# Patient Record
Sex: Female | Born: 1965
Health system: Southern US, Community
[De-identification: ages and names within clinical notes are randomized; demographics above are authoritative.]

## PROBLEM LIST (undated history)

## (undated) DIAGNOSIS — Z8489 Family history of other specified conditions: Secondary | ICD-10-CM

## (undated) DIAGNOSIS — Z9889 Other specified postprocedural states: Secondary | ICD-10-CM

## (undated) DIAGNOSIS — E1165 Type 2 diabetes mellitus with hyperglycemia: Secondary | ICD-10-CM

## (undated) DIAGNOSIS — J302 Other seasonal allergic rhinitis: Secondary | ICD-10-CM

## (undated) DIAGNOSIS — E118 Type 2 diabetes mellitus with unspecified complications: Principal | ICD-10-CM

## (undated) DIAGNOSIS — R112 Nausea with vomiting, unspecified: Secondary | ICD-10-CM

## (undated) DIAGNOSIS — E669 Obesity, unspecified: Secondary | ICD-10-CM

## (undated) DIAGNOSIS — L309 Dermatitis, unspecified: Secondary | ICD-10-CM

## (undated) DIAGNOSIS — Z86718 Personal history of other venous thrombosis and embolism: Secondary | ICD-10-CM

## (undated) DIAGNOSIS — M2540 Effusion, unspecified joint: Secondary | ICD-10-CM

## (undated) DIAGNOSIS — I10 Essential (primary) hypertension: Secondary | ICD-10-CM

## (undated) DIAGNOSIS — R05 Cough: Secondary | ICD-10-CM

## (undated) DIAGNOSIS — M2550 Pain in unspecified joint: Secondary | ICD-10-CM

## (undated) DIAGNOSIS — R059 Cough, unspecified: Secondary | ICD-10-CM

## (undated) DIAGNOSIS — K219 Gastro-esophageal reflux disease without esophagitis: Secondary | ICD-10-CM

## (undated) HISTORY — DX: Type 2 diabetes mellitus with unspecified complications: E11.8

## (undated) HISTORY — PX: CHOLECYSTECTOMY: SHX55

## (undated) HISTORY — DX: Gastro-esophageal reflux disease without esophagitis: K21.9

## (undated) HISTORY — DX: Obesity, unspecified: E66.9

## (undated) HISTORY — DX: Type 2 diabetes mellitus with hyperglycemia: E11.65

## (undated) HISTORY — DX: Essential (primary) hypertension: I10

## (undated) HISTORY — PX: ABLATION: SHX5711

---

## 1999-09-08 ENCOUNTER — Ambulatory Visit (HOSPITAL_COMMUNITY): Admission: RE | Admit: 1999-09-08 | Discharge: 1999-09-08 | Payer: Self-pay | Admitting: *Deleted

## 2000-04-30 ENCOUNTER — Other Ambulatory Visit: Admission: RE | Admit: 2000-04-30 | Discharge: 2000-04-30 | Payer: Self-pay | Admitting: Obstetrics and Gynecology

## 2001-05-20 ENCOUNTER — Ambulatory Visit (HOSPITAL_COMMUNITY): Admission: RE | Admit: 2001-05-20 | Discharge: 2001-05-20 | Payer: Self-pay | Admitting: Internal Medicine

## 2001-07-08 ENCOUNTER — Other Ambulatory Visit: Admission: RE | Admit: 2001-07-08 | Discharge: 2001-07-08 | Payer: Self-pay | Admitting: *Deleted

## 2001-10-26 ENCOUNTER — Encounter (INDEPENDENT_AMBULATORY_CARE_PROVIDER_SITE_OTHER): Payer: Self-pay

## 2001-10-26 ENCOUNTER — Ambulatory Visit (HOSPITAL_COMMUNITY): Admission: RE | Admit: 2001-10-26 | Discharge: 2001-10-26 | Payer: Self-pay | Admitting: *Deleted

## 2002-01-19 HISTORY — PX: TUBAL LIGATION: SHX77

## 2003-10-05 ENCOUNTER — Ambulatory Visit: Payer: Self-pay | Admitting: Family Medicine

## 2003-10-16 ENCOUNTER — Ambulatory Visit: Payer: Self-pay | Admitting: Sports Medicine

## 2003-12-24 ENCOUNTER — Encounter (INDEPENDENT_AMBULATORY_CARE_PROVIDER_SITE_OTHER): Payer: Self-pay | Admitting: *Deleted

## 2003-12-24 LAB — CONVERTED CEMR LAB

## 2004-01-02 ENCOUNTER — Ambulatory Visit: Payer: Self-pay | Admitting: Family Medicine

## 2004-01-10 ENCOUNTER — Ambulatory Visit: Payer: Self-pay | Admitting: Gastroenterology

## 2004-01-11 ENCOUNTER — Encounter: Admission: RE | Admit: 2004-01-11 | Discharge: 2004-01-11 | Payer: Self-pay | Admitting: Gastroenterology

## 2004-01-24 ENCOUNTER — Encounter (INDEPENDENT_AMBULATORY_CARE_PROVIDER_SITE_OTHER): Payer: Self-pay | Admitting: *Deleted

## 2004-01-24 ENCOUNTER — Ambulatory Visit: Payer: Self-pay | Admitting: Gastroenterology

## 2004-01-24 ENCOUNTER — Ambulatory Visit (HOSPITAL_COMMUNITY): Admission: RE | Admit: 2004-01-24 | Discharge: 2004-01-24 | Payer: Self-pay | Admitting: Gastroenterology

## 2004-01-25 ENCOUNTER — Ambulatory Visit: Payer: Self-pay | Admitting: Family Medicine

## 2004-02-06 ENCOUNTER — Encounter (INDEPENDENT_AMBULATORY_CARE_PROVIDER_SITE_OTHER): Payer: Self-pay | Admitting: *Deleted

## 2004-02-06 ENCOUNTER — Inpatient Hospital Stay (HOSPITAL_COMMUNITY): Admission: RE | Admit: 2004-02-06 | Discharge: 2004-02-08 | Payer: Self-pay | Admitting: General Surgery

## 2004-03-19 ENCOUNTER — Ambulatory Visit: Payer: Self-pay | Admitting: Family Medicine

## 2004-04-06 ENCOUNTER — Observation Stay (HOSPITAL_COMMUNITY): Admission: EM | Admit: 2004-04-06 | Discharge: 2004-04-07 | Payer: Self-pay | Admitting: Emergency Medicine

## 2004-04-06 ENCOUNTER — Ambulatory Visit: Payer: Self-pay | Admitting: Gastroenterology

## 2004-04-16 ENCOUNTER — Ambulatory Visit: Payer: Self-pay | Admitting: Family Medicine

## 2004-04-30 ENCOUNTER — Ambulatory Visit: Payer: Self-pay | Admitting: Gastroenterology

## 2004-06-04 ENCOUNTER — Ambulatory Visit: Payer: Self-pay | Admitting: Family Medicine

## 2004-07-25 ENCOUNTER — Ambulatory Visit: Payer: Self-pay | Admitting: Family Medicine

## 2004-07-29 ENCOUNTER — Ambulatory Visit (HOSPITAL_COMMUNITY): Admission: RE | Admit: 2004-07-29 | Discharge: 2004-07-29 | Payer: Self-pay | Admitting: Sports Medicine

## 2004-08-20 ENCOUNTER — Ambulatory Visit: Payer: Self-pay | Admitting: Sports Medicine

## 2004-11-21 ENCOUNTER — Ambulatory Visit: Payer: Self-pay | Admitting: Family Medicine

## 2005-02-02 ENCOUNTER — Ambulatory Visit: Payer: Self-pay | Admitting: Family Medicine

## 2005-02-06 ENCOUNTER — Ambulatory Visit: Payer: Self-pay | Admitting: Family Medicine

## 2005-03-11 ENCOUNTER — Emergency Department (HOSPITAL_COMMUNITY): Admission: EM | Admit: 2005-03-11 | Discharge: 2005-03-11 | Payer: Self-pay | Admitting: Family Medicine

## 2005-04-24 ENCOUNTER — Ambulatory Visit: Payer: Self-pay | Admitting: Family Medicine

## 2005-07-10 ENCOUNTER — Ambulatory Visit: Payer: Self-pay | Admitting: Family Medicine

## 2005-09-18 ENCOUNTER — Ambulatory Visit: Payer: Self-pay | Admitting: Family Medicine

## 2005-09-28 ENCOUNTER — Ambulatory Visit: Payer: Self-pay | Admitting: Family Medicine

## 2005-12-15 ENCOUNTER — Ambulatory Visit: Payer: Self-pay | Admitting: Family Medicine

## 2006-01-20 ENCOUNTER — Emergency Department (HOSPITAL_COMMUNITY): Admission: EM | Admit: 2006-01-20 | Discharge: 2006-01-20 | Payer: Self-pay | Admitting: Emergency Medicine

## 2006-03-08 ENCOUNTER — Ambulatory Visit: Payer: Self-pay | Admitting: Family Medicine

## 2006-03-18 DIAGNOSIS — K219 Gastro-esophageal reflux disease without esophagitis: Secondary | ICD-10-CM

## 2006-03-18 DIAGNOSIS — Z6841 Body Mass Index (BMI) 40.0 and over, adult: Secondary | ICD-10-CM

## 2006-03-18 DIAGNOSIS — I10 Essential (primary) hypertension: Secondary | ICD-10-CM

## 2006-03-18 DIAGNOSIS — E669 Obesity, unspecified: Secondary | ICD-10-CM

## 2006-03-18 HISTORY — DX: Gastro-esophageal reflux disease without esophagitis: K21.9

## 2006-03-18 HISTORY — DX: Obesity, unspecified: E66.9

## 2006-03-18 HISTORY — DX: Essential (primary) hypertension: I10

## 2006-03-19 ENCOUNTER — Encounter (INDEPENDENT_AMBULATORY_CARE_PROVIDER_SITE_OTHER): Payer: Self-pay | Admitting: *Deleted

## 2006-05-28 ENCOUNTER — Ambulatory Visit: Payer: Self-pay | Admitting: Family Medicine

## 2006-08-13 ENCOUNTER — Ambulatory Visit: Payer: Self-pay | Admitting: Family Medicine

## 2006-11-08 ENCOUNTER — Telehealth: Payer: Self-pay | Admitting: *Deleted

## 2006-11-10 ENCOUNTER — Ambulatory Visit: Payer: Self-pay | Admitting: Family Medicine

## 2007-02-11 ENCOUNTER — Ambulatory Visit: Payer: Self-pay | Admitting: Family Medicine

## 2007-05-13 ENCOUNTER — Ambulatory Visit: Payer: Self-pay | Admitting: Family Medicine

## 2007-08-12 ENCOUNTER — Ambulatory Visit: Payer: Self-pay | Admitting: Family Medicine

## 2007-11-04 ENCOUNTER — Ambulatory Visit: Payer: Self-pay | Admitting: Family Medicine

## 2008-01-26 ENCOUNTER — Ambulatory Visit: Payer: Self-pay | Admitting: Family Medicine

## 2008-02-06 ENCOUNTER — Encounter: Payer: Self-pay | Admitting: Family Medicine

## 2008-02-06 ENCOUNTER — Ambulatory Visit: Payer: Self-pay | Admitting: Family Medicine

## 2008-02-06 DIAGNOSIS — R7309 Other abnormal glucose: Secondary | ICD-10-CM | POA: Insufficient documentation

## 2008-02-06 LAB — CONVERTED CEMR LAB: Hgb A1c MFr Bld: 10.6 %

## 2008-02-13 ENCOUNTER — Telehealth: Payer: Self-pay | Admitting: *Deleted

## 2008-02-15 LAB — CONVERTED CEMR LAB
ALT: 27 units/L (ref 0–35)
AST: 20 units/L (ref 0–37)
BUN: 8 mg/dL (ref 6–23)
Calcium: 9 mg/dL (ref 8.4–10.5)
Chloride: 104 meq/L (ref 96–112)
Creatinine, Ser: 0.77 mg/dL (ref 0.40–1.20)
HDL: 42 mg/dL (ref >39)
Total Bilirubin: 0.4 mg/dL (ref 0.3–1.2)
Total CHOL/HDL Ratio: 3.5
VLDL: 23 mg/dL (ref 0–40)

## 2008-03-22 ENCOUNTER — Ambulatory Visit: Payer: Self-pay | Admitting: Family Medicine

## 2008-03-22 ENCOUNTER — Encounter: Payer: Self-pay | Admitting: Family Medicine

## 2008-04-20 ENCOUNTER — Ambulatory Visit: Payer: Self-pay | Admitting: Family Medicine

## 2008-07-13 ENCOUNTER — Ambulatory Visit: Payer: Self-pay | Admitting: Family Medicine

## 2008-07-13 DIAGNOSIS — R300 Dysuria: Secondary | ICD-10-CM | POA: Insufficient documentation

## 2008-07-13 LAB — CONVERTED CEMR LAB
Bilirubin Urine: NEGATIVE
Glucose, Urine, Semiquant: NEGATIVE
Urobilinogen, UA: 0.2
WBC Urine, dipstick: NEGATIVE
pH: 6

## 2008-08-15 ENCOUNTER — Ambulatory Visit: Payer: Self-pay | Admitting: Family Medicine

## 2008-08-15 ENCOUNTER — Encounter: Payer: Self-pay | Admitting: Family Medicine

## 2008-08-20 ENCOUNTER — Telehealth: Payer: Self-pay | Admitting: *Deleted

## 2008-08-20 LAB — CONVERTED CEMR LAB
CO2: 20 meq/L (ref 19–32)
Calcium: 9.2 mg/dL (ref 8.4–10.5)
Creatinine, Ser: 0.92 mg/dL (ref 0.40–1.20)
Glucose, Bld: 366 mg/dL — ABNORMAL HIGH (ref 70–99)

## 2008-08-21 ENCOUNTER — Encounter: Payer: Self-pay | Admitting: Family Medicine

## 2008-08-24 ENCOUNTER — Ambulatory Visit: Payer: Self-pay | Admitting: Family Medicine

## 2008-08-24 DIAGNOSIS — E1165 Type 2 diabetes mellitus with hyperglycemia: Secondary | ICD-10-CM | POA: Insufficient documentation

## 2008-08-24 DIAGNOSIS — IMO0002 Reserved for concepts with insufficient information to code with codable children: Secondary | ICD-10-CM

## 2008-08-24 HISTORY — DX: Type 2 diabetes mellitus with hyperglycemia: E11.65

## 2008-08-24 HISTORY — DX: Reserved for concepts with insufficient information to code with codable children: IMO0002

## 2008-08-24 LAB — CONVERTED CEMR LAB

## 2008-08-29 ENCOUNTER — Telehealth: Payer: Self-pay | Admitting: *Deleted

## 2008-10-03 ENCOUNTER — Ambulatory Visit: Payer: Self-pay | Admitting: Family Medicine

## 2009-01-03 ENCOUNTER — Telehealth: Payer: Self-pay | Admitting: *Deleted

## 2009-01-25 ENCOUNTER — Ambulatory Visit: Payer: Self-pay | Admitting: Family Medicine

## 2009-01-25 ENCOUNTER — Encounter: Payer: Self-pay | Admitting: Family Medicine

## 2009-01-25 DIAGNOSIS — R079 Chest pain, unspecified: Secondary | ICD-10-CM | POA: Insufficient documentation

## 2009-01-25 LAB — CONVERTED CEMR LAB
Albumin/Creatinine Ratio, Urine, POC: <30
CO2: 24 meq/L (ref 19–32)
Chloride: 99 meq/L (ref 96–112)
Glucose, Bld: 88 mg/dL
Potassium: 4 meq/L (ref 3.5–5.3)
Sodium: 136 meq/L (ref 135–145)

## 2009-02-26 ENCOUNTER — Telehealth: Payer: Self-pay | Admitting: *Deleted

## 2009-02-27 ENCOUNTER — Encounter: Payer: Self-pay | Admitting: Family Medicine

## 2009-04-15 ENCOUNTER — Ambulatory Visit: Payer: Self-pay | Admitting: Family Medicine

## 2009-06-20 ENCOUNTER — Encounter: Payer: Self-pay | Admitting: *Deleted

## 2009-06-20 ENCOUNTER — Telehealth: Payer: Self-pay | Admitting: *Deleted

## 2009-06-27 ENCOUNTER — Ambulatory Visit: Payer: Self-pay | Admitting: Family Medicine

## 2009-06-27 LAB — CONVERTED CEMR LAB: Hgb A1c MFr Bld: 6.7 %

## 2009-07-12 ENCOUNTER — Ambulatory Visit: Payer: Self-pay | Admitting: Family Medicine

## 2009-10-07 ENCOUNTER — Encounter: Payer: Self-pay | Admitting: Family Medicine

## 2009-10-07 ENCOUNTER — Ambulatory Visit: Payer: Self-pay | Admitting: Family Medicine

## 2009-10-07 DIAGNOSIS — R5383 Other fatigue: Secondary | ICD-10-CM

## 2009-10-07 DIAGNOSIS — R5381 Other malaise: Secondary | ICD-10-CM | POA: Insufficient documentation

## 2009-10-07 LAB — CONVERTED CEMR LAB
Albumin/Creatinine Ratio, Urine, POC: <30
Creatinine,U: 100 mg/dL
Hgb A1c MFr Bld: 6.8 %
Microalbumin U total vol: 10 mg/L

## 2009-10-09 LAB — CONVERTED CEMR LAB: TSH: 1.858 microintl units/mL (ref 0.350–4.500)

## 2009-12-31 ENCOUNTER — Ambulatory Visit: Payer: Self-pay | Admitting: Family Medicine

## 2009-12-31 ENCOUNTER — Encounter: Payer: Self-pay | Admitting: Family Medicine

## 2009-12-31 LAB — CONVERTED CEMR LAB
BUN: 10 mg/dL (ref 6–23)
CO2: 24 meq/L (ref 19–32)
Creatinine, Ser: 0.9 mg/dL (ref 0.40–1.20)
Glucose, Bld: 107 mg/dL — ABNORMAL HIGH (ref 70–99)
Sodium: 137 meq/L (ref 135–145)
Total Bilirubin: 0.3 mg/dL (ref 0.3–1.2)
Total Protein: 6.8 g/dL (ref 6.0–8.3)

## 2010-02-04 ENCOUNTER — Encounter: Payer: Self-pay | Admitting: *Deleted

## 2010-02-04 ENCOUNTER — Encounter: Payer: Self-pay | Admitting: Sports Medicine

## 2010-02-04 ENCOUNTER — Ambulatory Visit
Admission: RE | Admit: 2010-02-04 | Discharge: 2010-02-04 | Payer: Self-pay | Source: Home / Self Care | Attending: Family Medicine | Admitting: Family Medicine

## 2010-02-04 DIAGNOSIS — J029 Acute pharyngitis, unspecified: Secondary | ICD-10-CM | POA: Insufficient documentation

## 2010-02-04 LAB — CONVERTED CEMR LAB: Rapid Strep: NEGATIVE

## 2010-02-12 ENCOUNTER — Ambulatory Visit: Admission: RE | Admit: 2010-02-12 | Discharge: 2010-02-12 | Payer: Self-pay | Source: Home / Self Care

## 2010-02-20 NOTE — Assessment & Plan Note (Signed)
Summary: sorethroat/feel like its closing/smith/bmc   Vital Signs:  Patient profile:   45 year old female Height:      60 inches Weight:      253.7 pounds BMI:     49.73 Temp:     98.8 degrees F oral Pulse rate:   100 / minute BP sitting:   127 / 85  (left arm) Cuff size:   large  Vitals Entered By: Jimmy Footman, CMA (February 04, 2010 1:39 PM) CC: cold sxs x1week, nausea & ST x2 days Is Patient Diabetic? Yes Did you bring your meter with you today? No   Primary Care Provider:  Antoine Primas DO  CC:  cold sxs x1week and nausea & ST x2 days.  History of Present Illness: URI Symptoms Onset: 2d Description: ST, nausea Symptoms better with phenergan.  NO rashes.  Symptoms Nasal discharge: minimal Fever: No, subjective chills Sore throat: YES, severe Cough: minimal Wheezing: NO Ear pain: NO GI symptoms: Vomiting x3, NB/NB, last ate today. One episode diarrhea.  No abd pain. Sick contacts: son with URI  Red Flags  Stiff neck: NO Dyspnea: NO Rash: NO Swallowing difficulty: NO  Sinusitis Risk Factors Headache/face pain: NO Double sickening: NO tooth pain: NO  Allergy Risk Factors Sneezing: NO Itchy scratchy throat: NO Seasonal symptoms: NO  Flu Risk Factors Headache: NO muscle aches: NO severe fatigue: NO    Habits & Providers  Alcohol-Tobacco-Diet     Tobacco Status: never  Current Medications (verified): 1)  Calcium 600+d 600-400 Mg-Unit Tabs (Calcium Carbonate-Vitamin D) .Marland Kitchen.. 1 Tab By Mouth Two Times A Day 2)  Lisinopril-Hydrochlorothiazide 20-25 Mg Tabs (Lisinopril-Hydrochlorothiazide) .... Take 1 Pill By Mouth Daily 3)  Lancets  Misc (Lancets) .... Check Blood Sugar Two Times A Day 4)  Onetouch Basic System W/device Kit (Blood Glucose Monitoring Suppl) .... Check Blood Sugar Two Times A Day 5)  Omeprazole 20 Mg Cpdr (Omeprazole) .... Take 1 Tab By Mouth Once Daily 6)  Fexofenadine Hcl 60 Mg Tabs (Fexofenadine Hcl) .... Take 1 Tab By Mouth Daily 7)   Metformin Hcl 1000 Mg Tabs (Metformin Hcl) .... Take 1 Tab By Mouth Two Times A Day 8)  Glipizide 5 Mg Xr24h-Tab (Glipizide) .... Take 1 Tab Daily 9)  Promethazine Hcl 25 Mg Tabs (Promethazine Hcl) .... Take 1 Tab By Mouth Q 8 Hrs As Needed For Nausea  Allergies (verified): No Known Drug Allergies  Review of Systems       See HPI  Physical Exam  General:  Well-developed,well-nourished,in no acute distress; alert,appropriate and cooperative throughout examination Head:  Normocephalic and atraumatic without obvious abnormalities. No apparent alopecia or balding. Eyes:  No corneal or conjunctival inflammation noted. EOMI. Perrl Ears:  External ear exam shows no significant lesions or deformities.  Otoscopic examination reveals clear canals, tympanic membranes are intact bilaterally without bulging, retraction, inflammation or discharge. Hearing is grossly normal bilaterally. Nose:  External nasal examination shows no deformity or inflammation. Nasal mucosa are pink and moist without lesions or exudates. Mouth:  Oropharynx markedly erythematous with swollen tonsils without exudate. Neck:  No deformities, masses, or tenderness noted. Lungs:  Normal respiratory effort, chest expands symmetrically. Lungs are clear to auscultation, no crackles or wheezes. Heart:  Normal rate and regular rhythm. S1 and S2 normal without gallop, murmur, click, rub or other extra sounds. Abdomen:  Bowel sounds positive,abdomen soft and non-tender without masses, organomegaly or hernias noted.   Impression & Recommendations:  Problem # 1:  SORE THROAT (ICD-462) Assessment  New Viral syndrome with neg strep. Toradol 30 in office. Pulse initially 120 but down to 100. Home care handout given. Phenergan/Zofran for nausea. Push by mouth fluids. Mobic for pain.  Orders: Rapid Strep-[facility] ([address]) [facility]- Est  Level 4 ([address])  Her updated medication list for this problem includes:    Mobic 15 Mg Tabs (Meloxicam)  ..... One tab by mouth daily for throat pain.  Complete Medication List: 1)  Calcium 600+d 600-400 Mg-unit Tabs (Calcium carbonate-vitamin d) .Marland Kitchen.. 1 tab by mouth two times a day 2)  Lisinopril-hydrochlorothiazide 20-25 Mg Tabs (Lisinopril-hydrochlorothiazide) .... Take 1 pill by mouth daily 3)  Lancets Misc (Lancets) .... Check blood sugar two times a day 4)  Onetouch Basic System W/device Kit (Blood glucose monitoring suppl) .... Check blood sugar two times a day 5)  Omeprazole 20 Mg Cpdr (Omeprazole) .... Take 1 tab by mouth once daily 6)  Fexofenadine Hcl 60 Mg Tabs (Fexofenadine hcl) .... Take 1 tab by mouth daily 7)  Metformin Hcl 1000 Mg Tabs (Metformin hcl) .... Take 1 tab by mouth two times a day 8)  Glipizide 5 Mg Xr24h-tab (Glipizide) .... Take 1 tab daily 9)  Promethazine Hcl 25 Mg Tabs (Promethazine hcl) .... Take 1 tab by mouth q 8 hrs as needed for nausea 10)  Mobic 15 Mg Tabs (Meloxicam) .... One tab by mouth daily for throat pain. 11)  Zofran Odt 8 Mg Tbdp (Ondansetron) .... One tab by mouth q8h as needed for nausea  Patient Instructions: 1)  Mobic for pain. 2)  Zofran and phenergan for nausea. 3)  -Dr. Karie Schwalbe. Prescriptions: ZOFRAN ODT 8 MG TBDP (ONDANSETRON) One tab by mouth q8h as needed for nausea  #20 x 0   Entered and Authorized by:   Rodney Langton MD   Signed by:   Rodney Langton MD on 02/04/2010   Method used:   Electronically to        [facility] [phone]* (retail)       [address]       [place], [place]  [address]       Ph: [phone]       Fax: [phone]   RxID:   [id number] MOBIC 15 MG TABS (MELOXICAM) One tab by mouth daily for throat pain.  #30 x 0   Entered and Authorized by:   Rodney Langton MD   Signed by:   Rodney Langton MD on 02/04/2010   Method used:   Electronically to        [facility] [phone]* (retail)       [address]       [place], [place]  [address]       Ph: [phone]       Fax:  [phone]   RxID:   [id number]    Orders Added: 1)  Rapid Strep-[facility] [[address]] 2)  [facility]- Est  Level 4 [35573]    Laboratory Results  Date/Time Received: February 04, 2010 1:42 PM  Date/Time Reported: February 04, 2010 1:54 PM   Other Tests  Rapid Strep: negative Comments: ...............test performed by......Marland KitchenBonnie A. [place]az[place], MLS (ASCP)cm    Appended Document: sorethroat/feel like its closing/smith/bmc   Medication Administration  Injection # 1:    Medication: Ketorolac-Toradol 15mg     Diagnosis: SORE THROAT (ICD-462)    Route: IM    Site: R deltoid    Exp Date: 05/20/2011    Lot #: [id number]    Mfr: [email or web address]    Patient tolerated injection  without complications    Given by: Jimmy Footman, CMA (February 04, 2010 4:26 PM)  Orders Added: 1)  Ketorolac-Toradol 15mg  [F8101]

## 2010-02-20 NOTE — Assessment & Plan Note (Signed)
Summary: depo inj,tcb  Nurse Visit   Allergies: No Known Drug Allergies  Medication Administration  Injection # 1:    Medication: Depo-Provera 150mg     Diagnosis: CONTRACEPTIVE MANAGEMENT (ICD-V25.09)    Route: IM    Site: R deltoid    Exp Date: 02/2012    Lot #: [id number]    Mfr: [email or web address]    Comments: next depo due Sept 9 thru Sept 23, 2011    Patient tolerated injection without complications    Given by: Theresia Lo RN (July 12, 2009 3:29 PM)  Orders Added: 1)  Depo-Provera 150mg  [J1055] 2)  Admin of Injection (IM/SQ) [04540]   Medication Administration  Injection # 1:    Medication: Depo-Provera 150mg     Diagnosis: CONTRACEPTIVE MANAGEMENT (ICD-V25.09)    Route: IM    Site: R deltoid    Exp Date: 02/2012    Lot #: [id number]    Mfr: [email or web address]    Comments: next depo due Sept 9 thru Sept 23, 2011    Patient tolerated injection without complications    Given by: Theresia Lo RN (July 12, 2009 3:29 PM)  Orders Added: 1)  Depo-Provera 150mg  [J1055] 2)  Admin of Injection (IM/SQ) [47829]

## 2010-02-20 NOTE — Letter (Signed)
Summary: Out of Work  [facility]  [address]   [place], [place] [address]   Phone: [phone]  Fax: [phone]    February 04, 2010   Employee:  Margarit A Chura    To Whom It May Concern:   For Medical reasons, please excuse the above named employee from work for the following dates:  February 04, 2010  If you need additional information, please feel free to contact our office.         Sincerely,    Jimmy Footman, CMA

## 2010-02-20 NOTE — Letter (Signed)
Summary: Generic Letter  [facility]  [address]   [place], [place] [address]   Phone: [phone]  Fax: [phone]    06/20/2009  Janeil Stief [address] [place], [place]  [address]  Dear Ms. Berne,  I have been unable to reach you by phone. We need to get you in to see your md. it has been 6 months. when you have diabtes, we usually see you every 3 months. I refilled the bp med for 2 weeks only. Please call asap to make an appt.         Sincerely,   Golden Circle RN

## 2010-02-20 NOTE — Assessment & Plan Note (Signed)
Summary: f/u DM, HTN, obese, contraception   Vital Signs:  Patient profile:   45 year old female Height:      60 inches Weight:      265 pounds BMI:     51.94 BSA:     2.11 Temp:     98.5 degrees F Pulse rate:   105 / minute BP sitting:   127 / 84  Vitals Entered By: Jone Baseman CMA (June 27, 2009 4:25 PM) [facility]: f/u Is Patient Diabetic? Yes Did you bring your meter with you today? No Pain Assessment Patient in pain? no        Primary Care Provider:  Antoine Primas DO  CC:  f/u.  History of Present Illness: Pt is here for follow up on diabetes.   Pt brought her blood sugar monitor and the vast majority of the readings have been around 80's to 160's.  Pt states the last two weeks has been having more sweets and not watching her diet as much due to celebrating her son graduating from high school.  Having mild gi side effect s from the medications but tolerating them well.   Pt BP is well controlled and she deies any headaches, abdominal pain   Pt weight was addressed that she has gained 3 #.  Pt states she has started to walk but is doing it sporadicallyand for only a total of 15- 20 minutes walks what seems to be a little over a half mile.    Pt is also here for her Depo shot.  Pt states that she likes her depo and does not want to switch to another contraception at this moment. Pt told of other alternatives that would be better for her weight and bone health  Pt also complains of runny nose happens seasonally, this time of the year seems to be worse, right now having a little left ear pain as well, feels that there is fluid in them   Pt also states she is having a little more heartburn recently, used to be on a medication that helped and would like it again  Habits & Providers  Alcohol-Tobacco-Diet     Tobacco Status: never  Current Medications (verified): 1)  Calcium 600+d 600-400 Mg-Unit Tabs (Calcium Carbonate-Vitamin D) .Marland Kitchen.. 1 Tab By Mouth Two Times A Day 2)   Lisinopril-Hydrochlorothiazide 20-25 Mg Tabs (Lisinopril-Hydrochlorothiazide) .... Take 1 Pill By Mouth Daily 3)  Metformin Hcl 850 Mg Tabs (Metformin Hcl) .... Take 1 Tab By Mouth Two Times A Day 4)  Glyburide 5 Mg Tabs (Glyburide) .... Take 1 Tab By Conley Simmonds Time Daily 5)  Lancets  Misc (Lancets) .... Check Blood Sugar Two Times A Day 6)  Onetouch Basic System W/device Kit (Blood Glucose Monitoring Suppl) .... Check Blood Sugar Two Times A Day 7)  Omeprazole 20 Mg Cpdr (Omeprazole) .... Take 1 Tab By Mouth Once Daily 8)  Fexofenadine Hcl 60 Mg Tabs (Fexofenadine Hcl) .... Take 1 Tab By Mouth Daily  Allergies (verified): No Known Drug Allergies  Past History:  Past medical, surgical, family and social histories (including risk factors) reviewed, and no changes noted (except as noted below).  Past Medical History: Reviewed history from 03/18/2006 and no changes required. Hyperglycemia 3/06 [facility] inpatient, MRSA  Past Surgical History: Reviewed history from 03/18/2006 and no changes required. BTL -2002 - 08/04/2004, Cholecystectomy - 01/20/2004, ERCP- CBD stone, S/P Balloon sweeps with N cholangiogram - 04/11/2004, Serum glucose -163,  Hb Aic 6.6 - 08/04/2004, TV US:  0.9 cm myometrial fundal fibroid - 08/04/2004  Family History: Reviewed history from 08/24/2008 and no changes required. Breast CA 1st degree, Colon Ca - paternal grandfather, HTN - parents  Social History: Reviewed history from 08/24/2008 and no changes required. Single. Lives with 69 y/o son. working. No smoking. Occ. Alcohol use. No STDs nor Iv drug use. Fast-food diet only sporadic exercise  Review of Systems       denies fever, chills, nausea, vomiting, diarrhea or constipation   Physical Exam  General:  Well-developed,well-nourished,in no acute distress; alert,appropriate and cooperative throughout examination Eyes:  No corneal or conjunctival inflammation noted. EOMI. Perrla.  Ears:  mild fluid level seen behind  left TM no erythema no bulging or retraction Nose:  mild blue hue to turbinates Mouth:  MMM Lungs:  Normal respiratory effort, chest expands symmetrically. Lungs are clear to auscultation, no crackles or wheezes. Heart:  Normal rate and regular rhythm. S1 and S2 normal without gallop, murmur, click, rub or other extra sounds. Abdomen:  Bowel sounds positive,abdomen soft and non-tender without masses, organomegaly or hernias noted. Extremities:  trace edema Le b/l Neurologic:  NVI in all extremities Skin:  nno rash  Diabetes Management Exam:    Foot Exam (with socks and/or shoes not present):       Sensory-Pinprick/Light touch:          Left medial foot (L-4): normal          Left dorsal foot (L-5): normal          Left lateral foot (S-1): normal          Right medial foot (L-4): normal          Right dorsal foot (L-5): normal          Right lateral foot (S-1): normal       Sensory-Monofilament:          Left foot: normal          Right foot: normal       Inspection:          Left foot: normal          Right foot: normal       Nails:          Left foot: normal          Right foot: normal    Eye Exam:       Eye Exam done elsewhere          Date: 06/19/2009          Results: normal          Done by: optho   Impression & Recommendations:  Problem # 1:  DIAB W/UNSPEC COMP TYPE II/UNSPEC TYPE UNCNTRL (ICD-250.92) Assessment Unchanged Pt did have mild elevation in A1C, consider incresing metformin to 1000mg  two times a day but pt states she will do better and would like to avoid more gi side effects.  Will see again in 3 months.  Pt was seen by optho and will see a podiatrist Her updated medication list for this problem includes:    Lisinopril-hydrochlorothiazide 20-25 Mg Tabs (Lisinopril-hydrochlorothiazide) .Marland Kitchen... Take 1 pill by mouth daily    Metformin Hcl 850 Mg Tabs (Metformin hcl) .Marland Kitchen... Take 1 tab by mouth two times a day    Glyburide 5 Mg Tabs (Glyburide) .Marland Kitchen... Take 1 tab by  mouth1 time daily  Orders: A1C-[facility] ([address]) [facility]- Est  Level 4 ([address])  Problem # 2:  OBESITY, NOS (ICD-278.00) Still  gaining weight, want to increase activity because pt could not pick any foods she wanted to take out of her diet.  Pt states she will do better, made aware Depo is also making this more difficult.  Goal to lose 5# by next visit in 3 months. Orders: [facility]- Est  Level 4 ([address])  Problem # 3:  HYPERTENSION, BENIGN SYSTEMIC (ICD-401.1) Assessment: Unchanged Pt at goal will continue current tx.  Will need labwork done at next appointment.  Likely should get lipids as well.  Her updated medication list for this problem includes:    Lisinopril-hydrochlorothiazide 20-25 Mg Tabs (Lisinopril-hydrochlorothiazide) .Marland Kitchen... Take 1 pill by mouth daily  Orders: [facility]- Est  Level 4 ([address])  Problem # 4:  CONTRACEPTIVE MANAGEMENT (ICD-V25.09) Assessment: Comment Only Pt given many alternatives, she likes the idea of not having periods, will reasses at next visit.  Pt given all risks of long term tx of depo.  Should get Vit D level at next visit and consider early Dexa Scan.    Problem # 5:  GASTROESOPHAGEAL REFLUX, NO ESOPHAGITIS (ICD-530.81) Assessment: Deteriorated will start omeprazole, f/u in 3 months if continue jwill check for H pylori Her updated medication list for this problem includes:    Omeprazole 20 Mg Cpdr (Omeprazole) .Marland Kitchen... Take 1 tab by mouth once daily  Complete Medication List: 1)  Calcium 600+d 600-400 Mg-unit Tabs (Calcium carbonate-vitamin d) .Marland Kitchen.. 1 tab by mouth two times a day 2)  Lisinopril-hydrochlorothiazide 20-25 Mg Tabs (Lisinopril-hydrochlorothiazide) .... Take 1 pill by mouth daily 3)  Metformin Hcl 850 Mg Tabs (Metformin hcl) .... Take 1 tab by mouth two times a day 4)  Glyburide 5 Mg Tabs (Glyburide) .... Take 1 tab by mouth1 time daily 5)  Lancets Misc (Lancets) .... Check blood sugar two times a day 6)  Onetouch Basic System W/device Kit (Blood glucose  monitoring suppl) .... Check blood sugar two times a day 7)  Omeprazole 20 Mg Cpdr (Omeprazole) .... Take 1 tab by mouth once daily 8)  Fexofenadine Hcl 60 Mg Tabs (Fexofenadine hcl) .... Take 1 tab by mouth daily  Patient Instructions: 1)  You are doing great A1C was 6.7%! 2)  I need to see you again in 3 months 3)  All medications were refilled Prescriptions: FEXOFENADINE HCL 60 MG TABS (FEXOFENADINE HCL) take 1 tab by mouth daily  #96 x 3   Entered and Authorized by:   Antoine Primas DO   Signed by:   Antoine Primas DO on 06/27/2009   Method used:   Electronically to        [facility] [phone]* (retail)       [address]       [place], [place]  [address]       Ph: [phone]       Fax: [phone]   RxID:   [id number] OMEPRAZOLE 20 MG CPDR (OMEPRAZOLE) take 1 tab by mouth once daily  #96 x 3   Entered and Authorized by:   Antoine Primas DO   Signed by:   Antoine Primas DO on 06/27/2009   Method used:   Electronically to        [facility] [phone]* (retail)       [address]       [place], [place]  [address]       Ph: [phone]       Fax: [phone]   RxID:   [id number] LANCETS  MISC (LANCETS) check blood sugar two times  a day  #1box x 5   Entered and Authorized by:   Antoine Primas DO   Signed by:   Antoine Primas DO on 06/27/2009   Method used:   Electronically to        [facility] [phone]* (retail)       [address]       [place], [place]  [address]       Ph: [phone]       Fax: [phone]   RxID:   [id number] GLYBURIDE 5 MG TABS (GLYBURIDE) take 1 tab by mouth1 time daily  #96 x 3   Entered and Authorized by:   Antoine Primas DO   Signed by:   Antoine Primas DO on 06/27/2009   Method used:   Electronically to        [facility] [phone]* (retail)       [address]       [place], [place]  [address]       Ph: [phone]       Fax: [phone]   RxID:   [id number] METFORMIN HCL 850 MG TABS (METFORMIN HCL)  take 1 tab by mouth two times a day  #192 x 3   Entered and Authorized by:   Antoine Primas DO   Signed by:   Antoine Primas DO on 06/27/2009   Method used:   Electronically to        [facility] [phone]* (retail)       [address]       [place], [place]  [address]       Ph: [phone]       Fax: [phone]   RxID:   [id number] LISINOPRIL-HYDROCHLOROTHIAZIDE 20-25 MG TABS (LISINOPRIL-HYDROCHLOROTHIAZIDE) Take 1 pill by mouth daily  #96 x 3   Entered and Authorized by:   Antoine Primas DO   Signed by:   Antoine Primas DO on 06/27/2009   Method used:   Electronically to        [facility] [phone]* (retail)       [address]       [place], [place]  [address]       Ph: [phone]       Fax: [phone]   RxID:   [id number]   Laboratory Results   Blood Tests   Date/Time Received: June 27, 2009 4:18 PM  Date/Time Reported: June 27, 2009 4:40 PM   HGBA1C: 6.7%   (Normal Range: Non-Diabetic - 3-6%   Control Diabetic - 6-8%)  Comments: ...............test performed by......Marland KitchenBonnie A. [place], MLS (ASCP)cm      Prevention & Chronic Care Immunizations   Influenza vaccine: Not documented   Influenza vaccine due: 09/19/2009    Tetanus booster: 07/24/2004: Done.   Tetanus booster due: 07/25/2014    Pneumococcal vaccine: Not documented  Other Screening   Pap smear: normal  (02/06/2008)   Pap smear due: 02/05/2010    Mammogram: Not documented   Smoking status: never  (06/27/2009)  Diabetes Mellitus   HgbA1C: 6.7  (06/27/2009)   Hemoglobin A1C due: 05/06/2008    Eye exam: normal  (06/19/2009)   Eye exam due: 06/2010    Foot exam: yes  (06/27/2009)   High risk foot: Not documented   Foot care education: Not documented    Urine microalbumin/creatinine ratio: Not documented    Diabetes flowsheet reviewed?: Yes   Progress toward A1C goal: At goal    Stage of readiness to change (  diabetes management): Maintenance   Diabetes comments: goal  is <7  Lipids   Total Cholesterol: 146  (02/06/2008)   LDL: 81  (02/06/2008)   LDL Direct: 84  (01/25/2009)   HDL: 42  (02/06/2008)   Triglycerides: 114  (02/06/2008)   Lipid panel due: 12/27/2009  Hypertension   Last Blood Pressure: 127 / 84  (06/27/2009)   Serum creatinine: 0.80  (01/25/2009)   Serum potassium 4.0  (01/25/2009)    Hypertension flowsheet reviewed?: Yes   Progress toward BP goal: At goal    Stage of readiness to change (hypertension management): Maintenance  Self-Management Support :    Diabetes self-management support: Copy of home glucose meter record, Pre-printed educational material, Referred for self-management class  (06/27/2009)    Hypertension self-management support: BP self-monitoring log, Written self-care plan, Pre-printed educational material  (06/27/2009)   Hypertension self-care plan printed.

## 2010-02-20 NOTE — Assessment & Plan Note (Signed)
Summary: f/u diabetes   Vital Signs:  Patient profile:   45 year old female Height:      60 inches Weight:      262.6 pounds BMI:     51.47 Temp:     98.2 degrees F oral Pulse rate:   99 / minute BP sitting:   114 / 77  (right arm) Cuff size:   large  Vitals Entered By: Garen Grams LPN (January 25, 2009 2:33 PM) CC: f/u dm, Hypertension Management Is Patient Diabetic? Yes Did you bring your meter with you today? No Pain Assessment Patient in pain? no        Primary Care Provider:  Antoine Primas DO  CC:  f/u dm and Hypertension Management.  History of Present Illness: Pt is here for 6 month follow up on new onset of diabetes.   Pt brought her blood sugar monitor and the vast majority of the readings have been around 89-111 with only a few outliers above 140 none above 150. Pt is very happy with her progress and is not having any gi side effect s from the medications.  Pt BP is well controlled and she deies any headaches.  Pt weight was addressed that she has gained 1 #.  Pt states she has started to walk but is doing it sporadicallyand for only a total of 15 minutes.    Pt is also here for her Depo shot.  Pt states that she likes her depo and does not want to switch to another contraception at this moment.  Pt also complains of runny nose happens seasonally, this time of the year seems to be worse.    Hypertension History:      She denies headache, chest pain, palpitations, and visual symptoms.  She notes no problems with any antihypertensive medication side effects.        Positive major cardiovascular risk factors include diabetes and hypertension.  Negative major cardiovascular risk factors include female age less than 41 years old, no history of hyperlipidemia, negative family history for ischemic heart disease, and non-tobacco-user status.        Further assessment for target organ damage reveals no history of ASHD, cardiac end-organ damage (CHF/LVH), stroke/TIA,  peripheral vascular disease, renal insufficiency, or hypertensive retinopathy.      Habits & Providers  Alcohol-Tobacco-Diet     Tobacco Status: never  Current Medications (verified): 1)  Calcium 600+d 600-400 Mg-Unit Tabs (Calcium Carbonate-Vitamin D) .Marland Kitchen.. 1 Tab By Mouth Two Times A Day 2)  Lisinopril-Hydrochlorothiazide 20-25 Mg Tabs (Lisinopril-Hydrochlorothiazide) .... Take 1 Pill By Mouth Daily 3)  Metformin Hcl 850 Mg Tabs (Metformin Hcl) .... Take 1 Tab By Mouth Two Times A Day 4)  Glyburide 5 Mg Tabs (Glyburide) .... Take 1 Tab By Conley Simmonds Time Daily 5)  Lancets  Misc (Lancets) .... Check Blood Sugar Two Times A Day 6)  Onetouch Basic System W/device Kit (Blood Glucose Monitoring Suppl) .... Check Blood Sugar Two Times A Day  Allergies (verified): No Known Drug Allergies  Past History:  Past medical, surgical, family and social histories (including risk factors) reviewed, and no changes noted (except as noted below).  Past Medical History: Reviewed history from 03/18/2006 and no changes required. Hyperglycemia 3/06 [facility] inpatient, MRSA  Past Surgical History: Reviewed history from 03/18/2006 and no changes required. BTL -2002 - 08/04/2004, Cholecystectomy - 01/20/2004, ERCP- CBD stone, S/P Balloon sweeps with N cholangiogram - 04/11/2004, Serum glucose -163,  Hb Aic 6.6 - 08/04/2004, TV  US: 0.9 cm myometrial fundal fibroid - 08/04/2004  Family History: Reviewed history from 08/24/2008 and no changes required. Breast CA 1st degree, Colon Ca - paternal grandfather, HTN - parents  Social History: Reviewed history from 08/24/2008 and no changes required. Single. Lives with 66  y/o son. Unemployed since Jan 2005. No smoking. Occ. Alcohol use. No STDs nor Iv drug use. Fast-food diet. Walks 30 min 3x/ wk  Review of Systems       denies fever, chills, nausea, vomiting, diarrhea or constipation   Physical Exam  General:  Well-developed,well-nourished,in no acute distress;  alert,appropriate and cooperative throughout examination Nose:  bluish turbinates, possible polyp on right side, non obstructing Lungs:  Normal respiratory effort, chest expands symmetrically. Lungs are clear to auscultation, no crackles or wheezes. Heart:  Normal rate and regular rhythm. S1 and S2 normal without gallop, murmur, click, rub or other extra sounds. Abdomen:  Bowel sounds positive,abdomen soft and non-tender without masses, organomegaly or hernias noted. Neurologic:  No cranial nerve deficits noted.  DTRs are symmetrical throughout. Sensory, motor and coordinative functions appear intact.  Diabetes Management Exam:    Eye Exam:       Eye Exam done elsewhere          Date: 08/02/2008          Results: normal          Done by: dr   Impression & Recommendations:  Problem # 1:  DIAB W/UNSPEC COMP TYPE II/UNSPEC TYPE UNCNTRL (ICD-250.92) Assessment Improved Doing Great!  At goal for A1C.  Decreased glyburide this time, will attempt to stop it if she is doing well next time Her updated medication list for this problem includes:    Lisinopril-hydrochlorothiazide 20-25 Mg Tabs (Lisinopril-hydrochlorothiazide) .Marland Kitchen... Take 1 pill by mouth daily    Metformin Hcl 850 Mg Tabs (Metformin hcl) .Marland Kitchen... Take 1 tab by mouth two times a day    Glyburide 5 Mg Tabs (Glyburide) .Marland Kitchen... Take 1 tab by mouth1 time daily  Orders: A1C-[facility] (13086) UA Microalbumin-[facility] (57846) Basic Met-[facility] (96295-28413) Direct LDL-[facility] (24401-02725) [facility]- Est  Level 4 (36644)  Her updated medication list for this problem includes:    Lisinopril-hydrochlorothiazide 20-25 Mg Tabs (Lisinopril-hydrochlorothiazide) .Marland Kitchen... Take 1 pill by mouth daily    Metformin Hcl 850 Mg Tabs (Metformin hcl) .Marland Kitchen... Take 1 tab by mouth two times a day    Glyburide 5 Mg Tabs (Glyburide) .Marland Kitchen... Take 1 tab by mouth1 time daily  Problem # 2:  OBESITY, NOS (ICD-278.00) Assessment: Unchanged  Orders: [facility]- Est  Level 4 (03474)  Problem # 3:   HYPERTENSION, BENIGN SYSTEMIC (ICD-401.1) At Goal Her updated medication list for this problem includes:    Lisinopril-hydrochlorothiazide 20-25 Mg Tabs (Lisinopril-hydrochlorothiazide) .Marland Kitchen... Take 1 pill by mouth daily  Her updated medication list for this problem includes:    Lisinopril-hydrochlorothiazide 20-25 Mg Tabs (Lisinopril-hydrochlorothiazide) .Marland Kitchen... Take 1 pill by mouth daily  Orders: [facility]- Est  Level 4 (25956)  Problem # 4:  CONTRACEPTIVE MANAGEMENT (ICD-V25.09)  will receive depo  Orders: U Preg-[facility] (38756) Depo-Provera 150mg  (J1055) [facility]- Est  Level 4 (43329)  Complete Medication List: 1)  Calcium 600+d 600-400 Mg-unit Tabs (Calcium carbonate-vitamin d) .Marland Kitchen.. 1 tab by mouth two times a day 2)  Lisinopril-hydrochlorothiazide 20-25 Mg Tabs (Lisinopril-hydrochlorothiazide) .... Take 1 pill by mouth daily 3)  Metformin Hcl 850 Mg Tabs (Metformin hcl) .... Take 1 tab by mouth two times a day 4)  Glyburide 5 Mg Tabs (Glyburide) .... Take 1 tab  by mouth1 time daily 5)  Lancets Misc (Lancets) .... Check blood sugar two times a day 6)  Onetouch Basic System W/device Kit (Blood glucose monitoring suppl) .... Check blood sugar two times a day  Hypertension Assessment/Plan:      The patient's hypertensive risk group is category C: Target organ damage and/or diabetes.  Her calculated 10 year risk of coronary heart disease is 3 %.  Today's blood pressure is 114/77.  Her blood pressure goal is < 130/80.  Education Time: 5  Patient Instructions: 1)  Great to see you 2)  I want you to cut back on your gliburide to 1 pill daily.  If you blood sugar starts to be much higher than 150 I want you to call and start taking two pills daily again. 3)  Continue your other medications as prescribed 4)  It is important that you exercise reguarly at least 20 minutes 5 times a week. If you develop chest pain, have severe difficulty breathing, or feel very tired, stop exercising immediately and seek  medical attention.  5)  I want to draw some labs today 6)  I want to see you again in 3 months to recheck your A1C and probably a complete physical. 7)  Keep up the great work! Prescriptions: GLYBURIDE 5 MG TABS (GLYBURIDE) take 1 tab by mouth1 time daily  #30 x 11   Entered and Authorized by:   Antoine Primas DO   Signed by:   Antoine Primas DO on 01/25/2009   Method used:   Electronically to        [facility] [phone]* (retail)       [address]       [place], [place]  [address]       Ph: [phone]       Fax: [phone]   RxID:   [id number]   Laboratory Results   Urine Tests  Date/Time Received: January 25, 2009 3:13 PM  Date/Time Reported: January 25, 2009 3:20 PM   Microalbumin (urine): 10 mg/L Creatinine: 50mg /dL  A:C Ratio <24 Normal Urine HCG: negative Comments: ...........test performed by...........Marland KitchenTerese Door, CMA    Blood Tests   Date/Time Received: January 25, 2009 2:25 PM  Date/Time Reported: January 25, 2009 2:48 PM   Glucose (random): 88 mg/dL   (Normal Range: 40-102) HGBA1C: 6.3%   (Normal Range: Non-Diabetic - 3-6%   Control Diabetic - 6-8%)  Comments: ...........test performed by...........Marland KitchenTerese Door, CMA        Medication Administration  Injection # 1:    Medication: Depo-Provera 150mg     Diagnosis: CONTRACEPTIVE MANAGEMENT (ICD-V25.09)    Route: IM    Site: LUOQ gluteus    Exp Date: 04/20/2011    Lot #: [id number]    Mfr: [email or web address]    Comments: Pt informed to return for next injection March 25 - April 8    Patient tolerated injection without complications    Given by: Jone Baseman CMA (January 25, 2009 4:32 PM)  Orders Added: 1)  A1C-[facility] [83036] 2)  UA Microalbumin-[facility] [82044] 3)  Basic Met-[facility] [44034-74259] 4)  Direct LDL-[facility] [56387-56433] 5)  U Preg-[facility] [81025] 6)  Depo-Provera 150mg  [J1055] 7)  [facility]- Est  Level 4 [29518]

## 2010-02-20 NOTE — Letter (Signed)
Summary: Handout Printed  Printed Handout:  - Viral Infections

## 2010-02-20 NOTE — Assessment & Plan Note (Signed)
Summary: depo inj,tcb  Nurse Visit   Allergies: No Known Drug Allergies  Medication Administration  Injection # 1:    Medication: Depo-Provera 150mg     Diagnosis: CONTRACEPTIVE MANAGEMENT (ICD-V25.09)    Route: IM    Site: L deltoid    Exp Date: 05/2011    Lot #: [id number]    Mfr: [email or web address]    Comments: next depo due June 13 thru  June27    Patient tolerated injection without complications    Given by: Theresia Lo RN (April 15, 2009 9:47 AM)  Orders Added: 1)  Depo-Provera 150mg  [J1055] 2)  Admin of Injection (IM/SQ) [78295]   Medication Administration  Injection # 1:    Medication: Depo-Provera 150mg     Diagnosis: CONTRACEPTIVE MANAGEMENT (ICD-V25.09)    Route: IM    Site: L deltoid    Exp Date: 05/2011    Lot #: [id number]    Mfr: [email or web address]    Comments: next depo due June 13 thru  June27    Patient tolerated injection without complications    Given by: Theresia Lo RN (April 15, 2009 9:47 AM)  Orders Added: 1)  Depo-Provera 150mg  [J1055] 2)  Admin of Injection (IM/SQ) [65784]

## 2010-02-20 NOTE — Assessment & Plan Note (Signed)
Summary: f/u & depo,df   Vital Signs:  Patient profile:   45 year old female Height:      60 inches Weight:      255.1 pounds BMI:     50.00 Temp:     98.9 degrees F oral Pulse rate:   101 / minute BP sitting:   122 / 83  (left arm) Cuff size:   large  Vitals Entered By: Garen Grams LPN (December 31, 2009 8:53 AM) CC: f/u dm Is Patient Diabetic? Yes Did you bring your meter with you today? No Pain Assessment Patient in pain? no        Primary Care Provider:  Antoine Primas DO  CC:  f/u dm.  History of Present Illness: Pt is here for follow up on diabetes.    1. DM  glucose low: 100 Glucose high:130 Last A1C:6.8 Taking Meds:yes metformin, and glipizide  On INsulin:no Side effects:no ROS: Denies polyuria, polydipsia, visual changes numbness in extremities, foot ulcers Lifestyle modifications: walking multiple times a day.    obesity-  has lost another 6 # is trying really hard.  She is walking almost daily.  has been trying to watch what she eats  Pt is also here for her Depo shot.  Pt states that she likes her depo and does not want to switch to another contraception at this moment. Pt told of other alternatives that would be better for her weight and bone health again, still wants shot.   Gerd-  Controlled only some nausea from time to time no vomiting, no hematemsis regual bowel movements   BP today:122/83 Taking Meds:yes Side Effects:no ROS: denies Headache visual changes nausea vomiting abdominal pain numbness in extremities    Habits & Providers  Alcohol-Tobacco-Diet     Tobacco Status: never  Current Medications (verified): 1)  Calcium 600+d 600-400 Mg-Unit Tabs (Calcium Carbonate-Vitamin D) .Marland Kitchen.. 1 Tab By Mouth Two Times A Day 2)  Lisinopril-Hydrochlorothiazide 20-25 Mg Tabs (Lisinopril-Hydrochlorothiazide) .... Take 1 Pill By Mouth Daily 3)  Lancets  Misc (Lancets) .... Check Blood Sugar Two Times A Day 4)  Onetouch Basic System W/device Kit (Blood  Glucose Monitoring Suppl) .... Check Blood Sugar Two Times A Day 5)  Omeprazole 20 Mg Cpdr (Omeprazole) .... Take 1 Tab By Mouth Once Daily 6)  Fexofenadine Hcl 60 Mg Tabs (Fexofenadine Hcl) .... Take 1 Tab By Mouth Daily 7)  Metformin Hcl 1000 Mg Tabs (Metformin Hcl) .... Take 1 Tab By Mouth Two Times A Day 8)  Glipizide 5 Mg Xr24h-Tab (Glipizide) .... Take 1 Tab Daily 9)  Promethazine Hcl 25 Mg Tabs (Promethazine Hcl) .... Take 1 Tab By Mouth Q 8 Hrs As Needed For Nausea  Allergies (verified): No Known Drug Allergies  Past History:  Past medical, surgical, family and social histories (including risk factors) reviewed, and no changes noted (except as noted below).  Past Medical History: Reviewed history from 03/18/2006 and no changes required. Hyperglycemia 3/06 [facility] inpatient, MRSA  Past Surgical History: Reviewed history from 03/18/2006 and no changes required. BTL -2002 - 08/04/2004, Cholecystectomy - 01/20/2004, ERCP- CBD stone, S/P Balloon sweeps with N cholangiogram - 04/11/2004, Serum glucose -163,  Hb Aic 6.6 - 08/04/2004, TV US: 0.9 cm myometrial fundal fibroid - 08/04/2004  Family History: Reviewed history from 08/24/2008 and no changes required. Breast CA 1st degree, Colon Ca - paternal grandfather, HTN - parents  Social History: Reviewed history from 06/27/2009 and no changes required. Single. Lives with 9 y/o son. working.  No smoking. Occ. Alcohol use. No STDs nor Iv drug use. Fast-food diet only sporadic exercise  Review of Systems       see hpi  Physical Exam  General:  Well-developed,well-nourished,in no acute distress; alert,appropriate and cooperative throughout examination Eyes:  No corneal or conjunctival inflammation noted. EOMI. Perrla.  Ears:  mild fluid level seen behind left TM no erythema no bulging or retraction Nose:  mild blue hue to turbinates Mouth:  MMM Lungs:  Normal respiratory effort, chest expands symmetrically. Lungs are clear to  auscultation, no crackles or wheezes. Heart:  Normal rate and regular rhythm. S1 and S2 normal without gallop, murmur, click, rub or other extra sounds. Abdomen:  Bowel sounds positive,abdomen soft and non-tender without masses, organomegaly or hernias noted. Pulses:  2+ Extremities:  no edema, NVI  Neurologic:  NVI in all extremities Skin:  no rash   Impression & Recommendations:  Problem # 1:  DIAB W/UNSPEC COMP TYPE II/UNSPEC TYPE UNCNTRL (ICD-250.92) Pt is  doing well, will get A1C today and adjust accordingly.  Want to see pt again in 3 months if continue jweight loss likely will be removing medications.  Her updated medication list for this problem includes:    Lisinopril-hydrochlorothiazide 20-25 Mg Tabs (Lisinopril-hydrochlorothiazide) .Marland Kitchen... Take 1 pill by mouth daily    Metformin Hcl 1000 Mg Tabs (Metformin hcl) .Marland Kitchen... Take 1 tab by mouth two times a day    Glipizide 5 Mg Xr24h-tab (Glipizide) .Marland Kitchen... Take 1 tab daily  Orders: A1C-[facility] (21308) [facility]- Est  Level 4 (65784)  Problem # 2:  OBESITY, NOS (ICD-278.00) encouraged pt to continue same regimen will make the biggest difference on her health.  Orders: [facility]- Est  Level 4 (69629)  Problem # 3:  HYPERTENSION, BENIGN SYSTEMIC (ICD-401.1) at goal will continue  Her updated medication list for this problem includes:    Lisinopril-hydrochlorothiazide 20-25 Mg Tabs (Lisinopril-hydrochlorothiazide) .Marland Kitchen... Take 1 pill by mouth daily  Orders: [facility]- Est  Level 4 (99214) Comp Met-[facility] (52841-32440)  Complete Medication List: 1)  Calcium 600+d 600-400 Mg-unit Tabs (Calcium carbonate-vitamin d) .Marland Kitchen.. 1 tab by mouth two times a day 2)  Lisinopril-hydrochlorothiazide 20-25 Mg Tabs (Lisinopril-hydrochlorothiazide) .... Take 1 pill by mouth daily 3)  Lancets Misc (Lancets) .... Check blood sugar two times a day 4)  Onetouch Basic System W/device Kit (Blood glucose monitoring suppl) .... Check blood sugar two times a day 5)  Omeprazole 20 Mg  Cpdr (Omeprazole) .... Take 1 tab by mouth once daily 6)  Fexofenadine Hcl 60 Mg Tabs (Fexofenadine hcl) .... Take 1 tab by mouth daily 7)  Metformin Hcl 1000 Mg Tabs (Metformin hcl) .... Take 1 tab by mouth two times a day 8)  Glipizide 5 Mg Xr24h-tab (Glipizide) .... Take 1 tab daily 9)  Promethazine Hcl 25 Mg Tabs (Promethazine hcl) .... Take 1 tab by mouth q 8 hrs as needed for nausea   Orders Added: 1)  A1C-[facility] [83036] 2)  [facility]- Est  Level 4 [99214] 3)  Comp Met-[facility] [10272-53664]    Prevention & Chronic Care Immunizations   Influenza vaccine: Not documented   Influenza vaccine due: 09/19/2009    Tetanus booster: 07/24/2004: Done.   Tetanus booster due: 07/25/2014    Pneumococcal vaccine: Not documented  Other Screening   Pap smear: normal  (02/06/2008)   Pap smear due: 02/05/2010    Mammogram: Not documented   Smoking status: never  (12/31/2009)  Diabetes Mellitus   HgbA1C: 6.8  (10/07/2009)   Hemoglobin A1C  due: 05/06/2008    Eye exam: normal  (06/19/2009)   Eye exam due: 06/2010    Foot exam: yes  (06/27/2009)   High risk foot: Not documented   Foot care education: Not documented    Urine microalbumin/creatinine ratio: Not documented   Urine microalbumin action/deferral: Ordered    Diabetes flowsheet reviewed?: Yes   Progress toward A1C goal: At goal    Stage of readiness to change (diabetes management): Maintenance  Lipids   Total Cholesterol: 146  (02/06/2008)   LDL: 81  (02/06/2008)   LDL Direct: 84  (01/25/2009)   HDL: 42  (02/06/2008)   Triglycerides: 114  (02/06/2008)   Lipid panel due: 12/27/2009  Hypertension   Last Blood Pressure: 122 / 83  (12/31/2009)   Serum creatinine: 0.80  (01/25/2009)   Serum potassium 4.0  (01/25/2009) CMP ordered    Basic metabolic panel due: 01/25/2010    Hypertension flowsheet reviewed?: Yes   Progress toward BP goal: At goal    Stage of readiness to change (hypertension management):  Maintenance  Self-Management Support :   Personal Goals (by the next clinic visit) :     Personal A1C goal: 7  (10/07/2009)     Personal blood pressure goal: 130/80  (10/07/2009)     Personal LDL goal: 100  (10/07/2009)    Diabetes self-management support: Copy of home glucose meter record, Pre-printed educational material, Referred for self-management class  (06/27/2009)    Hypertension self-management support: BP self-monitoring log, Written self-care plan, Pre-printed educational material  (06/27/2009)   Appended Document: A1c  6.4 %    Lab Visit  Laboratory Results   Blood Tests   Date/Time Received: December 31, 2009 8:35 AM  Date/Time Reported: December 31, 2009 9:17 AM   HGBA1C: 6.4%   (Normal Range: Non-Diabetic - 3-6%   Control Diabetic - 6-8%)  Comments: ...............test performed by......Marland KitchenBonnie A. [place], MLS (ASCP)cm    Orders Today:   Appended Document: f/u & depo,df   Medication Administration  Injection # 1:    Medication: Depo-Provera 150mg     Diagnosis: CONTRACEPTIVE MANAGEMENT (ICD-V25.09)    Route: IM    Site: L deltoid    Exp Date: 05/19/2012    Lot #: [id number]    Mfr: Francisca December    Comments: Next Depo Due: February 28 - March 14    Patient tolerated injection without complications    Given by: Garen Grams LPN (December 31, 2009 1:34 PM)  Orders Added: 1)  Depo-Provera 150mg  [J1055]

## 2010-02-20 NOTE — Progress Notes (Signed)
Summary: refill  Phone Note Refill Request Call back at [phone] Message from:  Patient  Refills Requested: Medication #1:  LISINOPRIL-HYDROCHLOROTHIAZIDE 20-25 MG TABS Take 1 pill by mouth daily pharm states they have faxed something in  Initial call taken by: De Nurse,  February 26, 2009 11:46 AM Caller: Patient  Follow-up for Phone Call       Follow-up by: Golden Circle RN,  February 26, 2009 12:12 PM    Prescriptions: LISINOPRIL-HYDROCHLOROTHIAZIDE 20-25 MG TABS (LISINOPRIL-HYDROCHLOROTHIAZIDE) Take 1 pill by mouth daily  #30 x 2   Entered by:   Golden Circle RN   Authorized by:   Antoine Primas DO   Signed by:   Golden Circle RN on 02/26/2009   Method used:   Electronically to        [facility] [phone]* (retail)       [address]       [place], [place]  [address]       Ph: [phone]       Fax: [phone]   RxID:   [id number]

## 2010-02-20 NOTE — Miscellaneous (Signed)
Summary: phenergan request  Clinical Lists Changes rec'd fax from [facility] asking for phenergan refill. plz advise & reply to triage or fill it & send.Golden Circle RN  February 27, 2009 11:03 AM   I have never filled phenergen for her.  Where did she get it ?  it is on her [facility] med list. another md here had ordered it.Golden Circle RN  March 06, 2009 2:11 PM  Appended Document: phenergan request lm asking her to call me. will need to discuss & probably make appt before md will rx  Appended Document: phenergan request states she had nausea last week & asked pharmacy if she had refills. she did not since it has been about a year since it was rx'd. she does not need any now. told her to ask md about getting another rx to have on hand if she experiences nausea again. states she will

## 2010-02-20 NOTE — Assessment & Plan Note (Signed)
Summary: depo & f/u dm,df   Vital Signs:  Patient profile:   45 year old female Height:      60 inches Weight:      261 pounds BMI:     51.16 BSA:     2.09 Temp:     98.6 degrees F Pulse rate:   100 / minute BP sitting:   112 / 70  Vitals Entered By: Jone Baseman CMA (October 07, 2009 4:21 PM) CC: Depo and F/U DM Is Patient Diabetic? Yes Did you bring your meter with you today? No Pain Assessment Patient in pain? no        Primary Care Keili Hasten:  Antoine Primas DO  CC:  Depo and F/U DM.  History of Present Illness: Pt is here for follow up on diabetes.    Pt brought her blood sugar monitor and the vast majority 120's to 150's.  Pt states she is doing well no polyuria or dipsia to report.  No real side effects to medicne as long as she eats something.   Pt BP is well controlled and she denies any headaches, abdominal pain visual changes   Pt weight was addressed that she has lost 3 #.  Still not walking consistentyl.  Planning on starting water aerobics. pt would like to lose weight mostly to get off the medications more than anything.   Pt is also here for her Depo shot.  Pt states that she likes her depo and does not want to switch to another contraception at this moment. Pt told of other alternatives that would be better for her weight and bone health again, still wants shot.   Genella Rife-  COntrolled only some nausea from time to time no vomiting, no hematemsis regual bowel movements   fatigue-  A little more fatigue then usual once she makes it to the middle of the day has a hard time getting the energy to make it during the day and may even doze off.  Pt states this is new for her.  Getting decent sleep no change in amount of sleep.    Habits & Providers  Alcohol-Tobacco-Diet     Tobacco Status: never  Current Medications (verified): 1)  Calcium 600+d 600-400 Mg-Unit Tabs (Calcium Carbonate-Vitamin D) .Marland Kitchen.. 1 Tab By Mouth Two Times A Day 2)   Lisinopril-Hydrochlorothiazide 20-25 Mg Tabs (Lisinopril-Hydrochlorothiazide) .... Take 1 Pill By Mouth Daily 3)  Lancets  Misc (Lancets) .... Check Blood Sugar Two Times A Day 4)  Onetouch Basic System W/device Kit (Blood Glucose Monitoring Suppl) .... Check Blood Sugar Two Times A Day 5)  Omeprazole 20 Mg Cpdr (Omeprazole) .... Take 1 Tab By Mouth Once Daily 6)  Fexofenadine Hcl 60 Mg Tabs (Fexofenadine Hcl) .... Take 1 Tab By Mouth Daily  Allergies (verified): No Known Drug Allergies  Review of Systems       see hpi  Physical Exam  General:  Well-developed,well-nourished,in no acute distress; alert,appropriate and cooperative throughout examination Eyes:  No corneal or conjunctival inflammation noted. EOMI. Perrla.  Mouth:  MMM Lungs:  Normal respiratory effort, chest expands symmetrically. Lungs are clear to auscultation, no crackles or wheezes. Heart:  Normal rate and regular rhythm. S1 and S2 normal without gallop, murmur, click, rub or other extra sounds. Abdomen:  Bowel sounds positive,abdomen soft and non-tender without masses, organomegaly or hernias noted. Pulses:  2+ Extremities:  no edema, NVI right leg has discoloration due to some type of bite most of anterior shin.  Neurologic:  NVI  in all extremities   Impression & Recommendations:  Problem # 1:  DIAB W/UNSPEC COMP TYPE II/UNSPEC TYPE UNCNTRL (ICD-250.92) A1c stable at goal, could improve a little so increased metformin to 100mg  by mouth two times a day and will monitor.  will change to glipzide 5mg  daily xr Her updated medication list for this problem includes:    Lisinopril-hydrochlorothiazide 20-25 Mg Tabs (Lisinopril-hydrochlorothiazide) .Marland Kitchen... Take 1 pill by mouth daily    Metformin Hcl 1000 Mg Tabs (Metformin hcl) .Marland Kitchen... Take 1 tab by mouth two times a day    Glipizide 5 Mg Xr24h-tab (Glipizide) .Marland Kitchen... Take 1 tab daily  Orders: A1C-[facility] (82956) UA Microalbumin-[facility] (21308) [facility]- Est  Level 4 (65784)  Labs  Reviewed: Creat: 0.80 (01/25/2009)   Microalbumin: 10 (10/07/2009)  Last Eye Exam: normal (06/19/2009) Reviewed HgBA1c results: 6.8 (10/07/2009)  6.7 (06/27/2009)  Problem # 2:  OBESITY, NOS (ICD-278.00) pt will see Dr. Gerilyn Pilgrim for mutrition counseling. Will also make goal to be at 250# in 3 months time. Pt is to start water aerobics.  Orders: [facility]- Est  Level 4 (69629)  Problem # 3:  HYPERTENSION, BENIGN SYSTEMIC (ICD-401.1) at goal no side effects will continue medications, likely will need to decrease when pt starts to lose weight.  will need BMET at next visit.  Her updated medication list for this problem includes:    Lisinopril-hydrochlorothiazide 20-25 Mg Tabs (Lisinopril-hydrochlorothiazide) .Marland Kitchen... Take 1 pill by mouth daily  Orders: [facility]- Est  Level 4 (99214)  Problem # 4:  ENCOUNTER FOR LONG-TERM USE OF OTHER MEDICATIONS (ICD-V58.69) due to pt fatugue and chronic other medications will get B12 level.  Orders: B12-[facility] (52841-32440)  Problem # 5:  GASTROESOPHAGEAL REFLUX, NO ESOPHAGITIS (ICD-530.81) oing well will also give phenergen for pt with mild nausea from time to time.  Her updated medication list for this problem includes:    Omeprazole 20 Mg Cpdr (Omeprazole) .Marland Kitchen... Take 1 tab by mouth once daily  Problem # 6:  CONTRACEPTIVE MANAGEMENT (ICD-V25.09) Pt to recieve depo again today will consider switching to OCP or another form at three months.  Pt warned of the side effects of long term depo but still feels this is necssary. Pt does not want to have any more periods.   Problem # 7:  shin discoloration told pt to try glycolic acid to apply to leg daily, would liekly help blend the skin color some.   Complete Medication List: 1)  Calcium 600+d 600-400 Mg-unit Tabs (Calcium carbonate-vitamin d) .Marland Kitchen.. 1 tab by mouth two times a day 2)  Lisinopril-hydrochlorothiazide 20-25 Mg Tabs (Lisinopril-hydrochlorothiazide) .... Take 1 pill by mouth daily 3)  Lancets Misc (Lancets)  .... Check blood sugar two times a day 4)  Onetouch Basic System W/device Kit (Blood glucose monitoring suppl) .... Check blood sugar two times a day 5)  Omeprazole 20 Mg Cpdr (Omeprazole) .... Take 1 tab by mouth once daily 6)  Fexofenadine Hcl 60 Mg Tabs (Fexofenadine hcl) .... Take 1 tab by mouth daily 7)  Metformin Hcl 1000 Mg Tabs (Metformin hcl) .... Take 1 tab by mouth two times a day 8)  Glipizide 5 Mg Xr24h-tab (Glipizide) .... Take 1 tab daily 9)  Promethazine Hcl 25 Mg Tabs (Promethazine hcl) .... Take 1 tab by mouth q 8 hrs as needed for nausea  Other Orders: TSH-[facility] ([id number])  Patient Instructions: 1)  nice to see you. 2)  you are doing great.   3)  I am going to increase your metformin to 100mg   two times a day.  It will be a different medicine next time you refill it.  4)  We will check your thyroid and B12 today 5)  We will change your glyburide to a longer acting medication you will need to take 1 time a day 6)  i need to see you again in 3 months, at that time lets pick a new form of nirth control.  Prescriptions: PROMETHAZINE HCL 25 MG TABS (PROMETHAZINE HCL) take 1 tab by mouth Q 8 hrs as needed for nausea  #30 x 1   Entered and Authorized by:   Antoine Primas DO   Signed by:   Antoine Primas DO on 10/07/2009   Method used:   Electronically to        [facility] [phone]* (retail)       [address]       [place], [place]  [address]       Ph: [phone]       Fax: [phone]   RxID:   [id number] GLIPIZIDE 5 MG XR24H-TAB (GLIPIZIDE) take 1 tab daily  #93 x 1   Entered and Authorized by:   Antoine Primas DO   Signed by:   Antoine Primas DO on 10/07/2009   Method used:   Electronically to        [facility] [phone]* (retail)       [address]       [place], [place]  [address]       Ph: [phone]       Fax: [phone]   RxID:   [id number] METFORMIN HCL 1000 MG TABS (METFORMIN HCL) take 1 tab by mouth two times a day  #180 x 1    Entered and Authorized by:   Antoine Primas DO   Signed by:   Antoine Primas DO on 10/07/2009   Method used:   Electronically to        [facility] [phone]* (retail)       [address]       [place], [place]  [address]       Ph: [phone]       Fax: [phone]   RxID:   [id number]   Prevention & Chronic Care Immunizations   Influenza vaccine: Not documented   Influenza vaccine due: 09/19/2009    Tetanus booster: 07/24/2004: Done.   Tetanus booster due: 07/25/2014    Pneumococcal vaccine: Not documented  Other Screening   Pap smear: normal  (02/06/2008)   Pap smear due: 02/05/2010    Mammogram: Not documented   Smoking status: never  (10/07/2009)  Diabetes Mellitus   HgbA1C: 6.8  (10/07/2009)   Hemoglobin A1C due: 05/06/2008    Eye exam: normal  (06/19/2009)   Eye exam due: 06/2010    Foot exam: yes  (06/27/2009)   High risk foot: Not documented   Foot care education: Not documented    Urine microalbumin/creatinine ratio: Not documented   Urine microalbumin action/deferral: Ordered    Diabetes flowsheet reviewed?: Yes   Progress toward A1C goal: At goal    Stage of readiness to change (diabetes management): Maintenance  Lipids   Total Cholesterol: 146  (02/06/2008)   LDL: 81  (02/06/2008)   LDL Direct: 84  (01/25/2009)   HDL: 42  (02/06/2008)   Triglycerides: 114  (02/06/2008)   Lipid panel due: 12/27/2009  Hypertension   Last Blood Pressure: 112 / 70  (10/07/2009)   Serum creatinine: 0.80  (01/25/2009)  Serum potassium 4.0  (01/25/2009)   Basic metabolic panel due: 01/25/2010    Hypertension flowsheet reviewed?: Yes   Progress toward BP goal: At goal    Stage of readiness to change (hypertension management): Maintenance  Self-Management Support :   Personal Goals (by the next clinic visit) :     Personal A1C goal: 7  (10/07/2009)     Personal blood pressure goal: 130/80  (10/07/2009)     Personal LDL goal: 100  (10/07/2009)     Diabetes self-management support: Copy of home glucose meter record, Pre-printed educational material, Referred for self-management class  (06/27/2009)    Hypertension self-management support: BP self-monitoring log, Written self-care plan, Pre-printed educational material  (06/27/2009)  Laboratory Results   Urine Tests  Date/Time Received: October 07, 2009 5:00 PM  Date/Time Reported: October 07, 2009 5:16 PM   Microalbumin (urine): 10 mg/L Creatinine: 100mg /dL  A:C Ratio <16 Normal Comments: ...............test performed by......Marland KitchenBonnie A. [place], MLS (ASCP)cm   Blood Tests   Date/Time Received: October 07, 2009 4:16 PM  Date/Time Reported: October 07, 2009 4:27 PM   HGBA1C: 6.8%   (Normal Range: Non-Diabetic - 3-6%   Control Diabetic - 6-8%)  Comments: ...............test performed by......Marland KitchenBonnie A. [place], MLS (ASCP)cm       Appended Document: depo & f/u dm,df   Medication Administration  Injection # 1:    Medication: Depo-Provera 150mg     Diagnosis: CONTRACEPTIVE MANAGEMENT (ICD-V25.09)    Route: IM    Site: R deltoid    Exp Date: 03/19/2012    Lot #: [id number]    Mfr: Francisca December    Comments: Next Depo Due: December 5 - December 19    Patient tolerated injection without complications    Given by: Garen Grams LPN (October 08, 2009 8:57 AM)  Orders Added: 1)  Depo-Provera 150mg  [J1055]

## 2010-02-20 NOTE — Progress Notes (Signed)
Summary: Rx Req  Phone Note Refill Request Call back at [phone] Message from:  Patient  Refills Requested: Medication #1:  LISINOPRIL-HYDROCHLOROTHIAZIDE 20-25 MG TABS Take 1 pill by mouth daily [facility] [address]  Initial call taken by: Clydell Hakim,  June 20, 2009 3:59 PM  Follow-up for Phone Call        unable to reach her by phone. filled 14 days of meds. must be seen due to HTN & diabtes Follow-up by: Golden Circle RN,  June 20, 2009 4:00 PM    Prescriptions: LISINOPRIL-HYDROCHLOROTHIAZIDE 20-25 MG TABS (LISINOPRIL-HYDROCHLOROTHIAZIDE) Take 1 pill by mouth daily  #14 x 0   Entered by:   Golden Circle RN   Authorized by:   Antoine Primas DO   Signed by:   Golden Circle RN on 06/20/2009   Method used:   Electronically to        [facility] [phone]* (retail)       [address]       [place], [place]  [address]       Ph: [phone]       Fax: [phone]   RxID:   [id number]

## 2010-02-20 NOTE — Assessment & Plan Note (Signed)
Summary: still not better/eo   Vital Signs:  Patient profile:   45 year old female Height:      60 inches Weight:      255 pounds BMI:     49.98 BSA:     2.07 Temp:     98.7 degrees F Pulse rate:   100 / minute BP sitting:   118 / 74  Vitals Entered By: Delora Fuel (February 12, 2010 2:39 PM) CC: still no better Is Patient Diabetic? No Pain Assessment Patient in pain? yes     Location: head Intensity: 6   Primary Care Provider:  Antoine Primas DO  CC:  still no better.  History of Present Illness: 45 yo female here for f.u for sore throat.  Pt was given an NSAID for her sore throat and encouraged supportive therapy being that likely viral in nature.   Pt returns today no better and maybe even a little worse.  Pt has started to have a cough as well now and feels much more fatigued.  Pt denies fever, chills, vomiting, diarrhea or constipation.  Pt does states she has been nauseated as well and is using the phenergen which is helping. Pt states starting today may be a little more short of breath as well. Cough is somewhat productive with white to yellow phlegm.  Habits & Providers  Alcohol-Tobacco-Diet     Tobacco Status: never  Current Medications (verified): 1)  Calcium 600+d 600-400 Mg-Unit Tabs (Calcium Carbonate-Vitamin D) .Marland Kitchen.. 1 Tab By Mouth Two Times A Day 2)  Lisinopril-Hydrochlorothiazide 20-25 Mg Tabs (Lisinopril-Hydrochlorothiazide) .... Take 1 Pill By Mouth Daily 3)  Lancets  Misc (Lancets) .... Check Blood Sugar Two Times A Day 4)  Onetouch Basic System W/device Kit (Blood Glucose Monitoring Suppl) .... Check Blood Sugar Two Times A Day 5)  Omeprazole 20 Mg Cpdr (Omeprazole) .... Take 1 Tab By Mouth Once Daily 6)  Fexofenadine Hcl 60 Mg Tabs (Fexofenadine Hcl) .... Take 1 Tab By Mouth Daily 7)  Metformin Hcl 1000 Mg Tabs (Metformin Hcl) .... Take 1 Tab By Mouth Two Times A Day 8)  Glipizide 5 Mg Xr24h-Tab (Glipizide) .... Take 1 Tab Daily 9)  Promethazine  Hcl 25 Mg Tabs (Promethazine Hcl) .... Take 1 Tab By Mouth Q 8 Hrs As Needed For Nausea 10)  Azithromycin 500 Mg Tabs (Azithromycin) .Marland Kitchen.. 1 Tab Daily For 3 Days 11)  Fluticasone Propionate 50 Mcg/act Susp (Fluticasone Propionate) .Marland Kitchen.. 1 Spray Each Nostril Daily 12)  Tussionex Pennkinetic Er 10-8 Mg/59ml Lqcr (Hydrocod Polst-Chlorphen Polst) .Marland Kitchen.. 1-2 Tsp Two Times A Day As Needed For Sore Throat and Cough  Allergies (verified): No Known Drug Allergies  Past History:  Past medical, surgical, family and social histories (including risk factors) reviewed, and no changes noted (except as noted below).  Past Medical History: Reviewed history from 03/18/2006 and no changes required. Hyperglycemia 3/06 [facility] inpatient, MRSA  Past Surgical History: Reviewed history from 03/18/2006 and no changes required. BTL -2002 - 08/04/2004, Cholecystectomy - 01/20/2004, ERCP- CBD stone, S/P Balloon sweeps with N cholangiogram - 04/11/2004, Serum glucose -163,  Hb Aic 6.6 - 08/04/2004, TV US: 0.9 cm myometrial fundal fibroid - 08/04/2004  Family History: Reviewed history from 08/24/2008 and no changes required. Breast CA 1st degree, Colon Ca - paternal grandfather, HTN - parents  Social History: Reviewed history from 06/27/2009 and no changes required. Single. Lives with 42 y/o son. working. No smoking. Occ. Alcohol use. No STDs nor Iv drug use. Fast-food diet  only sporadic exercise  Review of Systems       see hpi  Physical Exam  General:  Well-developed,well-nourished,in no acute distress; alert,appropriate and cooperative throughout examination, looks run down Eyes:  No corneal or conjunctival inflammation noted. EOMI. Perrl Ears:  fluid behind both TM Nose:  blusih tint to turbinates.  Mouth:  Oropharynx markedly erythematous with swollen tonsils without exudate. Neck:  No deformities, masses, or tenderness noted.  Mild anterior LAD Lungs:  Normal respiratory effort, chest expands symmetrically.  Lungs are clear to auscultation, no crackles or wheezes. Heart:  Normal rate and regular rhythm. S1 and S2 normal without gallop, murmur, click, rub or other extra sounds. Abdomen:  Bowel sounds positive,abdomen soft and non-tender without masses, organomegaly or hernias noted. Pulses:  2+ Extremities:  no edema, NVI    Impression & Recommendations:  Problem # 1:  ACUTE BRONCHITIS (ICD-466.0) pt has not had any lung trouble but does appear to start having laryngitis to bronchitis.  Will treat azithromycin.  for 3 days and given flonase due to likely having allergic rhinitis component as well. Pt will have codiene cough for 5 days as well.  Given red flags and when to return .  Her updated medication list for this problem includes:    Azithromycin 500 Mg Tabs (Azithromycin) .Marland Kitchen... 1 tab daily for 3 days    Tussionex Pennkinetic Er 10-8 Mg/32ml Lqcr (Hydrocod polst-chlorphen polst) .Marland Kitchen... 1-2 tsp two times a day as needed for sore throat and cough  Orders: [facility]- Est Level  3 ([address]109)  Complete Medication List: 1)  Calcium 600+d 600-400 Mg-unit Tabs (Calcium carbonate-vitamin d) .Marland Kitchen.. 1 tab by mouth two times a day 2)  Lisinopril-hydrochlorothiazide 20-25 Mg Tabs (Lisinopril-hydrochlorothiazide) .... Take 1 pill by mouth daily 3)  Lancets Misc (Lancets) .... Check blood sugar two times a day 4)  Onetouch Basic System W/device Kit (Blood glucose monitoring suppl) .... Check blood sugar two times a day 5)  Omeprazole 20 Mg Cpdr (Omeprazole) .... Take 1 tab by mouth once daily 6)  Fexofenadine Hcl 60 Mg Tabs (Fexofenadine hcl) .... Take 1 tab by mouth daily 7)  Metformin Hcl 1000 Mg Tabs (Metformin hcl) .... Take 1 tab by mouth two times a day 8)  Glipizide 5 Mg Xr24h-tab (Glipizide) .... Take 1 tab daily 9)  Promethazine Hcl 25 Mg Tabs (Promethazine hcl) .... Take 1 tab by mouth q 8 hrs as needed for nausea 10)  Azithromycin 500 Mg Tabs (Azithromycin) .Marland Kitchen.. 1 tab daily for 3 days 11)  Fluticasone  Propionate 50 Mcg/act Susp (Fluticasone propionate) .Marland Kitchen.. 1 spray each nostril daily 12)  Tussionex Pennkinetic Er 10-8 Mg/40ml Lqcr (Hydrocod polst-chlorphen polst) .Marland Kitchen.. 1-2 tsp two times a day as needed for sore throat and cough  Patient Instructions: 1)  Good to see you 2)  We will do 3 days of azithromycin 3)  I want you to start flonase for your nose as well.  Do one spray each nostril daily 4)  I am givin you a cough syrup as well to help for the next 5 days but will make you sleepy 5)  Come back in 1 week if not better Prescriptions: TUSSIONEX PENNKINETIC ER 10-8 MG/5ML LQCR (HYDROCOD POLST-CHLORPHEN POLST) 1-2 tsp two times a day as needed for sore throat and cough  #133ml x 0   Entered and Authorized by:   Antoine Primas DO   Signed by:   Antoine Primas DO on 02/12/2010   Method used:   Print  then Give to Patient   RxID:   [id number] FLUTICASONE PROPIONATE 50 MCG/ACT SUSP (FLUTICASONE PROPIONATE) 1 spray each nostril daily  #1 x 6   Entered and Authorized by:   Antoine Primas DO   Signed by:   Antoine Primas DO on 02/12/2010   Method used:   Electronically to        [facility] [phone]* (retail)       [address]       [place], [place]  [address]       Ph: [phone]       Fax: [phone]   RxID:   [id number] AZITHROMYCIN 500 MG TABS (AZITHROMYCIN) 1 tab daily for 3 days  #3 x 0   Entered and Authorized by:   Antoine Primas DO   Signed by:   Antoine Primas DO on 02/12/2010   Method used:   Electronically to        [facility] [phone]* (retail)       [address]       [place], [place]  [address]       Ph: [phone]       Fax: [phone]   RxID:   [id number]    Orders Added: 1)  [facility]- Est Level  3 [20254]

## 2010-02-26 ENCOUNTER — Telehealth: Payer: Self-pay | Admitting: Family Medicine

## 2010-02-26 NOTE — Telephone Encounter (Signed)
Pt would need to be see again if not ding better, sorry but this has been quite a long time.

## 2010-02-26 NOTE — Telephone Encounter (Signed)
Will forward to MD.  

## 2010-02-26 NOTE — Telephone Encounter (Signed)
Left message on voicemail, instructing patient to call back and schedule an appointment.

## 2010-03-07 ENCOUNTER — Ambulatory Visit: Payer: Self-pay | Admitting: Family Medicine

## 2010-03-24 ENCOUNTER — Ambulatory Visit (INDEPENDENT_AMBULATORY_CARE_PROVIDER_SITE_OTHER): Payer: No Typology Code available for payment source | Admitting: *Deleted

## 2010-03-24 DIAGNOSIS — Z309 Encounter for contraceptive management, unspecified: Secondary | ICD-10-CM

## 2010-03-24 MED ORDER — MEDROXYPROGESTERONE ACETATE 150 MG/ML IM SUSP
150.0000 mg | Freq: Once | INTRAMUSCULAR | Status: AC
  Administered 2010-03-24: 150 mg via INTRAMUSCULAR

## 2010-06-06 NOTE — H&P (Signed)
   NAME:  [name], [name]                     ACCOUNT NO.:  [id number]   MEDICAL RECORD NO.:  [id number]                   PATIENT TYPE:  AMB   LOCATION:  [facility]                                  FACILITY:  [facility]   PHYSICIAN:  Tracie Harrier, M.D.              DATE OF BIRTH:  22-Nov-1965   DATE OF ADMISSION:  10/26/2001  DATE OF DISCHARGE:  10/26/2001                                HISTORY & PHYSICAL   HISTORY OF PRESENT ILLNESS:  The patient is a 45 year old female, gravida 1,  para 1 admitted for permanent tubal sterilization.  The patient was  thoroughly counseled regarding the risks and benefits of tubal ligation.  She strongly wishes to proceed with voluntary sterilization.  Also, the  patient has had abnormal uterine bleeding over the past and does not want to  take hormone therapy for this.  We discussed different treatment options of  abnormal uterine bleeding and she had decided to proceed with dilatation and  curettage, hysteroscopy for possible endometrial ablation.   MEDICAL HISTORY:  1. History of chronic hypertension.  2. History of obesity.   OB HISTORY:  Normal spontaneous vaginal delivery times one at term.   SURGICAL HISTORY:  None.   CURRENT MEDICATIONS:  1. Accupril.  2. Lasix.   ALLERGIES:  None known.   PHYSICAL EXAMINATION:  VITAL SIGNS:  Stable.  Temperature 98.0.  Blood  pressure 124/82.  Weight 274 pounds.  GENERAL:  She is a well-developed, well-nourished but overweight female in  no acute distress.  HEENT:  Within normal limits  NECK:  Supple without adenopathy or thyromegaly.  HEART AND LUNGS:  Clear.  BREAST EXAM:  Breast exam done recently in the office was normal but is  deferred upon admission.  ABDOMEN:  Overweight without masses, tenderness, hernia or organomegaly.  EXTREMITIES:  Neurologically grossly normal.  PELVIC EXAM:  Normal external female genitalia.  Vagina and cervix clear.  The uterus is small and mobile.  The adnexa are  clear.   ADMISSION DIAGNOSES:  1. Request permanent, voluntary sterilization.  2. Abnormal uterine bleeding.  3. Chronic hypertension.  4. Obesity.   PLAN:  1. Laparoscopy bilateral tubal ligation by open technique.  2. Endometrial ablation.   DISCUSSION:  The risks and benefits of this procedure were discussed with  the patient.  The risk of bleeding, infection, risk of injury to surrounding  organs was reviewed.  Also, the goals of therapy were reviewed.  Failure  rate of tubal ligation was discussed with her.  Questions were answered.                                               Tracie Harrier, M.D.    REG/MEDQ  D:  11/02/2001  T:  11/02/2001  Job:  [id number]

## 2010-06-06 NOTE — Op Note (Signed)
[name], [name]           ACCOUNT NO.:  [id number]   MEDICAL RECORD NO.:  [id number]          PATIENT TYPE:  AMB   LOCATION:  DAY                          FACILITY:  [facility]   PHYSICIAN:  Anselm Pancoast. Weatherly, M.D.DATE OF BIRTH:  Jul 20, 1965   DATE OF PROCEDURE:  02/06/2004  DATE OF DISCHARGE:                                 OPERATIVE REPORT   PREOPERATIVE DIAGNOSES:  1.  Chronic cholecystitis.  2.  Exogenous obesity.   POSTOPERATIVE DIAGNOSES:  1.  Chronic cholecystitis.  2.  Exogenous obesity.  3.  Common duct stone.   OPERATION:  Laparoscopic cholecystectomy with cholangiogram.   SURGEON:  Dr. Consuello Bossier   ASSISTANT:  Dr. Chevis Pretty   ANESTHESIA:  General.   HISTORY:  [name] is a 45 year old female, who was referred to [place]  by Dr. Russella Dar after she had an upper endoscopy that did not show any evidence  of obvious peptic ulcer disease and gives a history of recurring epigastric  pains, radiating to the upper abdomen.  An ultrasound showed numerous  gallstones but noted a contracted gallbladder, and Dr. Russella Dar referred her to  [place].  The patient is 5 feet tall 263 pounds and on exam in the office, was  not acutely tender with deep pressure in the right upper quadrant,  plus/minus tenderness, but not that of an acute gallbladder when I saw her  last week.  I was able to get her on the OR schedule today and,  preoperatively, her CMET showed an alkaline phosphatase only about 1 point  above normal of 118.  Her potassium was a little low, but she had been on  hydrochlorothiazide for blood pressure problems.   The patient was taken to the operative suite, given 3 g of Unasyn, PAS  stockings, and positioned on the OR table.  Induction of general anesthesia,  endotracheal tube, oral tube into the stomach, and then the abdomen was  prepped with Betadine surgical scrub and solution and draped in a sterile  manner.  A small incision was made below the umbilicus  down about 4 inches.  The fascia was identified.  This was grasped with two Kochers and a small  opening carefully made into the peritoneal cavity.  A pursestring suture of  0 Vicryl was placed and the Hasson cannula introduced.  The upper 10 mm  trocar was placed after anesthetizing the fascia with Marcaine at the  subxiphoid, and Dr. Carolynne Edouard placed the two lateral 5 mm trocars at the  appropriate position.  She was very heavy, difficult to expose because of  her size, and we very quickly switched to the 30-degree scope for better  visualization.  The gallbladder had a big stone within it that was hard to  grasp, but we could grasp it a little bit proximal, and then the gallbladder  was just encased with fatty tissue, so you could not really see where the  proximal portion of the gallbladder was.  I dissected out down into the area  I thought was proximal gallbladder and then did identify the junction of the  gallbladder and what appeared to  be an enlarged cystic duct.  We were able  to get a clip along this and then made a little cut just proximal and put in  a Reddick catheter.  There was a lot of sludge and other stuff coming back,  and it appears to be increased pressure of the common bile duct.  The  cholangiogram showed it appears to be three moderate-sized stones within the  common bile duct, and there flow into the duodenum.  I could see the  intrahepatic radicles.  The cystic duct appears to kind of go anterior into  the cystic duct but because of her size and the difficult exposure, I did  not dissect it out more proximally because you really could not get the  purchase to actually dissect out there more proximally.  I did place two  Endoloops of Prolene PDS on the cystic duct as it was too large to actually  be clipped completely across, and then we identified the cystic artery that  was doubly clipped proximally and then divided and then kind of cored out  the gallbladder from its  intrahepatic bed area with the first hook and then  switched to the spatula with electrocautery.  There was a little bit of  bleeding up in the most distal portion that required cauterization, good  hemostasis obtained.  Placed the gallbladder in the EndoCatch bag and then  switched the camera to the upper 10 mm port and withdrew the stones within  the gallbladder and bag at the umbilicus.  A #5 needle was then used to  place the second figure-of-eight of 0 Vicryl within the fascia at the  umbilicus, and then we anesthetized the fascia here with Marcaine with  adrenalin.  The radiologist agreed there were stones within the common bile  duct, and we will have Dr. Russella Dar or associate see her for an ERCP tomorrow.  The patient's subcutaneous wounds were closed with 4-0 Vicryl.  We removed  all the irrigating fluid, reinspected for bleeding and saw none at the  gallbladder fossa bed and did not place any drains.  The carbon dioxide was  release, and then the subcutaneous wounds were closed with 4-0 Vicryl.  Benzoin and Steri-Strips on the skin.  The patient tolerated the procedure  nicely and was sent to recovery room in stable postop condition.      WJW/MEDQ  D:  02/06/2004  T:  02/06/2004  Job:  [id number]

## 2010-06-06 NOTE — Discharge Summary (Signed)
[name], [name]           ACCOUNT NO.:  [id number]   MEDICAL RECORD NO.:  [id number]          PATIENT TYPE:  INP   LOCATION:  5735                         FACILITY:  [facility]   PHYSICIAN:  Wilhemina Bonito. Marina Goodell, M.D. [facility] OF BIRTH:  04/29/1965   DATE OF ADMISSION:  04/06/2004  DATE OF DISCHARGE:  04/07/2004                                 DISCHARGE SUMMARY   ADMITTING PHYSICIAN:  Barbette Hair. Arlyce Dice, M.D.   PRIMARY CARE PHYSICIAN:  Lawerance Sabal, M.D.   ADMISSION DIAGNOSES:  1.  Epigastric pain associated with elevated liver function tests. Rule out  recurrent common bile duct stone.  1.  Status post laparoscopic cholecystectomy by Dr. Zachery Dakins on February 06, 2004.  2.  Status post endoscopic retrograde cholangiopancreatography with removal      of multiple common bile duct and sphincterotomy on February 07, 2004 by      Lina Sar, M.D.  3.  Morbid obesity.  4.  Primary hypertension, medically managed.  5.  Gravida 1, para 1, status post vaginal delivery.  6.  Status post laparoscopic tubal ligation with endometrial ablation      October 2003.  7.  Menorrhagia controlled with Depo-Provera shots.  8.  Hyperglycemia. No prior history of glucose intolerance or diabetes      mellitus.   DISCHARGE DIAGNOSES:  1.  Epigastric pain with elevated liver function tests.  Felt secondary to      past common bile duct stone.  Status post endoscopic retrograde      cholangiopancreatography revealing 10 mm filling defect in the common      bile duct. Status post multiple balloon sweeps with no stones delivered      but sweeping cholangiogram revealed no retained stones or filling      defects. Sphincterotomy was widely patent and not enlarged.  2.  Hyperglycemia with admission glucose level of 183. This will require      further followup by her primary care physician.  3.  Trichomonas seen on microscopic urine. Will plan to treat despite lack      of urinary tract infection  symptoms.   BRIEF HISTORY:  Ms. Jaquez is a 45 year old obese African-American female.  In January of 2006, she had had a laparoscopic cholecystectomy and the  following day had ERCP with sphincterotomy and removal of common bile duct  stones by Dr. Lina Sar. She presented to [facility] Emergency Room with three  day history of severe epigastric pain that radiated into her back. Labs  revealed an elevation of all of her LFT's.  Dr. Arlyce Dice assessed her and felt  that the picture was consistent with a retained common bile duct stone. He  took her to ERCP where there was a questionable filling defect in the common  bile duct possibly an air bubble or a stone. He swept the duct multiple  times after which the cholangiogram was clear. He was concerned that because  of the multiple balloon sweeps that she may end up with post ERCP  pancreatitis and elected to admit her overnight for observation.   CONSULTATIONS:  None.  PROCEDURE:  ERCP as described above.   LABORATORY DATA:  Hemoglobin went from 16 to 12.6 following hydration.  Hematocrit went from 47 to 36.6. White blood cell count 9, platelets of  227,000. Sodium 136, potassium 3.0.  Chloride 99. Glucose 183.  BUN of 6,  creatinine of 1.3.  Total bilirubin went from 2.4 to 1.6, direct fraction  went from 1.3 to 0.7.  Alkaline phosphatase went from 235 to 234. AST went  from 135 to 81. ALT 251 to 214. Albumin 2.8. Amylase 76, lipase 35.  Urine  pregnancy test negative. Urinalysis showed 3-6 white blood cell count per  high powered field, 0-2 red blood cells per high powered field. No nitrites  and a small amount of leukocyte esterase was present. There was a small  amount of bilirubin as well. On microscopic exam, Trichomonas was seen.   HOSPITAL COURSE:  #1.  POST ERCP COURSE.  The patient did well following the  procedure. She was started on full liquids which she was tolerating. Pain  decreased though it was not completely resolved by  the following morning.  She did have some limited nausea but no vomiting within several hours of the  ERCP. This did not recur the following morning. The plan was to have the  patient eat some solid food, and if tolerated that she would go home early  in the afternoon of the 20th of March. Abdominal exam had greatly improved  and there was no tenderness.  #2.  HYPERGLYCEMIA.  The patient's blood glucose was in the 180's. The  patient says she has not had doctors mention to her any history of elevated  blood sugars. However, she certainly fits the type, she is well overweight,  has hypertension, her lipid profile is not known. In any event, she was  advised to contact Dr. [facility] and to followup with them for  further testing. In the meantime, she should watch her diet and avoid  concentrated sweets and fatty foods.  #3.  TRICHOMONAS SEEN ON URINE MICROSCOPIC.  A call was made to the family  practice resident covering call, Dr. Penni Bombard.  Their policy at this point is  to treat Trichomonas when seen even if the patient is asymptomatic.  Therefore she will be given a seven day course of Flagyl as per his  recommendation.   CONDITION AT TIME OF DICTATION:  Much improved and appears ready for  discharge; however, she is a bit anxious having not had any solid food and  would like to try some before going home.   FOLLOW UP:  Will plan to make an appointment for her to see Dr. Juanda Chance  within the next 2-3 weeks. If she is doing well in the interim and does not  feel it necessary to see Dr. Juanda Chance she was advised to please call her  office and cancel the appointment. She is to make an appointment with Dr.  [facility] at her convenience, but she should be seen there within the  next 2-4 weeks for followup of the elevated blood sugar.   DISCHARGE MEDICATIONS:  1.  Hydrochlorothiazide 25 mg once daily.  2.  Nexium 40 mg once daily. 3.  Depo-Provera shots every three months.  4.   Ibuprofen 200 mg p.r.n.  She is not to restart this medication until      March 30.  5.  Tylenol/acetaminophen for p.r.n. use.   PAIN MANAGEMENT:  Tylenol as needed.   ACTIVITY:  She can  return to work on Wednesday, March 22.   DIET:  Low fat and avoid concentrated sweets and excessive carbohydrates.      SG/MEDQ  D:  04/07/2004  T:  04/07/2004  Job:  [id number]   cc:   Lawerance Sabal, MD  Fax: [phone]

## 2010-06-06 NOTE — Op Note (Signed)
NAME:  [name], [name]                     ACCOUNT NO.:  [id number]   MEDICAL RECORD NO.:  [id number]                   PATIENT TYPE:  AMB   LOCATION:  SDC                                  FACILITY:  [facility]   PHYSICIAN:  Tracie Harrier, M.D.              DATE OF BIRTH:  12/22/1965   DATE OF PROCEDURE:  10/26/2001  DATE OF DISCHARGE:  10/26/2001                                 OPERATIVE REPORT   PREOPERATIVE DIAGNOSES:  1. Abnormal uterine bleeding.  2. Voluntary sterilization.   POSTOPERATIVE DIAGNOSES:  1. Abnormal uterine bleeding.  2. Voluntary sterilization.   PROCEDURE:  1. Laparoscopic bilateral tubal ligation.  2. Hysteroscopy.  3. Dilatation and curettage.   SURGEON:  Tracie Harrier, M.D.   ANESTHESIA:  General.   ESTIMATED BLOOD LOSS:  50 cc.   COMPLICATIONS:  Unable to insert resectoscope into uterus.   FINDINGS:  At time of laparoscopy a normal pelvis was encountered.   At time of hysteroscopy I could not insert the hysteroscope into the uterus  because of cervical stenosis.  A cervical laceration occurred during this  but this was sutured.  There was no definitive polyp, only an abundance of  endometrial tissue noted.  A D&C was performed instead.   PROCEDURE:  The patient was taken to the operating room where a general  endotracheal anesthetic was administered.  The patient was placed on the  operating table in the dorsal lithotomy position.  The abdomen, perineum,  and vagina were prepped and draped in the usual sterile fashion with  Betadine and sterile drapes.  A red rubber catheter was used to empty the  bladder.  Next, a small vertical infraumbilical skin incision was made and  an open laparoscopic technique was undertaken.  The peritoneum was  atraumatically entered with Metzenbaum scissors.  Two anchor stitches were  placed on the muscle fascia and a Hasson cannula was inserted and the  laparoscope inserted.  Carbon dioxide gas was used to  insufflate the abdomen  and a pneumoperitoneum was created.  The pelvis was visualized and noted to  be normal.  Bilateral tubal ligation was carried out first by locating the  left fallopian tube and tracing it to its fimbriated end to insure its  positive identification.  The tube was then grasped approximately 2 cm from  the uterine fundus and cauterized thoroughly using the Kleppinger bipolar  cautery.  This was done thoroughly until no current passed between the two  electrodes of the bipolar cautery.  This incorporated the entire diameter of  the fallopian tube and incorporated some of the mesosalpinx.  Two full burns  were undertaken and an approximate 2.5 cm segment of tube was cauterized  using this technique.  The same procedure was repeated upon the right tube.  The scope was then removed and the pneumoperitoneum was removed.   Hysteroscopy was then undertaken.  The cervix was grasped anteriorly with  a  single tooth tenaculum.  The cervix was serially dilated until resistance  was undertaken when a Pratt dilator of 25 was reached.  I could not insert a  27 Pratt dilator.  Due to this the resectoscope was unable to be inserted.  In these efforts to dilate the cervical canal with a Pratt dilator, an  anterior cervical laceration occurred.  This did not really incorporate the  cervical canal, but just anteriorly where the single tooth tenaculum was.  Again, I was unable to insert a 27 Pratt dilator.  The cervical laceration  was closed with a 0 chromic suture with multiple interrupted sutures.  The  normal hysteroscope was then inserted using sorbitol as an insufflating  medium.  There was no definitive polyp obtained and therefore endometrial  ablation was unable to be carried out.  I did, however, perform a D&C  sharply using a small serrated curette and this went well.  Abundant  endometrial curettings were obtained.  All the vaginal instruments were then  removed.  Hemostasis  was noted from the cervical laceration.  The pelvis was  then once again visualized with the laparoscope to insure no injury had  occurred to the uterus.  This was normal.  All abdominal instruments were  then removed and the gas once again was allowed to escape from the abdomen.  The incision was closed with the existing 0 Vicryl ligatures on the muscle  fascia by tying these together.  The skin was reapproximated with Dermabond  glue.  The patient was awakened, extubated, and taken to the recovery room  in good condition.  There were no perioperative complications further.  Blood loss was 50 cc.                                               Tracie Harrier, M.D.    REG/MEDQ  D:  11/24/2001  T:  11/24/2001  Job:  [id number]

## 2010-06-06 NOTE — Op Note (Signed)
NAMETAHIRY, SPICER           ACCOUNT NO.:  [id number]   MEDICAL RECORD NO.:  [id number]          PATIENT TYPE:  OBV   LOCATION:  0456                         [facility]ACILITY:  [facility]   PHYSICIAN:  Lina Sar, M.D. [facility]  DATE OF BIRTH:  1965/12/21   DATE OF PROCEDURE:  DATE OF DISCHARGE:                                 OPERATIVE REPORT   PROCEDURE:  Endoscopic retrograde cholangiopancreatography with  sphincterotomy and stone extraction.   INDICATIONS:  This 45 year old African-American female has known  cholelithiasis.  She had undergone laparoscopic cholecystectomy by Dr.  Zachery Dakins with findings of multiple stones retained in the common bile duct  which showed on intraoperative cholangiogram.  She is now undergoing ERCP  with attempt to remove the stones.   ENDOSCOPIST:  Olympus single-channel side-viewing duodenoscope.   SEDATION:  Versed 11 mg IV, fentanyl 125 mcg IV, and Glucagon 0.5 mg IV.   FINDINGS:  Olympus single-channel side-viewing duodenoscope passed blindly  through the esophagus into the stomach, through the pylorus into the  duodenum.  The papilla was identified immediately.  It had normal  configuration and shape.  Initially the main pancreatic filled and showed  normal course in the head, body and tail of the pancreas.  Next the common  bile duct filled, and it showed multiple medium-sized stones measuring 8-10  mm in diameter, filling up the entire common bile duct, which was mildly  dilated to about 10 mm.  At least five or six stones were visible throughout  the common bile duct all the way up to intrahepatic duct.  A wide  sphincterotomy was carried out with a sphincterotome over a guidewire.  There was no bleeding from the sphincterotomy site.  Next a Wilson-Cook ERCP  balloon passed over the guidewire into the hepatic duct and multiple passes  were made to extract multiple stones.  At least five stones were extracted  but gradually, with at least five or six  passages.  The occlusion  cholangiogram finally showed complete clearance of the common bile duct.  The patient tolerated the procedure well.   IMPRESSION:  1.  Choledocholithiasis.  2.  Status post sphincterotomy and extraction of multiple stones.  3.  Status post laparoscopic cholecystectomy.  4.  Normal pancreatic duct.   PLAN:  The patient will be observed today with bowel rest and pain  medication.  We will repeat the labs tomorrow morning.  We will also cover  her with antibiotics today.  Hopefully, she will be able to be discharged  tomorrow.      DB/MEDQ  D:  02/07/2004  T:  02/07/2004  Job:  [id number]   cc:   Anselm Pancoast. Zachery Dakins, M.D.  [address]  [place]  [place] [address]  Fax: [phone]   Venita Lick. Russella Dar, M.D. [facility]

## 2010-06-19 ENCOUNTER — Encounter: Payer: Self-pay | Admitting: Family Medicine

## 2010-06-19 ENCOUNTER — Ambulatory Visit (INDEPENDENT_AMBULATORY_CARE_PROVIDER_SITE_OTHER): Payer: No Typology Code available for payment source | Admitting: Family Medicine

## 2010-06-19 DIAGNOSIS — E119 Type 2 diabetes mellitus without complications: Secondary | ICD-10-CM

## 2010-06-19 DIAGNOSIS — E669 Obesity, unspecified: Secondary | ICD-10-CM

## 2010-06-19 DIAGNOSIS — J309 Allergic rhinitis, unspecified: Secondary | ICD-10-CM | POA: Insufficient documentation

## 2010-06-19 DIAGNOSIS — K219 Gastro-esophageal reflux disease without esophagitis: Secondary | ICD-10-CM

## 2010-06-19 DIAGNOSIS — E1165 Type 2 diabetes mellitus with hyperglycemia: Secondary | ICD-10-CM

## 2010-06-19 DIAGNOSIS — Z309 Encounter for contraceptive management, unspecified: Secondary | ICD-10-CM

## 2010-06-19 DIAGNOSIS — I10 Essential (primary) hypertension: Secondary | ICD-10-CM

## 2010-06-19 LAB — POCT GLYCOSYLATED HEMOGLOBIN (HGB A1C): Hemoglobin A1C: 6.5

## 2010-06-19 MED ORDER — CETIRIZINE HCL 10 MG PO CAPS: 10.0000 mg | ORAL_CAPSULE | Freq: Every day | ORAL | Status: DC

## 2010-06-19 MED ORDER — MEDROXYPROGESTERONE ACETATE 150 MG/ML IM SUSP
150.0000 mg | Freq: Once | INTRAMUSCULAR | Status: AC
  Administered 2010-06-19: 150 mg via INTRAMUSCULAR

## 2010-06-19 MED ORDER — FLUTICASONE PROPIONATE 50 MCG/ACT NA SUSP: 2.0000 | Freq: Every day | NASAL | Status: DC

## 2010-06-19 NOTE — Assessment & Plan Note (Signed)
Encourage weight lose would set a goal of 225, pt though not as receptive, will lose some weight.

## 2010-06-19 NOTE — Patient Instructions (Signed)
It is great to see you Your A1c today is 6.5 which is fantastic!  Keep it up Your blood pressure is perfect I will refill all your medications I want to see you again in 3 months.

## 2010-06-19 NOTE — Assessment & Plan Note (Signed)
At goal continue current regimen due for labs in December.

## 2010-06-19 NOTE — Assessment & Plan Note (Signed)
Improvd A1c at goal, encouraged to keep it up and to lose weight then likely would get to decrease medications. Will see again in 3 months.

## 2010-06-19 NOTE — Assessment & Plan Note (Signed)
Seems control continue current regimen.

## 2010-06-19 NOTE — Assessment & Plan Note (Signed)
Pt on zyrtec will add flonase and see how pt tolerates and if improves symptoms.

## 2010-06-19 NOTE — Progress Notes (Signed)
  Subjective:    Patient ID: [name], female    DOB: 1965/05/04, 45 y.o.   MRN: [id number]  HPI 1. Hypertension Blood pressure at home:not checking Blood pressure today: 119/78 Taking Meds: yes Side effects:no ROS: Denies headache visual changes nausea, vomiting, chest pain or abdominal pain or shortness of breath.  2. Diabetes:  High at home: not checking Low at home: not checking Taking medications: yes Side effects:no ROS: denies fever, chills, dizziness, loss of conscieness, polyuria poly dipsia numbness or tingling in extremities or chest pain.  Genella Rife-  Well controlled certain foods still give me problems such as caffeine and chocolate but in moderation.    Overweight-  Been trying to lose weight by walking and watching diet A1c today 6.5 doing well, walk 3 times a week for 30-45 minutes a day. Wt today 253, goal weight of 225 in the long run.   Review of Systems Denies fever, chills, nausea vomiting abdominal pain, dysuria, chest pain, shortness of breath dyspnea on exertion or numbness in extremities     Objective:   Physical Exam General Appearance:    Alert, cooperative, no distress, appears stated age  Head:    Normocephalic, without obvious abnormality, atraumatic  Eyes:    PERRL, conjunctiva/corneas clear, EOM's intact, fundi    benign, both eyes  Ears:    Normal TM's and external ear canals, both ears mild fluid behind TM  Nose:   Nares normal, septum midline, mucosa normal, no drainage    or sinus tenderness  Throat:   Lips, mucosa, and tongue normal; teeth and gums normal  Neck:   Supple, symmetrical, trachea midline, no adenopathy;    thyroid:  no enlargement/tenderness/nodules; no carotid   bruit or JVD  Back:     Symmetric, no curvature, ROM normal, no CVA tenderness  Lungs:     Clear to auscultation bilaterally, respirations unlabored   Heart:    Regular rate and rhythm, S1 and S2 normal, no murmur, rub   or gallop  Abdomen:     Soft,  non-tender, bowel sounds active all four quadrants,    no masses, no organomegaly  Extremities:  Extremities normal, atraumatic, no cyanosis trace edema  Pulses:   2+ and symmetric all extremities  Skin:   Skin color, texture, turgor normal, no rashes or lesions feet have cracked dry skin on plantar aspect bilaterally.           Assessment & Plan:

## 2010-06-24 ENCOUNTER — Other Ambulatory Visit: Payer: Self-pay | Admitting: Family Medicine

## 2010-06-24 MED ORDER — GLIPIZIDE ER 5 MG PO TB24: 5.0000 mg | ORAL_TABLET | Freq: Every day | ORAL | Status: DC

## 2010-06-24 NOTE — Telephone Encounter (Signed)
done

## 2010-06-24 NOTE — Telephone Encounter (Signed)
Pt checking status of rx for glipizide, was suppose to be called in at last visit, pt would like 90 day supply, Pt goes to [facility]/[address]

## 2010-09-18 ENCOUNTER — Ambulatory Visit: Payer: No Typology Code available for payment source | Admitting: Family Medicine

## 2010-09-19 ENCOUNTER — Ambulatory Visit (INDEPENDENT_AMBULATORY_CARE_PROVIDER_SITE_OTHER): Payer: No Typology Code available for payment source | Admitting: *Deleted

## 2010-09-19 ENCOUNTER — Ambulatory Visit: Payer: No Typology Code available for payment source

## 2010-09-19 DIAGNOSIS — Z309 Encounter for contraceptive management, unspecified: Secondary | ICD-10-CM

## 2010-09-19 LAB — POCT URINE PREGNANCY: Preg Test, Ur: NEGATIVE

## 2010-09-19 MED ORDER — MEDROXYPROGESTERONE ACETATE 150 MG/ML IM SUSP
150.0000 mg | Freq: Once | INTRAMUSCULAR | Status: AC
  Administered 2010-09-19: 150 mg via INTRAMUSCULAR

## 2010-09-30 ENCOUNTER — Other Ambulatory Visit: Payer: Self-pay | Admitting: Family Medicine

## 2010-09-30 ENCOUNTER — Ambulatory Visit (INDEPENDENT_AMBULATORY_CARE_PROVIDER_SITE_OTHER): Payer: No Typology Code available for payment source | Admitting: Family Medicine

## 2010-09-30 ENCOUNTER — Encounter: Payer: Self-pay | Admitting: Family Medicine

## 2010-09-30 VITALS — BP 129/82 | HR 101 | Temp 98.2°F | Wt 254.2 lb

## 2010-09-30 DIAGNOSIS — Z1231 Encounter for screening mammogram for malignant neoplasm of breast: Secondary | ICD-10-CM

## 2010-09-30 DIAGNOSIS — E1165 Type 2 diabetes mellitus with hyperglycemia: Secondary | ICD-10-CM

## 2010-09-30 DIAGNOSIS — Z23 Encounter for immunization: Secondary | ICD-10-CM

## 2010-09-30 DIAGNOSIS — E118 Type 2 diabetes mellitus with unspecified complications: Secondary | ICD-10-CM

## 2010-09-30 DIAGNOSIS — J309 Allergic rhinitis, unspecified: Secondary | ICD-10-CM

## 2010-09-30 DIAGNOSIS — I10 Essential (primary) hypertension: Secondary | ICD-10-CM

## 2010-09-30 DIAGNOSIS — E669 Obesity, unspecified: Secondary | ICD-10-CM

## 2010-09-30 LAB — POCT GLYCOSYLATED HEMOGLOBIN (HGB A1C): Hemoglobin A1C: 6.5

## 2010-09-30 MED ORDER — FEXOFENADINE HCL 60 MG PO TABS: 60.0000 mg | ORAL_TABLET | Freq: Every day | ORAL | Status: DC

## 2010-09-30 NOTE — Patient Instructions (Addendum)
It is good to see Continue to to try to increase your walking. If we are able to lose weight I will be able to take off some of her medications. I also wanted to try a new antihistamine at night. I have sent him to your pharmacy. I want to see you again in 3 months. At next visit we will get some labs checked in your kidneys liver and cholesterol. Come back the week before your appointment fasting and have your labs drawn.

## 2010-09-30 NOTE — Assessment & Plan Note (Signed)
Patient states that metformin is causing her to have some GI distress at night. Awaiting patient's A1c Will have patient take her morning dose and will hold the evening dose for now we'll continue glipizide followup in 3 months. We'll get A1c at that time and

## 2010-09-30 NOTE — Assessment & Plan Note (Signed)
Patient still has not lost weight. Encouraged to continue to increase her activity and continue to watch her diet. Handout given. Patient's motivation will be to get off medications and to do that she will need to lose weight. We will get fasting lipid panel a week before her next appointment in 3 months.

## 2010-09-30 NOTE — Assessment & Plan Note (Signed)
Goal at this time Will followup in 3 months and we will get labs one week before her appointment.

## 2010-09-30 NOTE — Assessment & Plan Note (Signed)
Patient still has signs of allergic rhinitis. Both her to continue the Flonase at this time and we'll start her on Allegra. Told her probably to take it at night due to it possibly giving her fatigue as well. She will followup with me in 3 months and see how she's doing.

## 2010-09-30 NOTE — Progress Notes (Signed)
  Subjective:    Patient ID: [name], female    DOB: [birth year], 45 y.o.   MRN: [id number]  HPI  1. Hypertension Blood pressure at home:not checking Blood pressure today: 129/82 Taking Meds: yes Side effects:no ROS: Denies headache visual changes nausea, vomiting, chest pain or abdominal pain or shortness of breath.  2. Diabetes:  High at home: not checking Low at home: not checking Taking medications: yes Side effects: Patient states her evening dose of metformin has caused her some GI distress and has not been taking it on a regular basis. ROS: denies fever, chills, dizziness, loss of conscieness, polyuria poly dipsia numbness or tingling in extremities or chest pain. Lab Results  Component Value Date   HGBA1C 6.5 06/19/2010    Allergies: The patient has been taking her Flonase but at night due to it making her tired. Patient states she was not able to get allergy medication because it was not covered by her insurance. Patient states mid-day she does feel like she has some drainage and becomes hoarse patient though denies any type of trouble with swallowing or coughing.  Overweight-  Been trying to lose weight by walking and watching diet A1c today 6.5 doing well, walk 3 times a week for 30 minutes a day. Wt today 254, goal weight of 225 in the long run.   Review of Systems  Denies fever, chills, nausea vomiting abdominal pain, dysuria, chest pain, shortness of breath dyspnea on exertion or numbness in extremities Past medical history, social, surgical and family history all reviewed.      Objective:   Physical Exam  General Appearance:    Alert, cooperative, no distress, appears stated age  Head:    Normocephalic, without obvious abnormality, atraumatic  Eyes:    PERRL, conjunctiva/corneas clear, EOM's intact,  Ears:    Normal TM's and external ear canals  Nose:   Nares normal, septum midline, mucosa normal, no drainage    or sinus tenderness  Throat:   Lips,  mucosa, and tongue normal; teeth and gums normal  Neck:   Supple, symmetrical, trachea midline, no adenopathy;    thyroid:  no enlargement/tenderness/nodules; no carotid   bruit or JVD  Back:     Symmetric, no curvature, ROM normal, no CVA tenderness  Lungs:     Clear to auscultation bilaterally, respirations unlabored   Heart:    Regular rate and rhythm, S1 and S2 normal, no murmur, rub   or gallop  Abdomen:     Soft, non-tender, bowel sounds active all four quadrants,    no masses, no organomegaly  Extremities:  Extremities normal, atraumatic, no cyanosis trace edema  Pulses:   2+ and symmetric all extremities  Skin:   Skin color, texture, turgor normal,      Assessment & Plan:

## 2010-09-30 NOTE — Progress Notes (Signed)
Addended by: Jone Baseman D on: 09/30/2010 12:01 PM   Modules accepted: Orders

## 2010-10-01 ENCOUNTER — Telehealth: Payer: Self-pay | Admitting: Family Medicine

## 2010-10-01 NOTE — Telephone Encounter (Signed)
Called and left message.

## 2010-10-01 NOTE — Telephone Encounter (Signed)
Ms. Mccullar still waiting for phone call for A1C results.  Please call asap

## 2010-10-01 NOTE — Telephone Encounter (Signed)
Will forward to Dr.Smith

## 2010-10-08 ENCOUNTER — Ambulatory Visit
Admission: RE | Admit: 2010-10-08 | Discharge: 2010-10-08 | Disposition: A | Discharge status: Home / Self Care | Payer: No Typology Code available for payment source | Source: Ambulatory Visit | Attending: Family Medicine | Admitting: Family Medicine

## 2010-10-08 DIAGNOSIS — Z1231 Encounter for screening mammogram for malignant neoplasm of breast: Secondary | ICD-10-CM

## 2010-12-08 ENCOUNTER — Ambulatory Visit (INDEPENDENT_AMBULATORY_CARE_PROVIDER_SITE_OTHER): Payer: No Typology Code available for payment source | Admitting: *Deleted

## 2010-12-08 ENCOUNTER — Other Ambulatory Visit: Payer: Self-pay | Admitting: Family Medicine

## 2010-12-08 DIAGNOSIS — Z309 Encounter for contraceptive management, unspecified: Secondary | ICD-10-CM

## 2010-12-08 MED ORDER — MEDROXYPROGESTERONE ACETATE 150 MG/ML IM SUSP: 150.0000 mg | INTRAMUSCULAR | Status: DC

## 2010-12-08 MED ORDER — MEDROXYPROGESTERONE ACETATE 150 MG/ML IM SUSP
150.0000 mg | Freq: Once | INTRAMUSCULAR | Status: AC
  Administered 2010-12-08: 150 mg via INTRAMUSCULAR

## 2010-12-29 ENCOUNTER — Ambulatory Visit (INDEPENDENT_AMBULATORY_CARE_PROVIDER_SITE_OTHER): Payer: No Typology Code available for payment source

## 2010-12-29 DIAGNOSIS — H1030 Unspecified acute conjunctivitis, unspecified eye: Secondary | ICD-10-CM

## 2010-12-29 DIAGNOSIS — J209 Acute bronchitis, unspecified: Secondary | ICD-10-CM

## 2011-01-23 ENCOUNTER — Telehealth: Payer: Self-pay | Admitting: Family Medicine

## 2011-01-23 MED ORDER — GLIPIZIDE ER 5 MG PO TB24: 5.0000 mg | ORAL_TABLET | Freq: Every day | ORAL | Status: DC

## 2011-01-23 NOTE — Telephone Encounter (Signed)
Please refill glipizide.  Pt doublebooked at 8:30 slot on 1/11 to be here at 8:15 for refill f/u [facility]

## 2011-01-23 NOTE — Telephone Encounter (Signed)
Done

## 2011-01-30 ENCOUNTER — Ambulatory Visit (INDEPENDENT_AMBULATORY_CARE_PROVIDER_SITE_OTHER): Payer: No Typology Code available for payment source | Admitting: Family Medicine

## 2011-01-30 ENCOUNTER — Encounter: Payer: Self-pay | Admitting: Family Medicine

## 2011-01-30 VITALS — BP 138/81 | HR 90 | Temp 98.4°F | Ht 60.0 in | Wt 254.8 lb

## 2011-01-30 DIAGNOSIS — I10 Essential (primary) hypertension: Secondary | ICD-10-CM

## 2011-01-30 DIAGNOSIS — E1165 Type 2 diabetes mellitus with hyperglycemia: Secondary | ICD-10-CM

## 2011-01-30 DIAGNOSIS — J309 Allergic rhinitis, unspecified: Secondary | ICD-10-CM

## 2011-01-30 MED ORDER — OMEPRAZOLE 20 MG PO CPDR: 20.0000 mg | DELAYED_RELEASE_CAPSULE | Freq: Every day | ORAL | Status: DC

## 2011-01-30 MED ORDER — FLUTICASONE PROPIONATE 50 MCG/ACT NA SUSP: 2.0000 | Freq: Every day | NASAL | Status: DC

## 2011-01-30 MED ORDER — CETIRIZINE HCL 10 MG PO TABS: 10.0000 mg | ORAL_TABLET | Freq: Every day | ORAL | Status: DC

## 2011-01-30 NOTE — Assessment & Plan Note (Signed)
Borderline elevation today. Patient though once again not sticking to her major regimen. Patient is apprehensive about starting any other medication at this time. Will recheck again in 3 months. Patient knows of red flags to look out for.

## 2011-01-30 NOTE — Progress Notes (Signed)
  Subjective:    Patient ID: [name], female    DOB: 11-Oct-1965, 46 y.o.   MRN: [id number]  Diabetes   1. Hypertension Blood pressure at home:not checking Blood pressure today: 130/82 Taking Meds: yes Side effects:no ROS: Denies headache visual changes nausea, vomiting, chest pain or abdominal pain or shortness of breath.  2. Diabetes:  High at home: not checking Low at home: not checking Taking medications: yes Side effects: Patient states her evening dose of metformin has caused her some GI distress and has not been taking it on a regular basis. ROS: denies fever, chills, dizziness, loss of conscieness, polyuria poly dipsia numbness or tingling in extremities or chest pain. Lab Results  Component Value Date   HGBA1C 6.9 01/30/2011    Allergies: The patient has been taking her Flonase . Patient is still having a cough though at baseline. Patient unable to get the Allegra do to insurance. Patient has not tried any other over-the-counter. Patient was seen by urgent care during the flu outbreak at work and was given Z-Pak which did help. Patient though would like some other type of antihistamine to help due to her still having a chronic cough. Patient denies any fevers chills shortness of breath with activity or numbness in the extremity is.  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 Patient states that at the end of the day she is importantly still too tired to work out some of the time. Patient also got off her regimen during the holidays but anticipates becoming more religiously with her workout routine.  Review of Systems  Denies fever, chills, nausea vomiting abdominal pain, dysuria, chest pain, shortness of breath dyspnea on exertion or numbness in extremities Past medical history, social, surgical and family history all reviewed.      Objective:   Physical Exam  General  Appearance:    Alert, cooperative, no distress, appears stated age  Head:    Normocephalic, without obvious abnormality, atraumatic  Eyes:    PERRL, conjunctiva/corneas clear, EOM's intact,  Ears:    Normal TM's and external ear canals  Nose:   Nares normal, septum midline, mucosa normal, no drainage    or sinus tenderness  Throat:   Lips, mucosa, and tongue normal; teeth and gums normal  Neck:   Supple, symmetrical, trachea midline, no adenopathy;    thyroid:  no enlargement/tenderness/nodules; no carotid   bruit or JVD  Back:     Symmetric, no curvature, ROM normal, no CVA tenderness  Lungs:     Clear to auscultation bilaterally, respirations unlabored   Heart:    Regular rate and rhythm, S1 and S2 normal, no murmur, rub   or gallop  Abdomen:     Soft, non-tender, bowel sounds active all four quadrants,    no masses, no organomegaly  Extremities:  Extremities normal, atraumatic, no cyanosis +1 edema with mild venous stasis bilaterally   Pulses:   2+ and symmetric all extremities  Skin:   Skin color, texture, turgor normal,      Assessment & Plan:

## 2011-01-30 NOTE — Assessment & Plan Note (Signed)
Continue current regimen. No significant changes needed no signs of infection. Discussed many pop and other homeopathic type treatment options to.

## 2011-01-30 NOTE — Patient Instructions (Signed)
It is great to see you. I want you to try Zyrtec and Flonase for your allergies and postnasal drip. For your cough please try gargling with salt water as well as a teaspoon of honey. Your A1c is 6.9 today. This is still good but we got a continue to watch it. I have refilled your other medications that were needed. I when she to come back in 3-4 months.

## 2011-01-30 NOTE — Assessment & Plan Note (Signed)
Patient's A1c today was 6.9. This is minorly elevated from last visit. Encourage patient to continue diet and exercise did give patient some leniency secondary to the holidays. No changes in medication at this time recheck A1c in 3 months.

## 2011-02-12 ENCOUNTER — Ambulatory Visit (INDEPENDENT_AMBULATORY_CARE_PROVIDER_SITE_OTHER): Payer: No Typology Code available for payment source

## 2011-02-12 DIAGNOSIS — J019 Acute sinusitis, unspecified: Secondary | ICD-10-CM

## 2011-03-09 ENCOUNTER — Ambulatory Visit (INDEPENDENT_AMBULATORY_CARE_PROVIDER_SITE_OTHER): Payer: No Typology Code available for payment source | Admitting: *Deleted

## 2011-03-09 DIAGNOSIS — Z309 Encounter for contraceptive management, unspecified: Secondary | ICD-10-CM

## 2011-03-09 MED ORDER — MEDROXYPROGESTERONE ACETATE 150 MG/ML IM SUSP
150.0000 mg | Freq: Once | INTRAMUSCULAR | Status: AC
  Administered 2011-03-09: 150 mg via INTRAMUSCULAR

## 2011-03-09 NOTE — Progress Notes (Signed)
In for Depo  today . Next depo due May 6 thru Jun 08, 2011.

## 2011-03-31 ENCOUNTER — Telehealth: Payer: Self-pay | Admitting: Family Medicine

## 2011-03-31 NOTE — Telephone Encounter (Signed)
Patient is calling to speak to Dr. Katrinka Blazing about the reoccurring pain in leg that she discussed with him before.

## 2011-04-01 NOTE — Telephone Encounter (Signed)
Discussed with patient. She is having some pain of the right lower leg pain starting in the near the ankle and having increased swelling patient denies that is red denies any type of injury. She denies any type of chest pain or shortness of breath. Told her that I would like to see her though before we do anything over the phone. She's has been a gradual onset and not anything that was acute. I would like patient to come in to be seen.  Patient will be coming Tuesday at 8:45 AM. I'm going to forward this to our wonderful front office staff who can double booked this spot for me.

## 2011-04-01 NOTE — Telephone Encounter (Signed)
Spoke with patient she states MD to told her call back if right leg pain got any worse, and patient states its worse now and darker, will forward to MD.

## 2011-04-07 ENCOUNTER — Encounter: Payer: Self-pay | Admitting: Family Medicine

## 2011-04-07 ENCOUNTER — Ambulatory Visit (INDEPENDENT_AMBULATORY_CARE_PROVIDER_SITE_OTHER): Payer: No Typology Code available for payment source | Admitting: Family Medicine

## 2011-04-07 VITALS — BP 119/79 | HR 103 | Temp 98.6°F | Ht 60.0 in | Wt 258.0 lb

## 2011-04-07 DIAGNOSIS — R600 Localized edema: Secondary | ICD-10-CM

## 2011-04-07 DIAGNOSIS — E669 Obesity, unspecified: Secondary | ICD-10-CM

## 2011-04-07 DIAGNOSIS — I10 Essential (primary) hypertension: Secondary | ICD-10-CM

## 2011-04-07 DIAGNOSIS — E1165 Type 2 diabetes mellitus with hyperglycemia: Secondary | ICD-10-CM

## 2011-04-07 DIAGNOSIS — E118 Type 2 diabetes mellitus with unspecified complications: Secondary | ICD-10-CM

## 2011-04-07 DIAGNOSIS — R609 Edema, unspecified: Secondary | ICD-10-CM

## 2011-04-07 LAB — COMPREHENSIVE METABOLIC PANEL
AST: 11 U/L (ref 0–37)
Albumin: 3.7 g/dL (ref 3.5–5.2)
BUN: 8 mg/dL (ref 6–23)
CO2: 23 mEq/L (ref 19–32)
Calcium: 8.8 mg/dL (ref 8.4–10.5)
Chloride: 103 mEq/L (ref 96–112)
Creat: 0.82 mg/dL (ref 0.50–1.10)
Glucose, Bld: 157 mg/dL — ABNORMAL HIGH (ref 70–99)
Potassium: 4 mEq/L (ref 3.5–5.3)

## 2011-04-07 LAB — LIPID PANEL
Cholesterol: 136 mg/dL (ref 0–200)
HDL: 38 mg/dL — ABNORMAL LOW (ref >39)
Total CHOL/HDL Ratio: 3.6 Ratio

## 2011-04-07 MED ORDER — FUROSEMIDE 20 MG PO TABS: 20.0000 mg | ORAL_TABLET | Freq: Every day | ORAL | Status: DC

## 2011-04-07 MED ORDER — TRAMADOL HCL 50 MG PO TABS: 50.0000 mg | ORAL_TABLET | Freq: Three times a day (TID) | ORAL | Status: AC | PRN

## 2011-04-07 NOTE — Assessment & Plan Note (Signed)
Patient appears to be doing much better. Patient does have an A1c that is pending at this time. Patient has had history of noncompliant with her medications but mostly we will see the patient still has a hemoglobin A1c less than 7. Followup in 3 months. Continue current therapy.

## 2011-04-07 NOTE — Assessment & Plan Note (Signed)
At goal we'll make no changes made patient aware though the Lasix that she might have a little bit of orthostatic hypotension and to be careful when standing up initially.

## 2011-04-07 NOTE — Progress Notes (Signed)
  Subjective:    Patient ID: [name], female    DOB: 1965/07/03, 46 y.o.   MRN: [id number]  Leg Pain   Diabetes  1. Hypertension Blood pressure at home:not checking Blood pressure today: 119/72 Taking Meds: yes Side effects:no ROS: Denies headache visual changes nausea, vomiting, chest pain or abdominal pain or shortness of breath.  2. Diabetes:  High at home: not checking Low at home: not checking Taking medications: yes Side effects:no ROS: denies fever, chills, dizziness, loss of conscieness, polyuria poly dipsia numbness or tingling in extremities or chest pain. Lab Results  Component Value Date   HGBA1C 6.9 01/30/2011    Allergies: The patient has been taking her Flonase . Did have a cold where she did have to go to urgent care approximately 2 weeks ago since that time has been feeling better.  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 Patient is doing some 3 times a week and is going to plan on walking another 3 days a week now that the weather is better.  Patient is also complaining of pain bilaterally right greater than left. She states that she has had increasing swelling from previous exam. Patient is also having trouble because she's having darkening of the skin which does not like cosmetically. Denies any long trips denies any new medications denies any shortness of breath or chest pain. No history of blood clots patient is on Depo for her birth control.   Review of Systems  Denies fever, chills, nausea vomiting abdominal pain, dysuria, chest pain, shortness of breath dyspnea on exertion or numbness in extremities Past medical history, social, surgical and family history all reviewed.      Objective:   Physical Exam  vitals reviewed General Appearance:    Alert, cooperative, no distress, appears stated age obese   Head:    Normocephalic, without obvious abnormality,  atraumatic  Eyes:    PERRL, conjunctiva/corneas clear, EOM's intact,  Ears:    Normal TM's and external ear canals  Nose:   Nares normal, septum midline, mucosa normal, no drainage    or sinus tenderness  Throat:   Lips, mucosa, and tongue normal; teeth and gums normal  Neck:   Supple, symmetrical, trachea midline, no adenopathy;    thyroid:  no enlargement/tenderness/nodules; no carotid   bruit or JVD  Back:     Symmetric, no curvature, ROM normal, no CVA tenderness  Lungs:     Clear to auscultation bilaterally, respirations unlabored   Heart:    Regular rate and rhythm, S1 and S2 normal, no murmur, rub   or gallop  Abdomen:     Soft, non-tender, bowel sounds active all four quadrants,    no masses, no organomegaly  Extremities:  Extremities normal, atraumatic, no cyanosis +2 edema with mild venous stasis bilaterally right greater than left no erythema patient minimally tender   Pulses:   2+ and symmetric all extremities  Skin:   Skin color, texture, turgor normal,      Assessment & Plan:

## 2011-04-07 NOTE — Assessment & Plan Note (Signed)
Encourage patient to continue her exercise program. In addition to this try to watch her food intake patient will return for Pap smear at that time we will discuss in more detail with patient can do to try to make improvements.

## 2011-04-07 NOTE — Patient Instructions (Addendum)
Good to see you. Try Aqua Glycolic acid you can get from the pharmacy, sometimes there are coupons if he can find it. Also he should try to wear TED hose daily and try to keep your feet up whenever you are off of them. I am also going to give the Lasix to take daily for the next week and then as needed for the swelling. If for some reason the pain gets worse or he has more swelling please come back and see me. I will get labs today and call you with the results. I when she to come back when you are ready to have your Pap smear.

## 2011-04-07 NOTE — Assessment & Plan Note (Signed)
Discussed with patient at length, patient does not have any red flags such as chest pain shortness of breath erythema patient has swelling bilaterally and not unilaterally. Patient though is tender more over the right than the left patient has good distal pulses do not think there is any concern for a DVT at this time. We will treat with aqua glycolic acid for the hemosiderin deposits, discuss with her that this will take a significant amount time to resolve. We will do Lasix 20 mg daily for the first week then as needed to try to decrease the swelling. TED hose daily Return in one month.

## 2011-04-07 NOTE — Progress Notes (Signed)
Addended by: Judi Saa on: 04/07/2011 10:08 AM   Modules accepted: Orders

## 2011-04-13 ENCOUNTER — Telehealth: Payer: Self-pay | Admitting: Family Medicine

## 2011-04-13 NOTE — Telephone Encounter (Signed)
Pt states she is not any better from last week - leg still sore and tender and would like to speak with Dr Katrinka Blazing without having to come in.

## 2011-04-13 NOTE — Telephone Encounter (Signed)
Pt informed and agreeable. Gailya Tauer Dawn  

## 2011-04-13 NOTE — Telephone Encounter (Signed)
On vacation until wednesday,  It will get better would continue to wear the compression TED hose,  Would consider trying to change shoes and see if that helps as well.  Consider foot massager as well when at home.  Have patient call again on Wed and see if she is improving.

## 2011-04-13 NOTE — Telephone Encounter (Signed)
Spoke with pt, states that the swelling has gone down a little, but if she does not take the tramadol then she still feels the pulling, stinging pain.  States "It feels like the vein is being pulled"  Will forward to MD . Milas Gain, Maryjo Rochester

## 2011-04-15 ENCOUNTER — Telehealth: Payer: Self-pay | Admitting: Family Medicine

## 2011-04-15 DIAGNOSIS — R600 Localized edema: Secondary | ICD-10-CM

## 2011-04-15 NOTE — Telephone Encounter (Signed)
Spoke with patient, doppler scheduled for tomm (04/16/11) at 10am at [facility]. She would like to speak with PCP before she has this done. Will forward to PCP.

## 2011-04-15 NOTE — Telephone Encounter (Signed)
Called patient back told her at this time would consider doing an ultrasound to rule out any type of DVT. Patient did not pick up the phone had to leave a message. If patient calls back tell her I would like her to schedule an appointment As well as I will put a standing order in for a ultrasound in case she calls back.

## 2011-04-15 NOTE — Telephone Encounter (Signed)
Still having problems with her leg and wants to know what to do now.

## 2011-04-15 NOTE — Telephone Encounter (Signed)
Patient still wants to speak with Dr. Katrinka Blazing.

## 2011-04-16 ENCOUNTER — Ambulatory Visit (HOSPITAL_COMMUNITY)
Admission: RE | Admit: 2011-04-16 | Discharge: 2011-04-16 | Disposition: A | Discharge status: Home / Self Care | Payer: No Typology Code available for payment source | Source: Ambulatory Visit | Attending: Family Medicine | Admitting: Family Medicine

## 2011-04-16 ENCOUNTER — Telehealth: Payer: Self-pay | Admitting: Family Medicine

## 2011-04-16 ENCOUNTER — Other Ambulatory Visit: Payer: Self-pay | Admitting: Family Medicine

## 2011-04-16 DIAGNOSIS — M7989 Other specified soft tissue disorders: Secondary | ICD-10-CM

## 2011-04-16 DIAGNOSIS — R600 Localized edema: Secondary | ICD-10-CM

## 2011-04-16 DIAGNOSIS — M79604 Pain in right leg: Secondary | ICD-10-CM

## 2011-04-16 MED ORDER — ASPIRIN EC 81 MG PO TBEC: 162.0000 mg | DELAYED_RELEASE_TABLET | Freq: Every day | ORAL | Status: DC

## 2011-04-16 MED ORDER — DICLOFENAC SODIUM 1 % TD GEL: 1.0000 "application " | Freq: Two times a day (BID) | TRANSDERMAL | Status: DC

## 2011-04-16 NOTE — Telephone Encounter (Signed)
Received a call from vascular lab. Patient was positive for superficial vein phlebitis. At this time we'll treat her as low risk with this being less than 5 cm in length and a low clot burden. We will do Voltaren Gel topically and 162 mg of aspirin daily.

## 2011-04-16 NOTE — Telephone Encounter (Signed)
Called patient back regarding patient's lower leg pain. Discussed with patient at this time it appears that the edema has gone down tremendously since she started the Lasix. Patient is also using the TED hose daily which has helped but she still is complaining of pain mostly in the morning that is relieved by tramadol but she does not want to take pain meds on a regular basis. At this time will start Voltaren gel topically, get the venous Doppler to rule out clot in addition to this if it continues to occur I would consider sending her to sports medicine for an ultrasound to see if there's any type of muscle strain or tear that is contributing to this pain. Patient is to followup in the next 2 weeks.

## 2011-04-16 NOTE — Telephone Encounter (Signed)
Vascular lab calling because Dr. Katrinka Blazing will need to change order for doppler that was ordered for today.  Needs to be changed from arterial doppler to venous duplex in order to rule out DVT.  Will route note to Dr. Katrinka Blazing.  Gaylene Brooks, RN

## 2011-04-22 ENCOUNTER — Telehealth: Payer: Self-pay | Admitting: Family Medicine

## 2011-04-22 NOTE — Telephone Encounter (Signed)
Pt called to say she had her doppler last week on her leg and was to come back in a week.  She is feeling better, so she just wants to speak with Dr Katrinka Blazing instead of coming in.

## 2011-04-22 NOTE — Telephone Encounter (Signed)
Called patient back told her continue current therapy if it is working.

## 2011-05-26 ENCOUNTER — Ambulatory Visit: Payer: No Typology Code available for payment source | Admitting: Family Medicine

## 2011-06-02 ENCOUNTER — Ambulatory Visit (INDEPENDENT_AMBULATORY_CARE_PROVIDER_SITE_OTHER): Payer: No Typology Code available for payment source | Admitting: *Deleted

## 2011-06-02 DIAGNOSIS — Z309 Encounter for contraceptive management, unspecified: Secondary | ICD-10-CM

## 2011-06-02 MED ORDER — MEDROXYPROGESTERONE ACETATE 150 MG/ML IM SUSP
150.0000 mg | Freq: Once | INTRAMUSCULAR | Status: AC
  Administered 2011-06-02: 150 mg via INTRAMUSCULAR

## 2011-06-03 ENCOUNTER — Other Ambulatory Visit: Payer: Self-pay | Admitting: Family Medicine

## 2011-06-03 MED ORDER — METFORMIN HCL 1000 MG PO TABS: 1000.0000 mg | ORAL_TABLET | Freq: Two times a day (BID) | ORAL | Status: DC

## 2011-06-11 ENCOUNTER — Encounter: Payer: Self-pay | Admitting: Family Medicine

## 2011-06-11 ENCOUNTER — Ambulatory Visit (INDEPENDENT_AMBULATORY_CARE_PROVIDER_SITE_OTHER): Payer: No Typology Code available for payment source | Admitting: Family Medicine

## 2011-06-11 VITALS — BP 87/60 | HR 106 | Temp 98.5°F | Ht 60.0 in | Wt 254.0 lb

## 2011-06-11 DIAGNOSIS — E118 Type 2 diabetes mellitus with unspecified complications: Secondary | ICD-10-CM

## 2011-06-11 DIAGNOSIS — E1165 Type 2 diabetes mellitus with hyperglycemia: Secondary | ICD-10-CM

## 2011-06-11 DIAGNOSIS — E669 Obesity, unspecified: Secondary | ICD-10-CM

## 2011-06-11 DIAGNOSIS — I10 Essential (primary) hypertension: Secondary | ICD-10-CM

## 2011-06-11 LAB — POCT GLYCOSYLATED HEMOGLOBIN (HGB A1C): Hemoglobin A1C: 7.1

## 2011-06-11 MED ORDER — TRIAMCINOLONE ACETONIDE 0.1 % EX CREA: TOPICAL_CREAM | Freq: Two times a day (BID) | CUTANEOUS | Status: DC

## 2011-06-11 NOTE — Assessment & Plan Note (Signed)
Encourage regular exercise routine. Patient declined diabetic nutrition counseling. Discuss with patient at her goal weight should be somewhere around 175 pounds would really like her to lose 10 pounds in the next 3 months. Patient not ready to make a goal weight today for follow up. Marland Kitchen

## 2011-06-11 NOTE — Assessment & Plan Note (Signed)
Patient is doing very well overall. Patient's A1c to go up but she has not been as compliant with her diet and exercise. Patient seems motivated to improve. We'll recheck again in 3 months time. We'll make no changes in medications.

## 2011-06-11 NOTE — Assessment & Plan Note (Signed)
:   Actually a little low. We will discontinue patient's Lasix she was using for the swelling. Patient does have enlarged from the swelling worsening and we'll consider starting it.

## 2011-06-11 NOTE — Progress Notes (Signed)
  Subjective:    Patient ID: [name], female    DOB: [birth year], 46 y.o.   MRN: [id number]  Diabetes  Leg Pain   1. Hypertension Blood pressure at home:not checking Blood pressure today: 87/60 Taking Meds: yes Side effects:no ROS: Denies headache visual changes nausea, vomiting, chest pain or abdominal pain or shortness of breath.  2. Diabetes:  High at home: not checking Low at home: not checking Taking medications: yes Side effects:no ROS: denies fever, chills, dizziness, loss of conscieness, polyuria poly dipsia numbness or tingling in extremities or chest pain. 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 Patient is doing some exercises 3 times a week but has not been doing and as consistently as she should. Wt Readings from Last 3 Encounters:  06/11/11 254 lb (115.214 kg)  04/07/11 258 lb (117.028 kg)  01/30/11 254 lb 12.8 oz (115.577 kg)   has not changed her diet much.  Patient is also complaining of pain bilaterally right greater than left. She states that she has had increasing swelling from previous exam. Patient is also having trouble because she's having darkening of the skin which does not like cosmetically. Denies any long trips denies any new medications denies any shortness of breath or chest pain. No history of blood clots patient is on Depo for her birth control.   Review of Systems  Denies fever, chills, nausea vomiting abdominal pain, dysuria, chest pain, shortness of breath dyspnea on exertion or numbness in extremities Past medical history, social, surgical and family history all reviewed.      Objective:   Physical Exam  vitals reviewed General Appearance:    Alert, cooperative, no distress, appears stated age obese   Head:    Normocephalic, without obvious abnormality, atraumatic  Eyes:     PERRL, conjunctiva/corneas clear, EOM's intact,  Ears:    Normal TM's and external ear canals  Nose:   Nares normal, septum midline, mucosa normal, no drainage    or sinus tenderness  Throat:   Lips, mucosa, and tongue normal; teeth and gums normal  Neck:   Supple, symmetrical, trachea midline, no adenopathy;    thyroid:  no enlargement/tenderness/nodules; no carotid   bruit or JVD  Back:     Symmetric, no curvature, ROM normal, no CVA tenderness  Lungs:     Clear to auscultation bilaterally, respirations unlabored   Heart:    Regular rate and rhythm, S1 and S2 normal, no murmur, rub   or gallop  Abdomen:     Soft, non-tender, bowel sounds active all four quadrants,    no masses, no organomegaly  Extremities:  Extremities normal, atraumatic, no cyanosis +1 edema with mild venous stasis bilaterally right greater than left no erythema patient minimally tender and improving from last visit.  Pulses:   2+ and symmetric all extremities  Skin:   Skin color, texture, turgor normal,      Assessment & Plan:

## 2011-06-11 NOTE — Patient Instructions (Signed)
Good to see you.  Try the cream on your feet and legs Call me and tell me the name of the cream and I will send it in.  Otherwise keep doing what you are doing.   Thank you for 3 great years.

## 2011-06-23 ENCOUNTER — Telehealth: Payer: Self-pay | Admitting: Family Medicine

## 2011-06-23 MED ORDER — TRIAMCINOLONE 0.1 % CREAM:EUCERIN CREAM 1:1: 1.0000 "application " | TOPICAL_CREAM | Freq: Two times a day (BID) | CUTANEOUS | Status: DC

## 2011-06-23 NOTE — Telephone Encounter (Signed)
Done

## 2011-06-23 NOTE — Telephone Encounter (Signed)
Pt called to give name of cream that she wants Dr Katrinka Blazing to call in: Hydrocerin cream [facility] - [address]

## 2011-08-24 ENCOUNTER — Other Ambulatory Visit (HOSPITAL_COMMUNITY)
Admission: RE | Admit: 2011-08-24 | Discharge: 2011-08-24 | Disposition: A | Discharge status: Home / Self Care | Payer: No Typology Code available for payment source | Source: Ambulatory Visit | Attending: Family Medicine | Admitting: Family Medicine

## 2011-08-24 ENCOUNTER — Encounter: Payer: Self-pay | Admitting: Family Medicine

## 2011-08-24 ENCOUNTER — Ambulatory Visit (INDEPENDENT_AMBULATORY_CARE_PROVIDER_SITE_OTHER): Payer: No Typology Code available for payment source | Admitting: Family Medicine

## 2011-08-24 VITALS — BP 103/73 | HR 103 | Temp 99.0°F | Ht 60.0 in | Wt 246.0 lb

## 2011-08-24 DIAGNOSIS — E669 Obesity, unspecified: Secondary | ICD-10-CM

## 2011-08-24 DIAGNOSIS — Z113 Encounter for screening for infections with a predominantly sexual mode of transmission: Secondary | ICD-10-CM | POA: Insufficient documentation

## 2011-08-24 DIAGNOSIS — Z309 Encounter for contraceptive management, unspecified: Secondary | ICD-10-CM

## 2011-08-24 DIAGNOSIS — Z20828 Contact with and (suspected) exposure to other viral communicable diseases: Secondary | ICD-10-CM

## 2011-08-24 DIAGNOSIS — Z23 Encounter for immunization: Secondary | ICD-10-CM

## 2011-08-24 DIAGNOSIS — IMO0002 Reserved for concepts with insufficient information to code with codable children: Secondary | ICD-10-CM

## 2011-08-24 DIAGNOSIS — Z01419 Encounter for gynecological examination (general) (routine) without abnormal findings: Secondary | ICD-10-CM | POA: Insufficient documentation

## 2011-08-24 DIAGNOSIS — E1165 Type 2 diabetes mellitus with hyperglycemia: Secondary | ICD-10-CM

## 2011-08-24 DIAGNOSIS — Z124 Encounter for screening for malignant neoplasm of cervix: Secondary | ICD-10-CM

## 2011-08-24 MED ORDER — PNEUMOCOCCAL VAC POLYVALENT 25 MCG/0.5ML IJ INJ: 0.5000 mL | INJECTION | INTRAMUSCULAR | Status: AC

## 2011-08-24 MED ORDER — MEDROXYPROGESTERONE ACETATE 150 MG/ML IM SUSP
150.0000 mg | Freq: Once | INTRAMUSCULAR | Status: AC
  Administered 2011-08-24: 150 mg via INTRAMUSCULAR

## 2011-08-24 MED ORDER — TRIAMCINOLONE ACETONIDE 0.1 % EX CREA: TOPICAL_CREAM | Freq: Two times a day (BID) | CUTANEOUS | Status: DC

## 2011-08-24 NOTE — Patient Instructions (Addendum)
Dear [name],   It was great to see you today. Thank you for coming to clinic. Please read below regarding the issues that we discussed.   1. You received your depo shot and pneumonia vaccine.  2. Keep up the great job with all of your exercise and healthy lifestyle goals. You have lost 12 lbs in 5 months! Remember we have resources available to you if you need them.  3. I have prescribed triamcinolone for the itchy spots on your leg. I wonder if you may have some small bug bites. Hopefully they will improve once the summer is over.   Please follow up in clinic within 12 weeks for your next diabetes appointment. Please call earlier if you have any questions or concerns.   Sincerely,  Dr. Tana Conch   Health Maintenance Due  Topic Date Due  . Foot Exam  01/12/1978  . Ophthalmology Exam  01/12/1978  . Pap Smear  01/12/1986

## 2011-08-24 NOTE — Progress Notes (Signed)
Subjective:   1. Well woman exam (Birth Control, Concern STIs, pap smear)-patient presents for depo shot. Has been receiving every 3 months. She would like to be screened for STIs including GC/Chlamydia today and HIV/RPR with next blood draw. Last pap in 2010. No history of abnormal.   2. Obesity-patient has lost 12 lbs in 5 months. Multiple changes including cutting out sodas except for occasional use, parking further out, not eating out everyday at lunch and if she does getting a salad, drinking more water, and walking 3x a week consistently. She is motivated to continue changes.   3. DM Health Maint-encouraged optho visit. Exercise and diet as above, foot exam with next DM visit. Agreeable to pneumovax today.   4. Red spots on legs-has been having trouble this summer with itching of her legs with some specific red patches. Was given triamcinolone before for itching with ? Eczema vs irritant. Patient does admit to spending time outside and may be getting bit by mosquitos/insects but unclear.   ROS--See HPI  Past Medical History-smoking status noted: nonsmoker.  Reviewed problem list.  Medications- reviewed and updated Chief complaint-noted  Objective:  Gen: NAD CV: RRR no mrg Lungs: CTAB MSK: trace edema GU: small skin tag noted over area of labia majora (patient states present since she had a cyst in that area that popped), otherwise normal external exam and internal vaginal/cervical exam. Bimanual exam without any abnormality.  Skin: warm, dry. Several erythematous patches with some associated hyperpigmentation on bilateral legs.   Assessment/Plan: See problem oriented charted  HIV/RPR with next blood draw.  Appears to have bug bites on leg-triamcinolone for itching as previously used.

## 2011-08-25 DIAGNOSIS — Z01419 Encounter for gynecological examination (general) (routine) without abnormal findings: Secondary | ICD-10-CM | POA: Insufficient documentation

## 2011-08-25 NOTE — Assessment & Plan Note (Addendum)
Pneumovax given. Will need repeat in 5 years. Diet and exercise from obesity.  A1c obtained today and near goal 7.

## 2011-08-25 NOTE — Assessment & Plan Note (Signed)
PAP smear today. No history abnormal Pap. GC/Chlamydia testing today. Will get HIV/RPR with next blood draw. Received Depo today for birth control.

## 2011-08-25 NOTE — Assessment & Plan Note (Addendum)
Appropriate weight loss and lifestyle changes. Continue per AVS. Would consider nutrition or lifestyle coach referral if weight loss does not continue-patient declines at this time as she is progressing.

## 2011-08-27 ENCOUNTER — Encounter: Payer: Self-pay | Admitting: Family Medicine

## 2011-08-31 ENCOUNTER — Encounter: Payer: No Typology Code available for payment source | Admitting: Family Medicine

## 2011-09-07 ENCOUNTER — Other Ambulatory Visit: Payer: Self-pay | Admitting: *Deleted

## 2011-09-07 MED ORDER — GLIPIZIDE ER 5 MG PO TB24: 5.0000 mg | ORAL_TABLET | Freq: Every day | ORAL | Status: DC

## 2011-09-25 ENCOUNTER — Other Ambulatory Visit: Payer: Self-pay | Admitting: *Deleted

## 2011-09-25 MED ORDER — LISINOPRIL-HYDROCHLOROTHIAZIDE 20-25 MG PO TABS: 1.0000 | ORAL_TABLET | Freq: Every day | ORAL | Status: DC

## 2011-09-25 NOTE — Telephone Encounter (Signed)
Will forward to Dr  Hunter 

## 2011-09-25 NOTE — Telephone Encounter (Signed)
Patient is calling to let her md know that she doesn't have any of her Lisinopril left.

## 2011-09-28 MED ORDER — LISINOPRIL-HYDROCHLOROTHIAZIDE 20-25 MG PO TABS: 1.0000 | ORAL_TABLET | Freq: Every day | ORAL | Status: DC

## 2011-09-28 NOTE — Addendum Note (Signed)
Addended by: Shelva Majestic on: 09/28/2011 08:34 AM   Modules accepted: Orders

## 2011-09-28 NOTE — Telephone Encounter (Signed)
Refilled Lisinopril/HCTZ.  

## 2011-11-13 ENCOUNTER — Ambulatory Visit (INDEPENDENT_AMBULATORY_CARE_PROVIDER_SITE_OTHER): Payer: No Typology Code available for payment source | Admitting: *Deleted

## 2011-11-13 DIAGNOSIS — Z309 Encounter for contraceptive management, unspecified: Secondary | ICD-10-CM

## 2011-11-13 DIAGNOSIS — Z23 Encounter for immunization: Secondary | ICD-10-CM

## 2011-11-13 MED ORDER — MEDROXYPROGESTERONE ACETATE 150 MG/ML IM SUSP
150.0000 mg | Freq: Once | INTRAMUSCULAR | Status: AC
  Administered 2011-11-13: 150 mg via INTRAMUSCULAR

## 2011-11-13 NOTE — Progress Notes (Signed)
Next Depo due Jan10 through Feb 12, 2012

## 2011-12-11 ENCOUNTER — Encounter: Payer: Self-pay | Admitting: Family Medicine

## 2011-12-11 ENCOUNTER — Ambulatory Visit (INDEPENDENT_AMBULATORY_CARE_PROVIDER_SITE_OTHER): Payer: No Typology Code available for payment source | Admitting: Family Medicine

## 2011-12-11 VITALS — BP 108/76 | HR 107 | Temp 99.3°F | Ht 60.0 in | Wt 247.6 lb

## 2011-12-11 DIAGNOSIS — R600 Localized edema: Secondary | ICD-10-CM

## 2011-12-11 DIAGNOSIS — R609 Edema, unspecified: Secondary | ICD-10-CM

## 2011-12-11 MED ORDER — TRIAMCINOLONE ACETONIDE 0.5 % EX OINT: TOPICAL_OINTMENT | Freq: Two times a day (BID) | CUTANEOUS | Status: DC

## 2011-12-11 MED ORDER — FUROSEMIDE 40 MG PO TABS: 40.0000 mg | ORAL_TABLET | Freq: Every day | ORAL | Status: DC

## 2011-12-11 NOTE — Assessment & Plan Note (Addendum)
Likely swelling due to obesity vs. Chronic venous stasis. Will treat edema with Lasix 40 mg PO daily x 7 days. Encouraged patient wear compression hose during the day. May consider LE Doppler, but negative Homans, no calf tenderness, swelling at this time (Well's score: 0). Recommended follow up in 1-2 weeks with PCP.

## 2011-12-11 NOTE — Patient Instructions (Addendum)
It was nice to meet you today, Marg[place]rette. Please start taking Lasix 40 mg daily x 7 days. Remember to eat fruits, veggies rich in potassium. For eczema, apply Triamcinolone ointment BID as needed for itching. Call [facility] and find out which dermatologist you can see. Schedule follow up appointment with your PCP in 1-2 weeks.  Venous Stasis and Chronic Venous Insufficiency As people age, the veins located in their legs may weaken and stretch. When veins weaken and lose the ability to pump blood effectively, the condition is called chronic venous insufficiency (CVI) or venous stasis. Almost all veins return blood back to the heart. This happens by:  The force of the heart pumping fresh blood pushes blood back to the heart.  Blood flowing to the heart from the force of gravity. In the deep veins of the legs, blood has to fight gravity and flow upstream back to the heart. Here, the leg muscles contract to pump blood back toward the heart. Vein walls are elastic, and many veins have small valves that only allow blood to flow in one direction. When leg muscles contract, they push inward against the elastic vein walls. This squeezes blood upward, opens the valves, and moves blood toward the heart. When leg muscles relax, the vein wall also relaxes and the valves inside the vein close to prevent blood from flowing backward. This method of pumping blood out of the legs is called the venous pump. CAUSES  The venous pump works best while walking and leg muscles are contracting. But when a person sits or stands, blood pressure in leg veins can build. Deep veins are usually able to withstand short periods of inactivity, but long periods of inactivity (and increased pressure) can stretch, weaken, and damage vein walls. High blood pressure can also stretch and damage vein walls. The veins may no longer be able to pump blood back to the heart. Venous hypertension (high blood pressure inside veins) that lasts over  time is a primary cause of CVI. CVI can also be caused by:   Deep vein thrombosis, a condition where a thrombus (blood clot) blocks blood flow in a vein.  Phlebitis, an inflammation of a superficial vein that causes a blood clot to form. Other risk factors for CVI may include:   Heredity.  Obesity.  Pregnancy.  Sedentary lifestyle.  Smoking.  Jobs requiring long periods of standing or sitting in one place.  Age and gender:  Women in their 41's and 82's and men in their 37's are more prone to developing CVI. SYMPTOMS  Symptoms of CVI may include:   Varicose veins.  Ulceration or skin breakdown.  Lipodermatosclerosis, a condition that affects the skin just above the ankle, usually on the inside surface. Over time the skin becomes brown, smooth, tight and often painful. Those with this condition have a high risk of developing skin ulcers.  Reddened or discolored skin on the leg.  Swelling. DIAGNOSIS  Your caregiver can diagnose CVI after performing a careful medical history and physical examination. To confirm the diagnosis, the following tests may also be ordered:   Duplex ultrasound.  Plethysmography (tests blood flow).  Venograms (x-ray using a special dye). TREATMENT The goals of treatment for CVI are to restore a person to an active life and to minimize pain or disability. Typically, CVI does not pose a serious threat to life or limb, and with proper treatment most people with this condition can continue to lead active lives. In most cases, mild CVI can be  treated on an outpatient basis with simple procedures. Treatment methods include:   Elastic compression socks.  Sclerotherapy, a procedure involving an injection of a material that "dissolves" the damaged veins. Other veins in the network of blood vessels take over the function of the damaged veins.  Vein stripping (an older procedure less commonly used).  Laser Ablation surgery.  Valve repair. HOME CARE  INSTRUCTIONS   Elastic compression socks must be worn every day. They can help with symptoms and lower the chances of the problem getting worse, but they do not cure the problem.  Only take over-the-counter or prescription medicines for pain, discomfort, or fever as directed by your caregiver.  Your caregiver will review your other medications with you. SEEK MEDICAL CARE IF:   You are confused about how to take your medications.  There is redness, swelling, or increasing pain in the affected area.  There is a red streak or line that extends up or down from the affected area.  There is a breakdown or loss of skin in the affected area, even if the breakdown is small.  You develop an unexplained oral temperature above 102 F (38.9 C).  There is an injury to the affected area. SEEK IMMEDIATE MEDICAL CARE IF:   There is an injury and open wound to the affected area.  Pain is not adequately relieved with pain medication prescribed or becomes severe.  An oral temperature above 102 F (38.9 C) develops.  The foot/ankle below the affected area becomes suddenly numb or the area feels weak and hard to move. MAKE SURE YOU:   Understand these instructions.  Will watch your condition.  Will get help right away if you are not doing well or get worse. Document Released: 05/11/2006 Document Revised: 03/30/2011 Document Reviewed: 07/19/2006 [facility] Patient Information 2013 [place], [place].

## 2011-12-11 NOTE — Progress Notes (Signed)
  Subjective:    Patient ID: [name], female    DOB: 09-Mar-1965, 46 y.o.   MRN: [id number]  HPI  Patient presents to same day appointment for wound check.  Patient has had a long history of bilateral peripheral edema with changes in skin color.  She had a LE doppler performed in March 2013 that was negative for DVT.  Patient denies any worsening pain, swelling, redness of RT leg, but she says the dark skin color will not go away.  Patient complains of dull, achy pain of RT LE that is worse with ambulation, better with rest.  She takes 2 ASA for pain which helps.  Denies any systemic symptoms - fever, chills, NS, vomiting.  No hx of DVT.  Well's criteria score: 0  Review of Systems  Per HPI    Objective:   Physical Exam  Constitutional: No distress.  Musculoskeletal: Normal range of motion.       Negative Homans; negative calf squeeze, LE are symmetrical, 2+ pitting pedal edema from ankles to anterior shin bilaterally; diminished pulses on both LT and RT, but palpable.  Skin:       Dark, hyperpigmented lesions scattered anterior RT leg; small superficial healing ulcer in center, no redness/erythema, no induration          Assessment & Plan:

## 2011-12-15 ENCOUNTER — Other Ambulatory Visit: Payer: Self-pay | Admitting: Family Medicine

## 2011-12-15 DIAGNOSIS — E119 Type 2 diabetes mellitus without complications: Secondary | ICD-10-CM

## 2012-01-01 ENCOUNTER — Ambulatory Visit (INDEPENDENT_AMBULATORY_CARE_PROVIDER_SITE_OTHER): Payer: No Typology Code available for payment source | Admitting: Family Medicine

## 2012-01-01 VITALS — BP 130/80 | HR 90 | Temp 98.6°F | Ht 60.0 in | Wt 248.3 lb

## 2012-01-01 DIAGNOSIS — L723 Sebaceous cyst: Secondary | ICD-10-CM | POA: Insufficient documentation

## 2012-01-01 NOTE — Patient Instructions (Signed)
It was nice to meet you today.  I think the bump on your back was a little blocked gland that is starting to heal itself.  There is still some tissue inflammation around it, but there does not appear to be an infection so we don't need to do any antibiotics.  Keep an eye on that area-- if it starts getting more sore, more red, or gets bigger, please come back so we can look at it again.  If it hasn't healed up in the next 1-2 weeks, I also want you to come back.  Otherwise, see Dr. Durene Cal for your next regular check up.  Have a great Christmas and [place] Ouch!

## 2012-01-01 NOTE — Progress Notes (Signed)
S: Pt comes in today for SDA for bump on her low back.  Patient reports noticing a bump on her back about 1 week ago.  Was growing and growing, but has now started getting smaller over the past 2 days.  Is still there, is hard.  Throbs- about the same over the past week, no better or worse.  Didn't noticed any drainage but thinks there is a scab over it now.  No fevers/chills, no N/V.  Felt a little warm, a small amount of redness.   Of note, patient does have DM, most recent A1c 7.1 in 05/2011, controlled on oral agents.    ROS: Per HPI  History  Smoking status  . Never Smoker   Smokeless tobacco  . Not on file    O:  Filed Vitals:   01/01/12 0836  BP: 130/80  Pulse: 90  Temp: 98.6 F (37 C)    Gen: NAD, obese Skin: 2cm circular area of induration with minimal overlying erythema- no signs of cellulitis + central 0.5cm raised, scabbed area; no drainage; no fluctuance; no warmth of the skin.  Does have some TTP around scabbed area    A/P: 46 y.o. female p/w resolving skin lesion- likely sebaceous cyst  -See problem list -f/u in PRN

## 2012-01-01 NOTE — Assessment & Plan Note (Signed)
Low back, appears to be healing, no obvious/active infection at this time.  No need for antibiotics.  DM under relatively good control on po agents.  Red flags for return including s/s of infection as well as non-resolution of the lesion discussed.  If area is not healing, may need to consider a biopsy to rule out other pathology.

## 2012-01-29 ENCOUNTER — Ambulatory Visit (INDEPENDENT_AMBULATORY_CARE_PROVIDER_SITE_OTHER): Payer: No Typology Code available for payment source | Admitting: *Deleted

## 2012-01-29 DIAGNOSIS — Z309 Encounter for contraceptive management, unspecified: Secondary | ICD-10-CM

## 2012-01-29 MED ORDER — MEDROXYPROGESTERONE ACETATE 150 MG/ML IM SUSP
150.0000 mg | Freq: Once | INTRAMUSCULAR | Status: AC
  Administered 2012-01-29: 150 mg via INTRAMUSCULAR

## 2012-01-29 NOTE — Progress Notes (Signed)
Next depo due March 28 through April 29, 2012.

## 2012-02-19 ENCOUNTER — Encounter: Payer: Self-pay | Admitting: Family Medicine

## 2012-02-19 ENCOUNTER — Ambulatory Visit (INDEPENDENT_AMBULATORY_CARE_PROVIDER_SITE_OTHER): Payer: No Typology Code available for payment source | Admitting: Family Medicine

## 2012-02-19 VITALS — BP 118/78 | HR 72 | Temp 98.2°F | Ht 60.0 in | Wt 245.0 lb

## 2012-02-19 DIAGNOSIS — I10 Essential (primary) hypertension: Secondary | ICD-10-CM

## 2012-02-19 DIAGNOSIS — E119 Type 2 diabetes mellitus without complications: Secondary | ICD-10-CM

## 2012-02-19 DIAGNOSIS — Z2089 Contact with and (suspected) exposure to other communicable diseases: Secondary | ICD-10-CM

## 2012-02-19 DIAGNOSIS — K219 Gastro-esophageal reflux disease without esophagitis: Secondary | ICD-10-CM

## 2012-02-19 DIAGNOSIS — Z20828 Contact with and (suspected) exposure to other viral communicable diseases: Secondary | ICD-10-CM

## 2012-02-19 DIAGNOSIS — J309 Allergic rhinitis, unspecified: Secondary | ICD-10-CM

## 2012-02-19 DIAGNOSIS — Z6841 Body Mass Index (BMI) 40.0 and over, adult: Secondary | ICD-10-CM

## 2012-02-19 DIAGNOSIS — Z202 Contact with and (suspected) exposure to infections with a predominantly sexual mode of transmission: Secondary | ICD-10-CM

## 2012-02-19 LAB — POCT GLYCOSYLATED HEMOGLOBIN (HGB A1C): Hemoglobin A1C: 7

## 2012-02-19 LAB — COMPREHENSIVE METABOLIC PANEL
ALT: 14 U/L (ref 0–35)
AST: 13 U/L (ref 0–37)
Albumin: 4 g/dL (ref 3.5–5.2)
CO2: 23 mEq/L (ref 19–32)
Calcium: 9.6 mg/dL (ref 8.4–10.5)
Chloride: 103 mEq/L (ref 96–112)
Potassium: 4.2 mEq/L (ref 3.5–5.3)
Total Protein: 6.8 g/dL (ref 6.0–8.3)

## 2012-02-19 LAB — HIV ANTIBODY (ROUTINE TESTING W REFLEX): HIV: NONREACTIVE

## 2012-02-19 LAB — LIPID PANEL: Cholesterol: 134 mg/dL (ref 0–200)

## 2012-02-19 LAB — RPR

## 2012-02-19 MED ORDER — GLIPIZIDE ER 5 MG PO TB24: 5.0000 mg | ORAL_TABLET | Freq: Every day | ORAL | Status: DC

## 2012-02-19 MED ORDER — TRIAMCINOLONE ACETONIDE 0.1 % EX CREA: TOPICAL_CREAM | Freq: Two times a day (BID) | CUTANEOUS | Status: DC

## 2012-02-19 MED ORDER — FLUTICASONE PROPIONATE 50 MCG/ACT NA SUSP: 2.0000 | Freq: Every day | NASAL | Status: DC

## 2012-02-19 MED ORDER — OMEPRAZOLE 20 MG PO CPDR: 20.0000 mg | DELAYED_RELEASE_CAPSULE | Freq: Every day | ORAL | Status: DC

## 2012-02-19 MED ORDER — METFORMIN HCL 1000 MG PO TABS: 1000.0000 mg | ORAL_TABLET | Freq: Two times a day (BID) | ORAL | Status: DC

## 2012-02-19 MED ORDER — CETIRIZINE HCL 10 MG PO TABS: 10.0000 mg | ORAL_TABLET | Freq: Every day | ORAL | Status: DC

## 2012-02-19 MED ORDER — LISINOPRIL-HYDROCHLOROTHIAZIDE 20-25 MG PO TABS: 1.0000 | ORAL_TABLET | Freq: Every day | ORAL | Status: DC

## 2012-02-19 NOTE — Assessment & Plan Note (Addendum)
Goals per AVS. Motivational interviewing implied. Initially patient believed she did not need further weight loss as she would look "sickly". Able work to reset expectations.  Referred to Dr. Gerilyn Pilgrim of nutrition for further assistance. F/u in 3 months to see how patient is doing with goals.

## 2012-02-19 NOTE — Patient Instructions (Signed)
1. Diabetes-your a1c was 7.0! That is great news. Slightly down from before. Keep taking current medications.  2. Obesity-I would like you to meet with Dr. Gerilyn Pilgrim to discuss healthy lifestyle choices in hopes to reduce your weight and hopefully get you off of a medication or 2 in the future.   Goals for weight loss 20 lbs in 6 months.  How you will get there: 1. Increase exercise to 5 days from 3 days of walking per week. You talked about getting a [employer] membership to allow you to walk in the winter.  2. Go back to not drinking soda on a daily basis (maybe have 1 every other week or month).  3. Keep up the great work with eating fruit and vegetables. Perhaps increase servings by 1-2 per day.   We are going to check some labs. I will send a letter if normal and call if we need to make any adjustments.   Let's follow up on these changes in 3 months, Dr. Durene Cal

## 2012-02-19 NOTE — Progress Notes (Signed)
Subjective:   1. DIABETES Type II Medications taking and tolerating-yes metformin and glipizide. No side effects except occasional diarrhea Blood Sugars per patient-not checking Diet-salad with most meals, protein shake and fruit for breakfast, 4-5 servings fruits/veggies.  Regular Exercise-walking less as it is cold. Squats every 1/2 hour at work.  Last eye exam-12/11/11 Last foot exam-today Last microalbumin/on ace inhibitor-yes On Aspirin-yes Daily foot monitoring-yes  ROS- (no)Polyuria/Polydipsia/nocturia, (no) Vision changes, (no) feet or hand numbness/pain/tingling. Hypoglycemia symptoms (shaky, sweaty, hungry,  anxious, tremor, palpitations, confusion, behavior change)-no  Hemoglobin a1c:  Lab Results  Component Value Date   HGBA1C 7.0 02/19/2012   HGBA1C 7.1 06/11/2011   HGBA1C 6.9 01/30/2011   2. Obesity- Lost 3 lbs since last visit. Diet and exercise discussed above. No fatigue, weight loss not unexpected, no skin or nail changes.   ROS--See HPI  Past Medical History-hypertension, allergic rhinitis, morbid obesity Reviewed problem list.  Medications- reviewed and updated Chief complaint-noted  Objective: BP 118/78  Pulse 72  Temp 98.2 F (36.8 C) (Oral)  Ht 5' (1.524 m)  Wt 245 lb (111.131 kg)  BMI 47.85 kg/m2 Gen: NAD, resting comfortably CV: RRR no murmurs rubs or gallops Lungs: CTAB no crackles, wheeze, rhonchi Skin: warm, dry Neuro: grossly normal, moves all extremities DM foot exam: 1+ pulse bilaterally (L foot stronger than R slightly), no callous formation, slightly dry, monofilament testing with full sensation.   Assessment/Plan:  Labs ordered: Lipid profile, CMET, HIV/RPR (for follow up from last visit).   >50% of 32 minute office visit spent on counseling for diabetes and obesity.

## 2012-02-19 NOTE — Assessment & Plan Note (Signed)
Well controlled a1c 7.0. Continue current medications-metformin and glipizide. Patient desires to minimize medications so hopes to be able to come off glipizide hopefully-will need nutritionist counseling and further weight loss to help with this goal.

## 2012-02-21 ENCOUNTER — Encounter: Payer: Self-pay | Admitting: Family Medicine

## 2012-04-05 ENCOUNTER — Ambulatory Visit (INDEPENDENT_AMBULATORY_CARE_PROVIDER_SITE_OTHER): Payer: No Typology Code available for payment source | Admitting: Family Medicine

## 2012-04-05 ENCOUNTER — Encounter: Payer: Self-pay | Admitting: Family Medicine

## 2012-04-05 VITALS — BP 116/66 | HR 89 | Temp 98.2°F | Ht 60.0 in | Wt 248.6 lb

## 2012-04-05 DIAGNOSIS — J069 Acute upper respiratory infection, unspecified: Secondary | ICD-10-CM

## 2012-04-05 MED ORDER — MUCINEX DM 30-600 MG PO TB12: 2.0000 | ORAL_TABLET | Freq: Two times a day (BID) | ORAL | Status: DC

## 2012-04-05 NOTE — Progress Notes (Signed)
Subjective:     [name] is a 47 y.o. female who presents for evaluation of symptoms of a URI. Symptoms include cough described as nonproductive, nasal congestion, no  fever, post nasal drip and sore throat. Onset of symptoms was 5 days ago, and has been stable since that time. Treatment to date: antihistamines and ibuprofen. No fever/nausea/vomiting. Occasional chill. Slight headache. Cough is the worst symptom and making it difficult to rest. No shortness of breath or chest pain. Still working during this time.   Medical history: type II DM, morbid obesity, hypertension  Review of Systems Pertinent items are noted in HPI.   Objective:    BP 116/66  Pulse 89  Temp(Src) 98.2 F (36.8 C) (Oral)  Ht 5' (1.524 m)  Wt 248 lb 9.6 oz (112.764 kg)  BMI 48.55 kg/m2 General appearance: alert, cooperative and no distress Head: atraumatic Eyes: negative findings: conjunctivae and sclerae normal and pupils equal, round, reactive to light and accomodation Ears: normal TM's and external ear canals both ears Nose: clear discharge, turbinates swollen, inflamed Throat: slight erythema Lungs: clear to auscultation bilaterally Heart: regular rate and rhythm, S1, S2 normal, no murmur, click, rub or gallop Extremities: edema 1+ pitting Skin: Skin color, texture, turgor normal. No rashes or lesions   Assessment:    viral upper respiratory illness   Plan:    Discussed diagnosis and treatment of URI. Follow up as needed. Rx given for robitussen-dm 2 tabs BID.   Honey at bedtime if needs additional help (DM with a1c 7.1) Rest and hydration advised.

## 2012-04-05 NOTE — Patient Instructions (Signed)
You can also try honey at bedtime!   Upper Respiratory Infection, Adult An upper respiratory infection (URI) is also known as the common cold. It is often caused by a type of germ (virus). Colds are easily spread (contagious). You can pass it to others by kissing, coughing, sneezing, or drinking out of the same glass. Usually, you get better in 1 or 2 weeks.  HOME CARE   Only take medicine as told by your doctor.  Use a warm mist humidifier or breathe in steam from a hot shower.  Drink enough water and fluids to keep your pee (urine) clear or pale yellow.  Get plenty of rest.  Return to work when your temperature is back to normal or as told by your doctor. You may use a face mask and wash your hands to stop your cold from spreading. GET HELP RIGHT AWAY IF:   After the first few days, you feel you are getting worse.  You have questions about your medicine.  You have chills, shortness of breath, or brown or red spit (mucus).  You have yellow or brown snot (nasal discharge) or pain in the face, especially when you bend forward.  You have a fever, puffy (swollen) neck, pain when you swallow, or white spots in the back of your throat.  You have a bad headache, ear pain, sinus pain, or chest pain.  You have a high-pitched whistling sound when you breathe in and out (wheezing).  You have a lasting cough or cough up blood.  You have sore muscles or a stiff neck. MAKE SURE YOU:   Understand these instructions.  Will watch your condition.  Will get help right away if you are not doing well or get worse. Document Released: 06/24/2007 Document Revised: 03/30/2011 Document Reviewed: 05/12/2010 [facility] Patient Information 2013 [place], [place].

## 2012-04-06 ENCOUNTER — Telehealth: Payer: Self-pay | Admitting: Family Medicine

## 2012-04-06 MED ORDER — HYDROCODONE-HOMATROPINE 5-1.5 MG/5ML PO SYRP: 5.0000 mL | ORAL_SOLUTION | Freq: Three times a day (TID) | ORAL | Status: DC | PRN

## 2012-04-06 NOTE — Telephone Encounter (Signed)
Rx for Hycodan called to pharmacy. Work excuse note printed and placed in frontt office file. Patient notified.

## 2012-04-06 NOTE — Telephone Encounter (Signed)
Patient states that RX prescribed was OTC and has not helped any. She coughed all night. Advised will send message to dr. Durene Cal to advise.    Also she was not able to go to work today. Wants note . Is this OK ?

## 2012-04-06 NOTE — Telephone Encounter (Signed)
Pt was seen yesterday and meds that he gave her is not working - coughed all night last night - wants to know what else she can do. Is also asking for a note from work

## 2012-04-06 NOTE — Telephone Encounter (Signed)
Spoke with Myrlene Broker, RN. Ok to have work note. Have asked Larita Fife to call in [place] as well to help with cough.

## 2012-04-11 ENCOUNTER — Telehealth: Payer: Self-pay | Admitting: Family Medicine

## 2012-04-11 NOTE — Telephone Encounter (Signed)
Patient is calling to let Dr. Durene Cal know that she isn't feeling any better so she wants to know what the next step is going to be.

## 2012-04-11 NOTE — Telephone Encounter (Signed)
She should come back in for repeat evaluation.

## 2012-04-12 NOTE — Telephone Encounter (Addendum)
Patient calling back today c/o cough (unsure of color because "I have a weak stomach"), headache, sneezing, and hoarseness.  Was seen last week for URI.   Taking Mucinex-D and hydrocodone cough syrup.  Wakes up in middle of night with cough and chest discomfort due to cough.  Denies any fever or shortness of breath.   Patient informed that Dr. Durene Cal advised her to come in for repeat evaluation.  Patient states she is unable to afford another co-pay here or at urgent care.  Also, unable to take off any time from work.  Unable to page Dr. Durene Cal this afternoon due to post-call.  Will discuss with him tomorrow morning and call patient back.  Patient is ok waiting until then.  Gaylene Brooks, RN

## 2012-04-12 NOTE — Telephone Encounter (Addendum)
Pt was here last Tuesday and was told if she was not any better that he would call something in.  Cannot afford to take anymore time off work Needs to know something today - by either doctor or nurse

## 2012-04-13 NOTE — Telephone Encounter (Signed)
Spoke with patient. Symptoms essentially unchanged over 2 week period despite addition of mucinex-dm and then later hydrocodone. Still without fever/chills/nausea/vomiting. She has been able to work through this. Discussed with patient that she would need repeat evaluation given no improvement to make sure there are no changes on exam vs. Continuing to wait symptoms out. She plans for repeat eval either tomorrow or Friday afternoon. She may potentially benefit from CXR for concern of walking pneumonia though afebrile. There were no focal findings on lung exam at last visit.

## 2012-04-15 ENCOUNTER — Ambulatory Visit (INDEPENDENT_AMBULATORY_CARE_PROVIDER_SITE_OTHER): Payer: No Typology Code available for payment source | Admitting: Family Medicine

## 2012-04-15 ENCOUNTER — Ambulatory Visit (HOSPITAL_COMMUNITY)
Admission: RE | Admit: 2012-04-15 | Discharge: 2012-04-15 | Disposition: A | Discharge status: Home / Self Care | Payer: No Typology Code available for payment source | Source: Ambulatory Visit | Attending: Family Medicine | Admitting: Family Medicine

## 2012-04-15 ENCOUNTER — Encounter: Payer: Self-pay | Admitting: Family Medicine

## 2012-04-15 VITALS — BP 108/72 | HR 101 | Temp 98.6°F | Ht 60.0 in | Wt 248.0 lb

## 2012-04-15 DIAGNOSIS — Z309 Encounter for contraceptive management, unspecified: Secondary | ICD-10-CM

## 2012-04-15 DIAGNOSIS — R05 Cough: Secondary | ICD-10-CM

## 2012-04-15 DIAGNOSIS — R059 Cough, unspecified: Secondary | ICD-10-CM | POA: Insufficient documentation

## 2012-04-15 MED ORDER — MEDROXYPROGESTERONE ACETATE 150 MG/ML IM SUSP
150.0000 mg | Freq: Once | INTRAMUSCULAR | Status: AC
  Administered 2012-04-15: 150 mg via INTRAMUSCULAR

## 2012-04-15 NOTE — Patient Instructions (Signed)
I am sorry you aren't feeling better. I do not think this is a typical pneumonia or blood clot related issue.   I am concerned about walking pneumonia vs. Just a bad viral upper respiratory infection. Please go to the hospital to get a chest x-ray.  If the x-ray does not show a walking pneumonia and your symptoms still aren't better in a week, we will try prednisone for a 5 day course.  You can send me a message through [place] on Wednesday for this and you won't need an appointment unless symptoms change.   Thanks, Dr. Durene Cal

## 2012-04-15 NOTE — Assessment & Plan Note (Signed)
Thought to be URI 10 days ago and this is still most likely etiology. Advised netipot and humidifier for symptomatic relief of congestion, scratchy throat-can also try cough drops.   No swelling in legs to indicate DVT. Pulse ox 98%. Pulse 98 on my exam. Doubt PE.  Lungs CTAB-doubt typical pna. CXR to rule out atypical pneumonia.  No recent procedures to suggest pneuomothorax and no COPD. CXR as above In addition, symptoms most c/w URI as above GERD unlikely as compliant with prilosec and not typical symptoms Allergic rhinitis-on antihistamine and flonase Ace inhibitor-not likely cause of new cough given other uri symptoms Asthma-no wheeze on exam to suggest.  If CXR unrevealing, will plan on short course of prednisone 20mg  x5 days if symptoms persist for full 3 weeks (wed next week).

## 2012-04-15 NOTE — Progress Notes (Signed)
Subjective:     [name] is a 47 y.o. female who presents for evaluation of symptoms of a URI. Symptoms include cough described as nonproductive, nasal congestion, no  fever, post nasal drip and sore throat. Onset of symptoms was approximately 2.5 weeks ago now. Was evaluated initially on 04/05/12 and thought to have URI based on history/exam. Scratchy voice due to coughing also noted  Patient was given rx for  Mucinex-DM initially and had little relief so Hycodan was added a day later by phone. Symptoms have persisted and have been unchanged although slightly decreased cough with Hycodan. Also used antihistamines, flonase, ibuprofen. Patient still without fever. No nausea/vomiting. Occasional chills have resolved. Slight HA. Main bothersome symptom is cough still. No shortness of breath or chest pain. No worsened leg swelling or calf pain.   Medical history: type II DM, morbid obesity, hypertension  Review of Systems Pertinent items are noted in HPI.   Objective:    BP 108/72  Pulse 101  Temp(Src) 98.6 F (37 C) (Oral)  Ht 5' (1.524 m)  Wt 248 lb (112.492 kg)  BMI 48.43 kg/m2 General appearance: alert, cooperative and no distress, obese Head: atraumatic Eyes: negative findings: conjunctivae and sclerae normal and pupils equal, round, reactive to light and accomodation Ears: normal TM's and external ear canals both ears, slight bulging of left TM but no erythema Nose: clear discharge, turbinates swollen, inflamed Throat: slight erythema, no pus or exudates Lungs: clear to auscultation bilaterally Heart: regular rate and rhythm, S1, S2 normal, no murmur, click, rub or gallop Extremities: edema 1+ pitting, no palpable cords, legs equal, no calf tenderness Skin: Skin color, texture, turgor normal. No rashes or lesions   Assessment:  probable  viral upper respiratory illness  Plan:  See problem oriented charting

## 2012-04-19 ENCOUNTER — Telehealth: Payer: Self-pay | Admitting: Family Medicine

## 2012-04-19 NOTE — Telephone Encounter (Signed)
Pt called to let him know that she is still hoarse and the more she talks, the worse it gets.  Not sure what Dr Durene Cal wants to do now.  pls advise

## 2012-04-19 NOTE — Telephone Encounter (Signed)
Spoke with Patient and she continues to be hoarse and cough.LAst OV with Dr. Durene Cal, pt. Stated that Dr. Durene Cal may call in Prednisone if pt continued to be hoarse.  I encouraged pt. 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  I will fwd message to Dr. Durene Cal for further advise.  Pt informed.  Emilie Rutter, Darlyne Russian

## 2012-04-20 MED ORDER — PREDNISONE 20 MG PO TABS: 20.0000 mg | ORAL_TABLET | Freq: Every day | ORAL | Status: DC

## 2012-04-20 NOTE — Telephone Encounter (Signed)
Please inform patient rx available. This is for post viral cough. Agree that the MOST important thing is for her to rest her voice when able and to rest up. If no improvement in 1-2 weeks, will need repeat visit.

## 2012-04-20 NOTE — Telephone Encounter (Signed)
Pt. Notified.  .[name] L  

## 2012-06-24 ENCOUNTER — Telehealth: Payer: Self-pay | Admitting: Family Medicine

## 2012-06-24 NOTE — Telephone Encounter (Signed)
Patient is currently on depo, but feels that her body is getting used to it and is interested in making the change to Nexplanon.  She had talked to Dr. Katrinka Blazing about this previously.  Her question is, does she need to come in to discuss this with Dr. Durene Cal, or can she just make her appt with [facility] and have the procedure done?  Dennison Nancy went to check with one of the Preceptors, but she could not locate one so we decided to check with her PCP.

## 2012-06-24 NOTE — Telephone Encounter (Signed)
Pt does not need to have a separate appt to discuss changing BC.  She will just need an appt with a provider trained to perform these.  Would be best if it was Dr. Durene Cal.  Please advise patient.

## 2012-07-01 ENCOUNTER — Encounter: Payer: Self-pay | Admitting: Family Medicine

## 2012-07-01 ENCOUNTER — Ambulatory Visit (INDEPENDENT_AMBULATORY_CARE_PROVIDER_SITE_OTHER): Payer: No Typology Code available for payment source | Admitting: Family Medicine

## 2012-07-01 VITALS — BP 135/81 | HR 90 | Temp 99.3°F | Wt 260.0 lb

## 2012-07-01 DIAGNOSIS — Z30017 Encounter for initial prescription of implantable subdermal contraceptive: Secondary | ICD-10-CM | POA: Insufficient documentation

## 2012-07-01 DIAGNOSIS — Z309 Encounter for contraceptive management, unspecified: Secondary | ICD-10-CM

## 2012-07-01 DIAGNOSIS — Z3046 Encounter for surveillance of implantable subdermal contraceptive: Secondary | ICD-10-CM

## 2012-07-01 LAB — POCT URINE PREGNANCY: Preg Test, Ur: NEGATIVE

## 2012-07-01 MED ORDER — ETONOGESTREL 68 MG ~~LOC~~ IMPL
68.0000 mg | DRUG_IMPLANT | Freq: Once | SUBCUTANEOUS | Status: AC
  Administered 2012-07-01: 68 mg via SUBCUTANEOUS

## 2012-07-01 MED ORDER — ETONOGESTREL 68 MG ~~LOC~~ IMPL: 68.0000 mg | DRUG_IMPLANT | Freq: Once | SUBCUTANEOUS | Status: DC

## 2012-07-01 NOTE — Patient Instructions (Addendum)

## 2012-07-01 NOTE — Progress Notes (Addendum)
Patient ID: [name], female   DOB: Jun 20, 1965, 47 y.o.   MRN: [id number] Subjective:    [name] is a 47 y.o. female who presents for contraception counseling. The patient has no complaints today. The patient is sexually active. Pertinent past medical history: none. Patient has been on Depo for a while and will like to switch to another.  Menstrual History:  Menarche age: 72 No LMP recorded. Patient has had an injection. Spotted last month    The following portions of the patient's history were reviewed and updated as appropriate: allergies, current medications, past family history, past medical history, past social history, past surgical history and problem list.  Review of Systems Pertinent items are noted in HPI.   Objective:     Physical Exam  Nursing note and vitals reviewed. Constitutional: She appears well-developed. No distress.  Cardiovascular: Normal rate and regular rhythm.   No murmur heard. Pulmonary/Chest: Effort normal and breath sounds normal. No respiratory distress. She has no wheezes.  Musculoskeletal: She exhibits no edema.        Assessment:    47 y.o., starting Nexplanon,discontinuing Depo shot.no contraindications.  This procedure has been fully reviewed with the patient and written informed consent has been obtained.  Plan:  Procedure was carried out under sterile condition and was well tolerated with no complication.  All questions answered. Agricultural engineer distributed. Follow up in 2 weeks. Pregnancy test, result: negative.  Patient was able to palpate Nexplanon after insertion.

## 2012-07-21 ENCOUNTER — Other Ambulatory Visit: Payer: Self-pay | Admitting: Family Medicine

## 2012-07-21 ENCOUNTER — Encounter: Payer: Self-pay | Admitting: Family Medicine

## 2012-07-21 ENCOUNTER — Ambulatory Visit (INDEPENDENT_AMBULATORY_CARE_PROVIDER_SITE_OTHER): Payer: No Typology Code available for payment source | Admitting: Family Medicine

## 2012-07-21 VITALS — BP 117/83 | HR 112 | Temp 98.2°F | Wt 253.0 lb

## 2012-07-21 DIAGNOSIS — Z6841 Body Mass Index (BMI) 40.0 and over, adult: Secondary | ICD-10-CM

## 2012-07-21 DIAGNOSIS — E119 Type 2 diabetes mellitus without complications: Secondary | ICD-10-CM

## 2012-07-21 LAB — POCT GLYCOSYLATED HEMOGLOBIN (HGB A1C): Hemoglobin A1C: 7.6

## 2012-07-21 NOTE — Assessment & Plan Note (Signed)
Discussed weight loss at part of DM. Patient has image issue that she would look sickly if she lost more than 20 lbs. She is back to drinking soda and has met 0 of her goals from the last visit. We revisited these goals and patient agrees to make change. Refuses health coach, dietician, or DM education.

## 2012-07-21 NOTE — Progress Notes (Signed)
  [facility] Tana Conch, MD Phone: [phone]  Subjective:  Patient presented for wound check of nexplanon. Discussed with patient need to follow up on DM and she agrees. No issues with nexplanon to this point.   #  DIABETES Type II/Obesity Medications taking and tolerating-yes metformin and glipizide. Blood Sugars per patient-does not check.  Diet-salad with most meals, protein shake and fruit for breakfast, 4-5 servings fruits/veggies. Restarted soda mountain dew 1 a day. Goal Monday to quit.  Regular Exercise-walking 2-3x walk, 30 minutes. Squats every 1/2 hour at work continued. DId not increase to goal of 5x per week.  Image-continues to think she will be "sickly" if she lost weight.   HM up to date.  On Aspirin-yes On statin-not interested Daily foot monitoring-yes  ROS- Denies Polyuria,Polydipsia, nocturia, Vision changes, feet or hand numbness/pain/tingling. Denies  Hypoglycemia symptoms (shaky, sweaty, hungry,  anxious, tremor, palpitations, confusion, behavior change).  Lab Results  Component Value Date   HGBA1C 7.6 07/21/2012     ROS--See HPI  Past Medical History Patient Active Problem List   Diagnosis Date Noted  . Morbid obesity with BMI of 45.0-49.9, adult 03/18/2006    Priority: High  . Diabetes mellitus type II, controlled 08/24/2008    Priority: Medium  . HYPERTENSION, BENIGN SYSTEMIC 03/18/2006    Priority: Medium  . Well woman exam with routine gynecological exam 08/25/2011    Priority: Low  . Bilateral lower extremity edema 04/07/2011    Priority: Low  . Allergic rhinitis 06/19/2010    Priority: Low  . GASTROESOPHAGEAL REFLUX, NO ESOPHAGITIS 03/18/2006    Priority: Low  Reviewed problem list.  Medications- reviewed and updated Chief complaint-noted  Objective: BP 117/83  Pulse 112  Temp(Src) 98.2 F (36.8 C) (Oral)  Wt 253 lb (114.76 kg)  BMI 49.41 kg/m2 Gen: NAD, morbidly obese CV: RRR no murmurs rubs or gallops.  Upper 90s HR on my exam.  Lungs: CTAB no crackles, wheeze, rhonchi Skin: warm, dry Neuro: grossly normal, moves all extremities Ext: no edema, nexplanon site healing well with palpable rod  Assessment/Plan:

## 2012-07-21 NOTE — Assessment & Plan Note (Addendum)
DM worsening with a1c of 7.6 up from 7. Likely due to indiscretions from diet and lack of exercise. Discussed starting statin therapy due to new guidelines and patient refuses as she states she wants to take less medicine.   Discussed results of a1c with patient by phone and emphasized if she does not want more medicine, she needs to implement lifestyle changes

## 2012-07-21 NOTE — Patient Instructions (Addendum)
1. Diabetes-we will check your a1c again today. Keep taking current medications. We will call with results 2. Obesity-I would like you to meet with Dr. Gerilyn Pilgrim or our health coach. Please call me when you are ready and I will refer you to these people.   Goals for weight loss 20 lbs in 6 months.  How you will get there: 1. Increase exercise to 5 days from 3 days of walking per week.  2. Go back to not drinking soda on a daily basis.  3. Keep up the great work with eating fruit and vegetables. Perhaps increase servings by 1-2 per day.   Let's follow up on these changes in 3 months, Dr. Durene Cal

## 2012-07-26 ENCOUNTER — Telehealth: Payer: Self-pay | Admitting: Family Medicine

## 2012-07-26 DIAGNOSIS — E119 Type 2 diabetes mellitus without complications: Secondary | ICD-10-CM

## 2012-07-26 DIAGNOSIS — J309 Allergic rhinitis, unspecified: Secondary | ICD-10-CM

## 2012-07-26 DIAGNOSIS — I10 Essential (primary) hypertension: Secondary | ICD-10-CM

## 2012-07-26 DIAGNOSIS — K219 Gastro-esophageal reflux disease without esophagitis: Secondary | ICD-10-CM

## 2012-07-26 MED ORDER — GLIPIZIDE ER 5 MG PO TB24: 5.0000 mg | ORAL_TABLET | Freq: Every day | ORAL | Status: DC

## 2012-07-26 MED ORDER — OMEPRAZOLE 20 MG PO CPDR: 20.0000 mg | DELAYED_RELEASE_CAPSULE | Freq: Every day | ORAL | Status: DC

## 2012-07-26 MED ORDER — CETIRIZINE HCL 10 MG PO TABS: 10.0000 mg | ORAL_TABLET | Freq: Every day | ORAL | Status: DC

## 2012-07-26 MED ORDER — LISINOPRIL-HYDROCHLOROTHIAZIDE 20-25 MG PO TABS: 1.0000 | ORAL_TABLET | Freq: Every day | ORAL | Status: DC

## 2012-07-26 MED ORDER — METFORMIN HCL 1000 MG PO TABS: 1000.0000 mg | ORAL_TABLET | Freq: Two times a day (BID) | ORAL | Status: DC

## 2012-07-26 MED ORDER — FLUTICASONE PROPIONATE 50 MCG/ACT NA SUSP: 2.0000 | Freq: Every day | NASAL | Status: DC

## 2012-07-26 NOTE — Telephone Encounter (Signed)
Refilled and sent to [facility] on [address]. Please inform patient.

## 2012-07-26 NOTE — Telephone Encounter (Signed)
Will forward to pcp. Elisha Cooksey,  Gwenneth Whiteman   

## 2012-07-26 NOTE — Telephone Encounter (Signed)
Pt is aware that her bp medication was sent into pharmacy.  She states that her insurance no longer pays for medication at [facility] and will need all of [facility] so [facility] can pick them up when needed.  HARTSELL,  JAZMIN

## 2012-07-26 NOTE — Telephone Encounter (Signed)
Pt is requesting a refill on Lisinopril be sent to her new pharmacy [facility] [place]. Pt is also requesting that they do a 90 day supply so that her insurance will cover this. In the future she wants all medication to be [facility]. JW

## 2012-07-26 NOTE — Telephone Encounter (Signed)
Meds refilled-all meds.

## 2012-08-24 ENCOUNTER — Encounter (HOSPITAL_COMMUNITY): Payer: Self-pay | Admitting: *Deleted

## 2012-08-24 ENCOUNTER — Emergency Department (INDEPENDENT_AMBULATORY_CARE_PROVIDER_SITE_OTHER)
Admission: EM | Admit: 2012-08-24 | Discharge: 2012-08-24 | Disposition: A | Discharge status: Home / Self Care | Payer: No Typology Code available for payment source | Source: Home / Self Care | Attending: Emergency Medicine | Admitting: Emergency Medicine

## 2012-08-24 ENCOUNTER — Telehealth: Payer: Self-pay | Admitting: Family Medicine

## 2012-08-24 DIAGNOSIS — H109 Unspecified conjunctivitis: Secondary | ICD-10-CM

## 2012-08-24 MED ORDER — TOBRAMYCIN 0.3 % OP SOLN: 1.0000 [drp] | Freq: Four times a day (QID) | OPHTHALMIC | Status: AC

## 2012-08-24 NOTE — ED Provider Notes (Signed)
CSN: [id number]     Arrival date & time 08/24/12  1543 History     First MD Initiated Contact with Patient 08/24/12 1736     Chief Complaint  Patient presents with  . Conjunctivitis   (Consider location/radiation/quality/duration/timing/severity/associated sxs/prior Treatment) HPI Comments: Patient presents urgent care this evening complaining of right ear redness minimal soreness in crusted drainage started early this morning. Prior to this redness and minimal discomfort she was feeling some itchiness to her right eye. She denies any true pain, photophobia or recent injuries or foramen body sensation. Patient also denies any changes in her vision or any other associated symptoms such as headaches, dizziness visual changes or numbness or facial weakness  Patient is a 47 y.o. female presenting with conjunctivitis. The history is provided by the patient.  Conjunctivitis This is a new problem. The problem occurs constantly. The problem has not changed since onset.Pertinent negatives include no chest pain and no headaches. Exacerbated by: blinking and touching my eye. Nothing relieves the symptoms. She has tried nothing for the symptoms.    Past Medical History  Diagnosis Date  . DIAB W/UNSPEC COMP TYPE II/UNSPEC TYPE UNCNTRL 08/24/2008  . OBESITY, NOS 03/18/2006  . HYPERTENSION, BENIGN SYSTEMIC 03/18/2006  . GASTROESOPHAGEAL REFLUX, NO ESOPHAGITIS 03/18/2006   Past Surgical History  Procedure Laterality Date  . Cholecystectomy    . Tubal ligation  2004   Family History  Problem Relation Age of Onset  . Hypertension Mother   . Diabetes Mother   . Hypertension Father    History  Substance Use Topics  . Smoking status: Never Smoker   . Smokeless tobacco: Not on file  . Alcohol Use: No   OB History   Grav Para Term Preterm Abortions TAB SAB Ect Mult Living                 Review of Systems  Constitutional: Negative for fever.  Eyes: Positive for discharge, redness and itching.  Negative for photophobia, pain and visual disturbance.  Cardiovascular: Negative for chest pain.  Skin: Negative for rash.  Neurological: Negative for dizziness, weakness, numbness and headaches.    Allergies  Review of patient's allergies indicates no known allergies.  Home Medications   Current Outpatient Rx  Name  Route  Sig  Dispense  Refill  . Blood Glucose Monitoring Suppl (ONE TOUCH BASIC SYSTEM) W/DEVICE KIT      Check blood sugars two times a day          . cetirizine (ZYRTEC) 10 MG tablet   Oral   Take 1 tablet (10 mg total) by mouth daily.   90 tablet   3   . glipiZIDE (GLUCOTROL XL) 5 MG 24 hr tablet   Oral   Take 1 tablet (5 mg total) by mouth daily.   90 tablet   3   . lisinopril-hydrochlorothiazide (PRINZIDE,ZESTORETIC) 20-25 MG per tablet   Oral   Take 1 tablet by mouth daily.   90 tablet   3   . metFORMIN (GLUCOPHAGE) 1000 MG tablet   Oral   Take 1 tablet (1,000 mg total) by mouth 2 (two) times daily with a meal.   180 tablet   3   . EXPIRED: aspirin EC 81 MG tablet   Oral   Take 81 mg by mouth daily.         . Calcium Carbonate-Vitamin D (CALCIUM 600+D) 600-400 MG-UNIT per tablet   Oral   Take 1 tablet by mouth 2 (two)  times daily.           . fluticasone (FLONASE) 50 MCG/ACT nasal spray   Nasal   Place 2 sprays into the nose daily.   32 g   5   . Lancets MISC      Check blood sugars two times a day          . omeprazole (PRILOSEC) 20 MG capsule   Oral   Take 1 capsule (20 mg total) by mouth daily.   90 capsule   3   . tobramycin (TOBREX) 0.3 % ophthalmic solution   Right Eye   Place 1 drop into the right eye every 6 (six) hours.   5 mL   0    BP 117/69  Pulse 97  Temp(Src) 98.3 F (36.8 C) (Oral)  Resp 16  SpO2 100% Physical Exam  Nursing note and vitals reviewed. Constitutional: She appears well-developed and well-nourished.  HENT:  Head: Normocephalic.  Mouth/Throat: No oropharyngeal exudate.  Eyes: EOM  are normal. Pupils are equal, round, and reactive to light. Right eye exhibits discharge. Left eye exhibits no discharge, no exudate and no hordeolum. No foreign body present in the left eye. Right conjunctiva is injected. Left conjunctiva has no hemorrhage. No scleral icterus.    Neck: Neck supple.  Neurological: She is alert.  Skin: No rash noted. No erythema.    ED Course   Procedures (including critical care time)  Labs Reviewed - No data to display No results found. 1. Conjunctivitis     MDM  Plan of care diagnostic impression  Focal bacterial conjunctivitis, Patient to start with ophthalmic antibiotic eyedrops Directed to return if no improvement in 48-72 hours To call us if worsening symptoms or new symptoms.  New Prescriptions   TOBRAMYCIN (TOBREX) 0.3 % OPHTHALMIC SOLUTION    Place 1 drop into the right eye every 6 (six) hours.    Jimmie Molly, MD 08/24/12 [phone]

## 2012-08-24 NOTE — ED Notes (Signed)
C/o redness and tightness in OD onset yesterday.  No pain.  Crusty drainage on her eye this morning that she washed off.   It itches off an on.  No photophobia

## 2012-08-24 NOTE — Telephone Encounter (Signed)
Patient calls stating her right eye is red and irritated and she does not know if it is a ruptured blood vessel or pink eye? She would like to see a MD today sometime after 3:30 pm. Nothing to schedule as of now. Pls call patient.

## 2012-08-24 NOTE — Telephone Encounter (Signed)
Returned call to pt - reports that eye is red and sometimes draining "yellow stuff" - no appointments available, recommended warm compresses and further eval at urgent care due to lack of appointments until Friday. Pt verbalized understanding. Wyatt Haste, RN-BSN

## 2012-11-10 ENCOUNTER — Other Ambulatory Visit: Payer: Self-pay | Admitting: Family Medicine

## 2012-11-10 DIAGNOSIS — I10 Essential (primary) hypertension: Secondary | ICD-10-CM

## 2012-11-10 MED ORDER — LISINOPRIL-HYDROCHLOROTHIAZIDE 20-25 MG PO TABS: 1.0000 | ORAL_TABLET | Freq: Every day | ORAL | Status: DC

## 2013-01-06 ENCOUNTER — Encounter: Payer: Self-pay | Admitting: Family Medicine

## 2013-01-06 ENCOUNTER — Ambulatory Visit (INDEPENDENT_AMBULATORY_CARE_PROVIDER_SITE_OTHER): Payer: PRIVATE HEALTH INSURANCE | Admitting: Family Medicine

## 2013-01-06 VITALS — BP 140/93 | HR 97 | Temp 98.5°F | Wt 254.6 lb

## 2013-01-06 DIAGNOSIS — J309 Allergic rhinitis, unspecified: Secondary | ICD-10-CM

## 2013-01-06 DIAGNOSIS — I10 Essential (primary) hypertension: Secondary | ICD-10-CM

## 2013-01-06 DIAGNOSIS — E119 Type 2 diabetes mellitus without complications: Secondary | ICD-10-CM

## 2013-01-06 MED ORDER — CETIRIZINE HCL 10 MG PO TABS: 10.0000 mg | ORAL_TABLET | Freq: Every day | ORAL | Status: AC

## 2013-01-06 MED ORDER — GUAIFENESIN-CODEINE 100-10 MG/5ML PO SOLN: 5.0000 mL | Freq: Four times a day (QID) | ORAL | Status: DC | PRN

## 2013-01-06 NOTE — Patient Instructions (Signed)
Thanks for coming in today. As you know, I don't like this diagnosis because I dont have a quick fix for it. For symptom control, take the codeine cough syrup (dont take mucinex or robitussen with it). Try to avoid sudafed due to your blood pressures. I want you to see me back if you are not improving in about 10 days or if you get a fevers, shortness of breath, chest pain.   Let's see each other in early January to discuss your diabetes. I'll give you a call if a1c above 8, otherwise we will talk about it at next visit.   Thanks, Dr. Durene Cal  Health Maintenance Due  Topic Date Due  . Urine Microalbumin -test today 08/23/2012  . Ophthalmology Exam - try to get records today 12/10/2012

## 2013-01-06 NOTE — Progress Notes (Signed)
  Tana Conch, MD Phone: [phone]  Subjective:     [name] is a 47 y.o. female who presents for evaluation of symptoms of a URI. Symptoms include cough described as productive clear sputum, nasal congestion, no  fever, post nasal drip and sore throat. Onset of symptoms was 2 weeks ago, and has been gradually improving since that time. Main persistent symptom is a cough. Treatment to date: sudafed, robitussin, mucinex (all helped some). Ran out of zyrtec but switched to allegra.   Past medical history Patient Active Problem List   Diagnosis Date Noted  . Morbid obesity with BMI of 45.0-49.9, adult 03/18/2006    Priority: High  . Diabetes mellitus type II, controlled 08/24/2008    Priority: Medium  . HYPERTENSION, BENIGN SYSTEMIC 03/18/2006    Priority: Medium  . Bilateral lower extremity edema 04/07/2011    Priority: Low  . Allergic rhinitis 06/19/2010    Priority: Low  . GASTROESOPHAGEAL REFLUX, NO ESOPHAGITIS 03/18/2006    Priority: Low   Review of Systems No chest pain. Mild shortness of breath but only with long coughing spells. Did have post tussive emesis occasionally (nonbilous and nonbloody). Several sick contacts through postal service.   Objective:    BP 140/93  Pulse 97  Temp(Src) 98.5 F (36.9 C) (Oral)  Wt 254 lb 9.6 oz (115.486 kg) General appearance: alert, cooperative and fatigued Eyes: conjunctivae/corneas clear. PERRL, EOM's intact.  Ears: normal TM's and external ear canals both ears Nose: Nares normal. Septum midline. Mucosa normal. No drainage or sinus tenderness., clear discharge, turbinates red, swollen Throat: cobblestoning of throat Lungs: clear to auscultation bilaterally Heart: regular rate and rhythm, S1, S2 normal, no murmur, click, rub or gallop Skin: Skin color, texture, turgor normal. No rashes or lesions   Assessment:    viral upper respiratory illness   Plan:    Discussed diagnosis and treatment of URI. Suggested  symptomatic OTC remedies. Nasal saline spray for congestion. Follow up as needed. Follow up in 10 days or as needed. Codeine cough syrup given as was beneficial for similar occurence a year ago. ultimately at that time used a prednisone course for post viral cough which cleared cough (would consider after >3 weeks of illness)   Orders Placed This Encounter  Procedures  . HgB A1c    Meds ordered this encounter  Medications  . cetirizine (ZYRTEC) 10 MG tablet    Sig: Take 1 tablet (10 mg total) by mouth daily.    Dispense:  30 tablet    Refill:  11  . guaiFENesin-codeine 100-10 MG/5ML syrup    Sig: Take 5 mLs by mouth every 6 (six) hours as needed for cough.    Dispense:  240 mL    Refill:  0

## 2013-01-06 NOTE — Assessment & Plan Note (Signed)
Previously well controlled. Likely elevated due to sudafed, discouraged use.

## 2013-04-27 ENCOUNTER — Ambulatory Visit: Payer: Self-pay | Admitting: Podiatry

## 2013-05-15 ENCOUNTER — Other Ambulatory Visit: Payer: Self-pay | Admitting: Family Medicine

## 2013-06-30 ENCOUNTER — Encounter: Payer: Self-pay | Admitting: Family Medicine

## 2013-06-30 ENCOUNTER — Ambulatory Visit (INDEPENDENT_AMBULATORY_CARE_PROVIDER_SITE_OTHER): Payer: PRIVATE HEALTH INSURANCE | Admitting: Family Medicine

## 2013-06-30 VITALS — BP 118/81 | HR 102 | Temp 98.1°F | Ht 60.0 in | Wt 253.0 lb

## 2013-06-30 DIAGNOSIS — R05 Cough: Secondary | ICD-10-CM

## 2013-06-30 DIAGNOSIS — R059 Cough, unspecified: Secondary | ICD-10-CM

## 2013-06-30 DIAGNOSIS — J309 Allergic rhinitis, unspecified: Secondary | ICD-10-CM | POA: Insufficient documentation

## 2013-06-30 MED ORDER — PREDNISONE 20 MG PO TABS: 20.0000 mg | ORAL_TABLET | Freq: Every day | ORAL | Status: DC

## 2013-06-30 NOTE — Assessment & Plan Note (Signed)
Symptoms and exam c/w allergic rhinitis and allergic cough. Nasal saline prior to using Flonase one time daily. To pick one antihistamine (Cetirizine) and discard others (Allegra, benadryl) and codeine syrup.  Short course of prednisone to help clear up sxs, for follow up next week. Discussed possible side effects of prednisone; to be on alert for possible triggers of her flare.

## 2013-06-30 NOTE — Progress Notes (Signed)
   Subjective:    Patient ID: [name], female    DOB: Oct 02, 1965, 48 y.o.   MRN: [id number]  HPI Patient here for SDA for nasal sxs since June 7th, 2015, runny nose and cough that is dry. Started when she went to [facility] for family reunion this weekend; started with clear rhinorrhea afterward and presumed it was seasonal allergies. She does not usually suffer from seasonal allergies.  Started taking Zyrtec and using flonase twice daily; added Benadryl, Allegra, Alka-Seltzer PLUS and cough syrup with codeine that she had around the house.  Continues with cough and rhinorrhea, and hoarseness.   Adds that she has a "flickery" feeling in her left ear, not pain but more of an intermittent pressure-like feeling. No tinnitus, no dizziness, no decreased hearing. No otorrhea.   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  No furry or feathered pets at home, nor where she visited over the weekend. No sick contacts. Does not feel 'ill'.    Review of Systems No fevers or chills, no dyspnea. No chest pain.      Objective:   Physical Exam Well appearing, no apparent distress HEENT neck supple. Injected conjunctivae bilaterally, PERRL. TMs clear with good cones of light. Clear nasal mucosa with pallor; clear rhinorrhea. Neck without cervical adenopathy. Clear oropharynx with some cobblestoning.  COR Regular S1S2, no extra sounds PULM Clear bilaterally no rales or wheezes.         Assessment & Plan:

## 2013-06-30 NOTE — Patient Instructions (Signed)
It was a pleasure to meet you today.  I believe your symptoms are allergic in origin.   Please limit your anti-allergy medicines to Zyrtec 10mg  one time daily, Flonase nasal spray 2 sprays per nostril one time in the morning. Use nasal saline to rinse your nose before each application of Flonase.   I am prescribing Prednisone 20mg  tablets, take 1 tablet by mouth one time daily with something in your stomach, for 5 days.    FOLLOW UP IN THE COMING WEEK FOR COUGH/ALLERGY SYMPTOMS.

## 2013-07-07 ENCOUNTER — Ambulatory Visit (INDEPENDENT_AMBULATORY_CARE_PROVIDER_SITE_OTHER): Payer: PRIVATE HEALTH INSURANCE | Admitting: Family Medicine

## 2013-07-07 ENCOUNTER — Encounter: Payer: Self-pay | Admitting: Family Medicine

## 2013-07-07 VITALS — BP 130/84 | HR 98 | Temp 98.1°F | Ht 60.0 in | Wt 250.0 lb

## 2013-07-07 DIAGNOSIS — J309 Allergic rhinitis, unspecified: Secondary | ICD-10-CM

## 2013-07-07 DIAGNOSIS — J0100 Acute maxillary sinusitis, unspecified: Secondary | ICD-10-CM

## 2013-07-07 DIAGNOSIS — I10 Essential (primary) hypertension: Secondary | ICD-10-CM

## 2013-07-07 DIAGNOSIS — J329 Chronic sinusitis, unspecified: Secondary | ICD-10-CM | POA: Insufficient documentation

## 2013-07-07 DIAGNOSIS — E119 Type 2 diabetes mellitus without complications: Secondary | ICD-10-CM

## 2013-07-07 LAB — POCT GLYCOSYLATED HEMOGLOBIN (HGB A1C): Hemoglobin A1C: 7.3

## 2013-07-07 MED ORDER — AMOXICILLIN-POT CLAVULANATE 875-125 MG PO TABS: 1.0000 | ORAL_TABLET | Freq: Two times a day (BID) | ORAL | Status: DC

## 2013-07-07 MED ORDER — FLUCONAZOLE 150 MG PO TABS
150.0000 mg | ORAL_TABLET | Freq: Once | ORAL | Status: DC
Start: 2013-07-07 — End: 2014-05-04

## 2013-07-07 MED ORDER — GUAIFENESIN-CODEINE 100-10 MG/5ML PO SOLN: 5.0000 mL | Freq: Three times a day (TID) | ORAL | Status: DC | PRN

## 2013-07-07 MED ORDER — IPRATROPIUM BROMIDE 0.06 % NA SOLN: 2.0000 | Freq: Four times a day (QID) | NASAL | Status: DC

## 2013-07-07 NOTE — Assessment & Plan Note (Signed)
Continue treatments initiated by Dr. Lindell Noe.  Increase nasal saline to 4+ times per day Start nasal atrovent

## 2013-07-07 NOTE — Progress Notes (Signed)
[name] is a 48 y.o. female who presents to [facility] today for SD appt for ear pain:   Ear pain: Seen 7 days ago after visit w/ Dr Lindell Noe for similar symptoms. Started on nasal saline, flonase and zyrtec, and prednisone course. Some improvement overall. Continues to have significant drainage adn cough. Robitussin AC w/ some benefit. Now associated w/ HA and sinus pressure. Ongoing now for 10 days. Getting worse. Denies fevers, n/v/rash.    The following portions of the patient's history were reviewed and updated as appropriate: allergies, current medications, past medical history, family and social history, and problem list.  Patient is a nonsmoker.  Past Medical History  Diagnosis Date  . DIAB W/UNSPEC COMP TYPE II/UNSPEC TYPE UNCNTRL 08/24/2008  . OBESITY, NOS 03/18/2006  . HYPERTENSION, BENIGN SYSTEMIC 03/18/2006  . GASTROESOPHAGEAL REFLUX, NO ESOPHAGITIS 03/18/2006    ROS as above otherwise neg.    Medications reviewed. Current Outpatient Prescriptions  Medication Sig Dispense Refill  . aspirin EC 81 MG tablet Take 81 mg by mouth daily.      . Blood Glucose Monitoring Suppl (ONE TOUCH BASIC SYSTEM) W/DEVICE KIT Check blood sugars two times a day       . Calcium Carbonate-Vitamin D (CALCIUM 600+D) 600-400 MG-UNIT per tablet Take 1 tablet by mouth 2 (two) times daily.        . cetirizine (ZYRTEC) 10 MG tablet Take 1 tablet (10 mg total) by mouth daily.  30 tablet  11  . fluticasone (FLONASE) 50 MCG/ACT nasal spray Place 2 sprays into the nose daily.  32 g  5  . glipiZIDE (GLUCOTROL XL) 5 MG 24 hr tablet Take 1 tablet (5 mg total) by mouth daily.  90 tablet  3  . guaiFENesin-codeine 100-10 MG/5ML syrup Take 5 mLs by mouth every 6 (six) hours as needed for cough.  240 mL  0  . Lancets MISC Check blood sugars two times a day       . lisinopril-hydrochlorothiazide (PRINZIDE,ZESTORETIC) 20-25 MG per tablet TAKE 1 TABLET BY MOUTH DAILY.  90 tablet  1  . metFORMIN (GLUCOPHAGE) 1000 MG tablet  Take 1 tablet (1,000 mg total) by mouth 2 (two) times daily with a meal.  180 tablet  3  . omeprazole (PRILOSEC) 20 MG capsule Take 1 capsule (20 mg total) by mouth daily.  90 capsule  3  . predniSONE (DELTASONE) 20 MG tablet Take 1 tablet (20 mg total) by mouth daily with breakfast.  5 tablet  0   No current facility-administered medications for this visit.    Exam:  BP 130/84  Pulse 98  Temp(Src) 98.1 F (36.7 C) (Oral)  Ht 5' (1.524 m)  Wt 250 lb (113.399 kg)  BMI 48.82 kg/m2  SpO2 100% Gen: Well NAD HEENT: EOMI,  MMM, nasally speech, maxillary sinuses ttp Lungs: CTABL Nl WOB Heart: RRR no MRG Abd: NABS, NT, ND Exts: Non edematous BL  LE, warm and well perfused.   Results for orders placed in visit on 07/07/13 (from the past 72 hour(s))  POCT GLYCOSYLATED HEMOGLOBIN (HGB A1C)     Status: None   Collection Time    07/07/13  8:46 AM      Result Value Ref Range   Hemoglobin A1C 7.3      A/P (as seen in Problem list)  HYPERTENSION, BENIGN SYSTEMIC At goal. No change  Allergic rhinitis Continue treatments initiated by Dr. Lindell Noe.  Increase nasal saline to 4+ times per day Start nasal atrovent  Sinusitis Symptoms for greater than 10 days w/ new onset maxillary ttp Augmentin

## 2013-07-07 NOTE — Assessment & Plan Note (Signed)
Symptoms for greater than 10 days w/ new onset maxillary ttp Augmentin

## 2013-07-07 NOTE — Addendum Note (Signed)
Addended by: Waldemar Dickens on: 07/07/2013 09:19 AM   Modules accepted: Orders

## 2013-07-07 NOTE — Patient Instructions (Signed)
You are likely developing a sinus infection Please start the augmentin Start th;e diflucan if you develop a yeast infection Please also start the atrovent to dry up your nasal secretions.  Increase your nasal saline flushes o 4+ times per day

## 2013-07-07 NOTE — Assessment & Plan Note (Signed)
At goal.  No change 

## 2013-09-07 ENCOUNTER — Other Ambulatory Visit: Payer: Self-pay | Admitting: *Deleted

## 2013-09-07 DIAGNOSIS — E119 Type 2 diabetes mellitus without complications: Secondary | ICD-10-CM

## 2013-09-07 MED ORDER — METFORMIN HCL 1000 MG PO TABS: 1000.0000 mg | ORAL_TABLET | Freq: Two times a day (BID) | ORAL | Status: DC

## 2013-09-07 MED ORDER — GLIPIZIDE ER 5 MG PO TB24: 5.0000 mg | ORAL_TABLET | Freq: Every day | ORAL | Status: DC

## 2013-09-08 ENCOUNTER — Other Ambulatory Visit: Payer: Self-pay | Admitting: *Deleted

## 2013-09-08 DIAGNOSIS — E119 Type 2 diabetes mellitus without complications: Secondary | ICD-10-CM

## 2013-09-10 MED ORDER — GLIPIZIDE ER 5 MG PO TB24: 5.0000 mg | ORAL_TABLET | Freq: Every day | ORAL | Status: DC

## 2013-09-10 MED ORDER — METFORMIN HCL 1000 MG PO TABS: 1000.0000 mg | ORAL_TABLET | Freq: Two times a day (BID) | ORAL | Status: DC

## 2013-12-22 ENCOUNTER — Other Ambulatory Visit: Payer: Self-pay | Admitting: *Deleted

## 2013-12-25 MED ORDER — LISINOPRIL-HYDROCHLOROTHIAZIDE 20-25 MG PO TABS: 1.0000 | ORAL_TABLET | Freq: Every day | ORAL | Status: DC

## 2014-05-04 ENCOUNTER — Ambulatory Visit (INDEPENDENT_AMBULATORY_CARE_PROVIDER_SITE_OTHER): Payer: Federal, State, Local not specified - PPO | Admitting: Internal Medicine

## 2014-05-04 ENCOUNTER — Encounter: Payer: Self-pay | Admitting: Internal Medicine

## 2014-05-04 ENCOUNTER — Other Ambulatory Visit (INDEPENDENT_AMBULATORY_CARE_PROVIDER_SITE_OTHER): Payer: Federal, State, Local not specified - PPO

## 2014-05-04 VITALS — BP 112/72 | HR 68 | Temp 98.7°F | Resp 16 | Ht 60.0 in | Wt 248.9 lb

## 2014-05-04 DIAGNOSIS — E119 Type 2 diabetes mellitus without complications: Secondary | ICD-10-CM

## 2014-05-04 DIAGNOSIS — J309 Allergic rhinitis, unspecified: Secondary | ICD-10-CM

## 2014-05-04 DIAGNOSIS — I10 Essential (primary) hypertension: Secondary | ICD-10-CM

## 2014-05-04 DIAGNOSIS — Z6841 Body Mass Index (BMI) 40.0 and over, adult: Secondary | ICD-10-CM

## 2014-05-04 LAB — LIPID PANEL
Cholesterol: 143 mg/dL (ref 0–200)
HDL: 39.4 mg/dL (ref >39.00)
LDL Cholesterol: 84 mg/dL (ref 0–99)
NonHDL: 103.6
Total CHOL/HDL Ratio: 4
Triglycerides: 97 mg/dL (ref 0.0–149.0)
VLDL: 19.4 mg/dL (ref 0.0–40.0)

## 2014-05-04 LAB — BASIC METABOLIC PANEL
BUN: 13 mg/dL (ref 6–23)
CALCIUM: 9.5 mg/dL (ref 8.4–10.5)
CHLORIDE: 101 meq/L (ref 96–112)
CO2: 29 meq/L (ref 19–32)
CREATININE: 0.95 mg/dL (ref 0.40–1.20)
GFR: 81.34 mL/min (ref >60.00)
Glucose, Bld: 103 mg/dL — ABNORMAL HIGH (ref 70–99)
Potassium: 3.9 mEq/L (ref 3.5–5.1)
SODIUM: 135 meq/L (ref 135–145)

## 2014-05-04 LAB — HEMOGLOBIN A1C: HEMOGLOBIN A1C: 7.3 % — AB (ref 4.6–6.5)

## 2014-05-04 LAB — TSH: TSH: 2.4 u[IU]/mL (ref 0.35–4.50)

## 2014-05-04 MED ORDER — SITAGLIPTIN PHOSPHATE 100 MG PO TABS: 100.0000 mg | ORAL_TABLET | Freq: Every day | ORAL | Status: DC

## 2014-05-04 NOTE — Progress Notes (Signed)
   Subjective:    Patient ID: [name], female    DOB: 05-18-1965, 49 y.o.   MRN: [id number]  HPI The patient is a new 49 YO female who is coming in with concerns about her diabetes. She thinks that she is gaining some weight with the glipizide she is taking. She has been actively working on losing weight and will go down and then up and is not changing anything. She is not regularly checking her sugars but when she does they are usually 100-120 (in the morning). Denies hypoglycemia or increased thirst or urination. She would love to lose more weight and then get off some medications. Thinks her diet is okay but could be better.   PMH, [facility], social history reviewed and updated.   Review of Systems  Constitutional: Positive for unexpected weight change. Negative for activity change, appetite change and fatigue.  Eyes: Negative.   Respiratory: Negative.   Cardiovascular: Negative.   Gastrointestinal: Negative.   Musculoskeletal: Negative.   Skin: Negative.   Neurological: Negative.   Psychiatric/Behavioral: Negative.       Objective:   Physical Exam  Constitutional: She is oriented to person, place, and time. She appears well-developed and well-nourished.  Overweight  HENT:  Head: Normocephalic and atraumatic.  Eyes: EOM are normal.  Neck: Normal range of motion. No thyromegaly present.  Cardiovascular: Normal rate and regular rhythm.   Pulmonary/Chest: Effort normal and breath sounds normal. No respiratory distress. She has no wheezes.  Abdominal: Soft. She exhibits no distension. There is no tenderness.  Musculoskeletal: She exhibits no edema.  Neurological: She is alert and oriented to person, place, and time. Coordination normal.  Skin: Skin is warm and dry.  See foot exam   Filed Vitals:   05/04/14 1059  BP: 112/72  Pulse: 68  Temp: 98.7 F (37.1 C)  TempSrc: Oral  Resp: 16  Height: 5' (1.524 m)  Weight: 248 lb 14.4 oz (112.9 kg)  SpO2: 94%        Assessment & Plan:

## 2014-05-04 NOTE — Assessment & Plan Note (Addendum)
This is a severe problem and has complications of diabetes and hypertension. Optomizing her medications for weight loss and talked to her about diet and exercise. She would like to change and is highly motivated to decrease her weight. Talked to her about nutrition counseling and she would like to wait and try on her own first.

## 2014-05-04 NOTE — Patient Instructions (Signed)
We will check the blood work and call you back with the results.   Stop taking the glipizide. We have sent in a medicine called [place] which is a pill for diabetes that also helps with weight loss.   We will check on the thyroid today too.   Work on increasing the exercise and think about talking to a nutritionist to make sure the diet is going well.

## 2014-05-04 NOTE — Assessment & Plan Note (Signed)
Stop glipizide, continue metformin and add [place]. Check HgA1c today and foot exam done. Reminded her about eye exam. She is on ACE-I. Also checking lipid panel as well.

## 2014-05-04 NOTE — Progress Notes (Signed)
Pre visit review using our clinic review tool, if applicable. No additional management support is needed unless otherwise documented below in the visit note. 

## 2014-05-06 NOTE — Assessment & Plan Note (Signed)
BP at goal today, checking labs. Continue lisinopril/hctz. No side effects noted.

## 2014-05-06 NOTE — Assessment & Plan Note (Signed)
She does have fairly severe allergies and use zyrtec as well as flonase during her peak season. No signs of sinus infection or need for antibiotics today.

## 2014-05-12 ENCOUNTER — Other Ambulatory Visit: Payer: Self-pay | Admitting: Family Medicine

## 2014-05-29 ENCOUNTER — Other Ambulatory Visit: Payer: Self-pay | Admitting: Family Medicine

## 2014-05-31 ENCOUNTER — Other Ambulatory Visit: Payer: Self-pay | Admitting: Internal Medicine

## 2014-06-26 ENCOUNTER — Telehealth: Payer: Self-pay | Admitting: Internal Medicine

## 2014-06-26 DIAGNOSIS — J302 Other seasonal allergic rhinitis: Secondary | ICD-10-CM

## 2014-06-26 MED ORDER — FLUTICASONE PROPIONATE 50 MCG/ACT NA SUSP: 2.0000 | Freq: Every day | NASAL | Status: DC

## 2014-06-26 MED ORDER — LISINOPRIL-HYDROCHLOROTHIAZIDE 20-25 MG PO TABS: 1.0000 | ORAL_TABLET | Freq: Every day | ORAL | Status: DC

## 2014-06-26 NOTE — Telephone Encounter (Signed)
Yes it is fine

## 2014-06-26 NOTE — Telephone Encounter (Signed)
Patient is requesting a refill for the following scripts to be sent to [facility] on [address]  lisinopril-hydrochlorothiazide (PRINZIDE,ZESTORETIC) 20-25 MG per tablet [[id number]]  fluticasone (FLONASE) 50 MCG/ACT nasal spray [[id number]] ENDED    triamcinolone cream (KENALOG) 0.1 % [[id number]] DISCONTINUED

## 2014-06-27 MED ORDER — TRIAMCINOLONE ACETONIDE 0.5 % EX OINT: TOPICAL_OINTMENT | Freq: Two times a day (BID) | CUTANEOUS | Status: DC

## 2014-09-26 ENCOUNTER — Ambulatory Visit (INDEPENDENT_AMBULATORY_CARE_PROVIDER_SITE_OTHER): Payer: Federal, State, Local not specified - PPO | Admitting: Internal Medicine

## 2014-09-26 ENCOUNTER — Encounter: Payer: Self-pay | Admitting: Internal Medicine

## 2014-09-26 VITALS — BP 104/70 | HR 94 | Temp 98.2°F | Ht 60.0 in | Wt 251.0 lb

## 2014-09-26 DIAGNOSIS — E119 Type 2 diabetes mellitus without complications: Secondary | ICD-10-CM

## 2014-09-26 DIAGNOSIS — I10 Essential (primary) hypertension: Secondary | ICD-10-CM

## 2014-09-26 DIAGNOSIS — E1169 Type 2 diabetes mellitus with other specified complication: Secondary | ICD-10-CM | POA: Insufficient documentation

## 2014-09-26 DIAGNOSIS — E785 Hyperlipidemia, unspecified: Secondary | ICD-10-CM

## 2014-09-26 DIAGNOSIS — J309 Allergic rhinitis, unspecified: Secondary | ICD-10-CM | POA: Diagnosis not present

## 2014-09-26 MED ORDER — METHYLPREDNISOLONE ACETATE 80 MG/ML IJ SUSP
80.0000 mg | Freq: Once | INTRAMUSCULAR | Status: AC
  Administered 2014-09-26: 80 mg via INTRAMUSCULAR

## 2014-09-26 MED ORDER — PREDNISONE 10 MG PO TABS: ORAL_TABLET | ORAL | Status: DC

## 2014-09-26 MED ORDER — MONTELUKAST SODIUM 10 MG PO TABS: 10.0000 mg | ORAL_TABLET | Freq: Every day | ORAL | Status: DC

## 2014-09-26 NOTE — Patient Instructions (Addendum)
You had the steroid shot today  Please take all new medication as prescribed  - the singulair (generic), and the prednisone  Please continue all other medications as before, including the zyrtec and flonase  Please have the pharmacy call with any other refills you may need.  Please keep your appointments with your specialists as you may have planned  Please make your regular follow up office visit with Dr Doug Sou for sometime next month

## 2014-09-26 NOTE — Progress Notes (Signed)
Pre visit review using our clinic review tool, if applicable. No additional management support is needed unless otherwise documented below in the visit note. 

## 2014-09-26 NOTE — Progress Notes (Signed)
Subjective:    Patient ID: [name], female    DOB: 01-08-1966, 49 y.o.   MRN: [id number]  HPI  Here to f/u - Does have several wks ongoing nasal allergy symptoms with clearish congestion, itch and sneezing, without fever, pain, ST, cough, swelling or wheezing, all despite good compliance with avoidance measures, and flonase/zyrtec.  Pt denies fever, wt loss, night sweats, loss of appetite, or other constitutional symptoms  Pt denies chest pain, increased sob or doe, wheezing, orthopnea, PND, increased LE swelling, palpitations, dizziness or syncope.  Pt denies new neurological symptoms such as new headache, or facial or extremity weakness.  Pt denies polydipsia, polyuria, Past Medical History  Diagnosis Date  . DIAB W/UNSPEC COMP TYPE II/UNSPEC TYPE UNCNTRL 08/24/2008  . OBESITY, NOS 03/18/2006  . HYPERTENSION, BENIGN SYSTEMIC 03/18/2006  . GASTROESOPHAGEAL REFLUX, NO ESOPHAGITIS 03/18/2006   Past Surgical History  Procedure Laterality Date  . Cholecystectomy    . Tubal ligation  2004    reports that she has never smoked. She does not have any smokeless tobacco history on file. She reports that she does not drink alcohol or use illicit drugs. family history includes Diabetes in her mother; Hypertension in her father and mother. No Known Allergies Current Outpatient Prescriptions on File Prior to Visit  Medication Sig Dispense Refill  . fluticasone (FLONASE) 50 MCG/ACT nasal spray Place 2 sprays into both nostrils daily. 32 g 5  . lisinopril-hydrochlorothiazide (PRINZIDE,ZESTORETIC) 20-25 MG per tablet Take 1 tablet by mouth daily. 90 tablet 1  . metFORMIN (GLUCOPHAGE) 1000 MG tablet Take 1 tablet (1,000 mg total) by mouth 2 (two) times daily with a meal. 180 tablet 1  . sitaGLIPtin (JANUVIA) 100 MG tablet Take 1 tablet (100 mg total) by mouth daily. 30 tablet 6  . triamcinolone ointment (KENALOG) 0.5 % Apply topically 2 (two) times daily. 30 g 0  . aspirin EC 81 MG tablet Take 81  mg by mouth daily.    . cetirizine (ZYRTEC) 10 MG tablet Take 1 tablet (10 mg total) by mouth daily. 30 tablet 11   No current facility-administered medications on file prior to visit.    Review of Systems  Constitutional: Negative for unusual diaphoresis or night sweats HENT: Negative for ringing in ear or discharge Eyes: Negative for double vision or worsening visual disturbance.  Respiratory: Negative for choking and stridor.   Gastrointestinal: Negative for vomiting or other signifcant bowel change Genitourinary: Negative for hematuria or change in urine volume.  Musculoskeletal: Negative for other MSK pain or swelling Skin: Negative for color change and worsening wound.  Neurological: Negative for tremors and numbness other than noted  Psychiatric/Behavioral: Negative for decreased concentration or agitation other than above       Objective:   Physical Exam BP 104/70 mmHg  Pulse 94  Temp(Src) 98.2 F (36.8 C) (Oral)  Ht 5' (1.524 m)  Wt 251 lb (113.853 kg)  BMI 49.02 kg/m2  SpO2 98% VS noted, not ill appearing Constitutional: Pt appears in no significant distress HENT: Head: NCAT.  Right Ear: External ear normal.  Left Ear: External ear normal.  Eyes: . Pupils are equal, round, and reactive to light. Conjunctivae and EOM are normal Bilat tm's with mild erythema.  Max sinus areas non tender.  Pharynx with mild erythema, no exudate  Neck: Normal range of motion. Neck supple.  Cardiovascular: Normal rate and regular rhythm.   Pulmonary/Chest: Effort normal and breath sounds without rales or wheezing.  Neurological: Pt is  alert. Not confused , motor grossly intact Skin: Skin is warm. No rash, no LE edema Psychiatric: Pt behavior is normal. No agitation.     Assessment & Plan:

## 2014-09-28 NOTE — Assessment & Plan Note (Signed)
stable overall by history and exam, recent data reviewed with pt, and pt to continue medical treatment as before,  to f/u any worsening symptoms or concerns Lab Results  Component Value Date   HGBA1C 7.3* 05/04/2014   Pt to call for worsening polys or cbg > 200 with steroid tx

## 2014-09-28 NOTE — Assessment & Plan Note (Signed)
stable overall by history and exam, recent data reviewed with pt, and pt to continue medical treatment as before,  to f/u any worsening symptoms or concerns Lab Results  Component Value Date   LDLCALC 84 05/04/2014

## 2014-09-28 NOTE — Assessment & Plan Note (Signed)
With liekly seasonal flare despite good med compliance, to add singulair daily, but also depomedrol IM and predpac asd,  to f/u any worsening symptoms or concerns

## 2014-09-28 NOTE — Assessment & Plan Note (Signed)
stable overall by history and exam, recent data reviewed with pt, and pt to continue medical treatment as before,  to f/u any worsening symptoms or concerns BP Readings from Last 3 Encounters:  09/26/14 104/70  05/04/14 112/72  07/07/13 130/84

## 2014-10-29 ENCOUNTER — Encounter: Payer: Self-pay | Admitting: Internal Medicine

## 2014-10-29 ENCOUNTER — Ambulatory Visit (INDEPENDENT_AMBULATORY_CARE_PROVIDER_SITE_OTHER): Payer: Federal, State, Local not specified - PPO | Admitting: Internal Medicine

## 2014-10-29 ENCOUNTER — Other Ambulatory Visit (INDEPENDENT_AMBULATORY_CARE_PROVIDER_SITE_OTHER): Payer: Federal, State, Local not specified - PPO

## 2014-10-29 VITALS — BP 100/62 | HR 84 | Temp 98.6°F | Resp 12 | Ht 60.0 in | Wt 244.0 lb

## 2014-10-29 DIAGNOSIS — E119 Type 2 diabetes mellitus without complications: Secondary | ICD-10-CM

## 2014-10-29 DIAGNOSIS — Z23 Encounter for immunization: Secondary | ICD-10-CM | POA: Diagnosis not present

## 2014-10-29 DIAGNOSIS — Z6841 Body Mass Index (BMI) 40.0 and over, adult: Secondary | ICD-10-CM | POA: Diagnosis not present

## 2014-10-29 LAB — COMPREHENSIVE METABOLIC PANEL
ALBUMIN: 3.8 g/dL (ref 3.5–5.2)
ALK PHOS: 105 U/L (ref 39–117)
ALT: 17 U/L (ref 0–35)
AST: 12 U/L (ref 0–37)
BUN: 10 mg/dL (ref 6–23)
CALCIUM: 9.4 mg/dL (ref 8.4–10.5)
CO2: 29 mEq/L (ref 19–32)
Chloride: 101 mEq/L (ref 96–112)
Creatinine, Ser: 0.9 mg/dL (ref 0.40–1.20)
GFR: 86.39 mL/min (ref >60.00)
Glucose, Bld: 103 mg/dL — ABNORMAL HIGH (ref 70–99)
POTASSIUM: 3.9 meq/L (ref 3.5–5.1)
Sodium: 137 mEq/L (ref 135–145)
TOTAL PROTEIN: 7.3 g/dL (ref 6.0–8.3)
Total Bilirubin: 0.3 mg/dL (ref 0.2–1.2)

## 2014-10-29 LAB — HEMOGLOBIN A1C: Hgb A1c MFr Bld: 7.1 % — ABNORMAL HIGH (ref 4.6–6.5)

## 2014-10-29 LAB — MAGNESIUM: MAGNESIUM: 1.5 mg/dL (ref 1.5–2.5)

## 2014-10-29 MED ORDER — EXENATIDE ER 2 MG ~~LOC~~ SRER: 2.0000 mg | SUBCUTANEOUS | Status: DC

## 2014-10-29 MED ORDER — GLIPIZIDE ER 5 MG PO TB24: 5.0000 mg | ORAL_TABLET | Freq: Every day | ORAL | Status: DC

## 2014-10-29 NOTE — Progress Notes (Signed)
   Subjective:    Patient ID: [name], female    DOB: 06-Dec-1965, 49 y.o.   MRN: [id number]  HPI The patient is a 49 YO female coming in for follow up of her diabetes. She has been working on increasing her exercise and has lost 6 pounds since last visit. She is still taking her metformin and her Tonga but wants to know if we can find something cheaper since it is $50 per month. Her diet has been about the same. No new chest pains, SOB, abdominal pains. Denies diarrhea or constipation.   Review of Systems  Constitutional: Negative for activity change, appetite change and fatigue.  Respiratory: Negative.   Cardiovascular: Negative.   Gastrointestinal: Negative.   Musculoskeletal: Negative.   Skin: Negative.   Neurological: Negative.   Psychiatric/Behavioral: Negative.       Objective:   Physical Exam  Constitutional: She is oriented to person, place, and time. She appears well-developed and well-nourished.  Overweight  HENT:  Head: Normocephalic and atraumatic.  Eyes: EOM are normal.  Neck: Normal range of motion. No thyromegaly present.  Cardiovascular: Normal rate and regular rhythm.   Pulmonary/Chest: Effort normal and breath sounds normal. No respiratory distress. She has no wheezes.  Abdominal: Soft. She exhibits no distension. There is no tenderness.  Musculoskeletal: She exhibits no edema.  Neurological: She is alert and oriented to person, place, and time. Coordination normal.  Skin: Skin is warm and dry.   Filed Vitals:   10/29/14 1015  BP: 100/62  Pulse: 84  Temp: 98.6 F (37 C)  TempSrc: Oral  Resp: 12  Height: 5' (1.524 m)  Weight: 244 lb (110.678 kg)  SpO2: 94%      Assessment & Plan:  Flu shot given at visit.

## 2014-10-29 NOTE — Assessment & Plan Note (Signed)
Weight down about 6 pounds since last visit. Congratulated her on that and encouraged her to keep up with the exercise and make some diet changes as well. Complicated by hypertension and diabetes.

## 2014-10-29 NOTE — Assessment & Plan Note (Signed)
No complications. Checking HgA1c today and adjust as needed. Will send in bydureon as well as glipizide so she can check the out of pocket cost. She likes that the bydureon would help her lose weight but may need glipizide as it is less expensive. She will continue metformin and depending on cost continue Tonga. Reminded about the importance of yearly eye exam. Weight is down about 6 pounds which is helpful.

## 2014-10-29 NOTE — Patient Instructions (Addendum)
We will check the blood work today and call you back with the results.  We have sent in bydureon and glipizide so that you can check on the cost. If you decide to do the glipizide then take 1 pill daily. This one could cause you to gain back some weight. The bydrueon is a shot that you take weekly and helps you to lose more weight than then Tonga but may cost about the same as the Tong[place]. If you decide to do that you can bring the shot to the office so we can teach you how to use it.   Good job with the weight loss of 6 pounds! Keep up the good work.   Diabetes and Exercise Exercising regularly is important. It is not just about losing weight. It has many health benefits, such as:  Improving your overall fitness, flexibility, and endurance.  Increasing your bone density.  Helping with weight control.  Decreasing your body fat.  Increasing your muscle strength.  Reducing stress and tension.  Improving your overall health. People with diabetes who exercise gain additional benefits because exercise:  Reduces appetite.  Improves the body's use of blood sugar (glucose).  Helps lower or control blood glucose.  Decreases blood pressure.  Helps control blood lipids (such as cholesterol and triglycerides).  Improves the body's use of the hormone insulin by:  Increasing the body's insulin sensitivity.  Reducing the body's insulin needs.  Decreases the risk for heart disease because exercising:  Lowers cholesterol and triglycerides levels.  Increases the levels of good cholesterol (such as high-density lipoproteins [HDL]) in the body.  Lowers blood glucose levels. YOUR ACTIVITY PLAN  Choose an activity that you enjoy, and set realistic goals. To exercise safely, you should begin practicing any new physical activity slowly, and gradually increase the intensity of the exercise over time. Your health care provider or diabetes educator can help create an activity plan that works  for you. General recommendations include:  Encouraging children to engage in at least 60 minutes of physical activity each day.  Stretching and performing strength training exercises, such as yoga or weight lifting, at least 2 times per week.  Performing a total of at least 150 minutes of moderate-intensity exercise each week, such as brisk walking or water aerobics.  Exercising at least 3 days per week, making sure you allow no more than 2 consecutive days to pass without exercising.  Avoiding long periods of inactivity (90 minutes or more). When you have to spend an extended period of time sitting down, take frequent breaks to walk or stretch. RECOMMENDATIONS FOR EXERCISING WITH TYPE 1 OR TYPE 2 DIABETES   Check your blood glucose before exercising. If blood glucose levels are greater than 240 mg/dL, check for urine ketones. Do not exercise if ketones are present.  Avoid injecting insulin into areas of the body that are going to be exercised. For example, avoid injecting insulin into:  The arms when playing tennis.  The legs when jogging.  Keep a record of:  Food intake before and after you exercise.  Expected peak times of insulin action.  Blood glucose levels before and after you exercise.  The type and amount of exercise you have done.  Review your records with your health care provider. Your health care provider will help you to develop guidelines for adjusting food intake and insulin amounts before and after exercising.  If you take insulin or oral hypoglycemic agents, watch for signs and symptoms of hypoglycemia. They include:  Dizziness.  Shaking.  Sweating.  Chills.  Confusion.  Drink plenty of water while you exercise to prevent dehydration or heat stroke. Body water is lost during exercise and must be replaced.  Talk to your health care provider before starting an exercise program to make sure it is safe for you. Remember, almost any type of activity is  better than none.   This information is not intended to replace advice given to you by your health care provider. Make sure you discuss any questions you have with your health care provider.   Document Released: 03/28/2003 Document Revised: 05/22/2014 Document Reviewed: 06/14/2012 Elsevier Interactive Patient Education [employer].

## 2014-10-29 NOTE — Progress Notes (Signed)
Pre visit review using our clinic review tool, if applicable. No additional management support is needed unless otherwise documented below in the visit note. 

## 2014-11-08 ENCOUNTER — Ambulatory Visit (INDEPENDENT_AMBULATORY_CARE_PROVIDER_SITE_OTHER): Payer: Federal, State, Local not specified - PPO | Admitting: Internal Medicine

## 2014-11-08 ENCOUNTER — Encounter: Payer: Self-pay | Admitting: Internal Medicine

## 2014-11-08 VITALS — BP 104/76 | HR 90 | Temp 98.6°F | Resp 18 | Wt 244.0 lb

## 2014-11-08 DIAGNOSIS — J069 Acute upper respiratory infection, unspecified: Secondary | ICD-10-CM

## 2014-11-08 MED ORDER — GUAIFENESIN-CODEINE 100-10 MG/5ML PO SYRP: 5.0000 mL | ORAL_SOLUTION | Freq: Three times a day (TID) | ORAL | Status: DC | PRN

## 2014-11-08 MED ORDER — AZITHROMYCIN 250 MG PO TABS: ORAL_TABLET | ORAL | Status: DC

## 2014-11-08 NOTE — Progress Notes (Signed)
Pre visit review using our clinic review tool, if applicable. No additional management support is needed unless otherwise documented below in the visit note. 

## 2014-11-08 NOTE — Patient Instructions (Signed)
An antibiotic was sent to your pharmacy.  You were also given a prescription for a cough syrup for nighttime.  Upper Respiratory Infection, Adult Most upper respiratory infections (URIs) are a viral infection of the air passages leading to the lungs. A URI affects the nose, throat, and upper air passages. The most common type of URI is nasopharyngitis and is typically referred to as "the common cold." URIs run their course and usually go away on their own. Most of the time, a URI does not require medical attention, but sometimes a bacterial infection in the upper airways can follow a viral infection. This is called a secondary infection. Sinus and middle ear infections are common types of secondary upper respiratory infections. Bacterial pneumonia can also complicate a URI. A URI can worsen asthma and chronic obstructive pulmonary disease (COPD). Sometimes, these complications can require emergency medical care and may be life threatening.  CAUSES Almost all URIs are caused by viruses. A virus is a type of germ and can spread from one person to another.  RISKS FACTORS You may be at risk for a URI if:   You smoke.   You have chronic heart or lung disease.  You have a weakened defense (immune) system.   You are very young or very old.   You have nasal allergies or asthma.  You work in crowded or poorly ventilated areas.  You work in health care facilities or schools. SIGNS AND SYMPTOMS  Symptoms typically develop 2-3 days after you come in contact with a cold virus. Most viral URIs last 7-10 days. However, viral URIs from the influenza virus (flu virus) can last 14-18 days and are typically more severe. Symptoms may include:   Runny or stuffy (congested) nose.   Sneezing.   Cough.   Sore throat.   Headache.   Fatigue.   Fever.   Loss of appetite.   Pain in your forehead, behind your eyes, and over your cheekbones (sinus pain).  Muscle aches.  DIAGNOSIS  Your  health care provider may diagnose a URI by:  Physical exam.  Tests to check that your symptoms are not due to another condition such as:  Strep throat.  Sinusitis.  Pneumonia.  Asthma. TREATMENT  A URI goes away on its own with time. It cannot be cured with medicines, but medicines may be prescribed or recommended to relieve symptoms. Medicines may help:  Reduce your fever.  Reduce your cough.  Relieve nasal congestion. HOME CARE INSTRUCTIONS   Take medicines only as directed by your health care provider.   Gargle warm saltwater or take cough drops to comfort your throat as directed by your health care provider.  Use a warm mist humidifier or inhale steam from a shower to increase air moisture. This may make it easier to breathe.  Drink enough fluid to keep your urine clear or pale yellow.   Eat soups and other clear broths and maintain good nutrition.   Rest as needed.   Return to work when your temperature has returned to normal or as your health care provider advises. You may need to stay home longer to avoid infecting others. You can also use a face mask and careful hand washing to prevent spread of the virus.  Increase the usage of your inhaler if you have asthma.   Do not use any tobacco products, including cigarettes, chewing tobacco, or electronic cigarettes. If you need help quitting, ask your health care provider. PREVENTION  The best way to protect  yourself from getting a cold is to practice good hygiene.   Avoid oral or hand contact with people with cold symptoms.   Wash your hands often if contact occurs.  There is no clear evidence that vitamin C, vitamin E, echinacea, or exercise reduces the chance of developing a cold. However, it is always recommended to get plenty of rest, exercise, and practice good nutrition.  SEEK MEDICAL CARE IF:   You are getting worse rather than better.   Your symptoms are not controlled by medicine.   You have  chills.  You have worsening shortness of breath.  You have brown or red mucus.  You have yellow or brown nasal discharge.  You have pain in your face, especially when you bend forward.  You have a fever.  You have swollen neck glands.  You have pain while swallowing.  You have white areas in the back of your throat. SEEK IMMEDIATE MEDICAL CARE IF:   You have severe or persistent:  Headache.  Ear pain.  Sinus pain.  Chest pain.  You have chronic lung disease and any of the following:  Wheezing.  Prolonged cough.  Coughing up blood.  A change in your usual mucus.  You have a stiff neck.  You have changes in your:  Vision.  Hearing.  Thinking.  Mood. MAKE SURE YOU:   Understand these instructions.  Will watch your condition.  Will get help right away if you are not doing well or get worse.   This information is not intended to replace advice given to you by your health care provider. Make sure you discuss any questions you have with your health care provider.   Document Released: 07/01/2000 Document Revised: 05/22/2014 Document Reviewed: 04/12/2013 Elsevier Interactive Patient Education [employer].

## 2014-11-08 NOTE — Progress Notes (Signed)
Subjective:    Patient ID: [name], female    DOB: [birth year], 49 y.o.   MRN: [id number]  HPI  She is here with cold symptoms.  She has had cold symptoms for over a month. When she was here over a month ago she did receive a prednisone injection, prednisone taper and Singulair. She did feel better, but the cough continued. She has been taking Mucinex DM, Flonase, Zyrtec, Alka-Seltzer DM and she even tried castor oil without improvement of her symptoms. She continues to feel sick and just wants to feel better.  She has a decreased appetite, fevers, chills, fatigue, nasal congestion, ear pain, runny nose, productive cough, wheezing and headaches. She denies any shortness of breath. She has been coughing up yellow phlegm.    Medications and allergies reviewed with patient and updated if appropriate.  Patient Active Problem List   Diagnosis Date Noted  . Hyperlipidemia 09/26/2014  . Allergic rhinitis 06/30/2013  . Bilateral lower extremity edema 04/07/2011  . Diabetes mellitus type II, controlled ([place]) 08/24/2008  . Morbid obesity with BMI of 45.0-49.9, adult ([place]) 03/18/2006  . HYPERTENSION, BENIGN SYSTEMIC 03/18/2006  . GASTROESOPHAGEAL REFLUX, NO ESOPHAGITIS 03/18/2006    Past Medical History  Diagnosis Date  . DIAB W/UNSPEC COMP TYPE II/UNSPEC TYPE UNCNTRL 08/24/2008  . OBESITY, NOS 03/18/2006  . HYPERTENSION, BENIGN SYSTEMIC 03/18/2006  . GASTROESOPHAGEAL REFLUX, NO ESOPHAGITIS 03/18/2006    Past Surgical History  Procedure Laterality Date  . Cholecystectomy    . Tubal ligation  2004    Social History   Social History  . Marital Status: Single    Spouse Name: N/A  . Number of Children: N/A  . Years of Education: N/A   Social History Main Topics  . Smoking status: Never Smoker   . Smokeless tobacco: None  . Alcohol Use: No  . Drug Use: No  . Sexual Activity: Yes    Birth Control/ Protection: Surgical   Other Topics Concern  . None   Social History  Narrative    Review of Systems  Constitutional: Positive for fever, chills, appetite change and fatigue.  HENT: Positive for congestion, ear pain (left), postnasal drip, rhinorrhea and sneezing. Negative for sinus pressure and sore throat.   Respiratory: Positive for cough (productive, little yellowish) and wheezing (? when laying down). Negative for shortness of breath.   Cardiovascular: Positive for chest pain (from coughing).  Gastrointestinal: Negative for nausea and abdominal pain.  Musculoskeletal: Positive for myalgias (intermittent).  Neurological: Positive for headaches. Negative for dizziness and light-headedness.       Objective:   Filed Vitals:   11/08/14 1128  BP: 104/76  Pulse: 90  Temp: 98.6 F (37 C)  Resp: 18   Filed Weights   11/08/14 1128  Weight: 244 lb (110.678 kg)   Body mass index is 47.65 kg/(m^2).   Physical Exam  Constitutional: She appears well-developed and well-nourished. No distress.  She appears mildly ill  HENT:  Head: Normocephalic and atraumatic.  Right Ear: External ear normal.  Left Ear: External ear normal.  Mouth/Throat: Oropharynx is clear and moist. No oropharyngeal exudate.  Eyes: Conjunctivae are normal.  Neck: Neck supple. No tracheal deviation present. No thyromegaly present.  Cardiovascular: Normal rate, regular rhythm and normal heart sounds.   Pulmonary/Chest: Effort normal and breath sounds normal. No respiratory distress. She has no wheezes.  Musculoskeletal: She exhibits no edema.  Lymphadenopathy:    She has no cervical adenopathy.  Upper respiratory infection Given duration of symptoms I think she warrants an antibiotic Z-Pak  We'll prescribe a cough syrup for nighttime Continue over-the-counter cold medications Continue increased rest and fluids Call if no improvement

## 2014-11-09 ENCOUNTER — Ambulatory Visit: Payer: Federal, State, Local not specified - PPO | Admitting: Internal Medicine

## 2015-01-01 ENCOUNTER — Telehealth: Payer: Self-pay | Admitting: Internal Medicine

## 2015-01-01 NOTE — Telephone Encounter (Signed)
Patient Name: PAULINE RITCHIE DOB: Oct 30, 1965 Initial Comment Caller states she has been having diarrhea for several days Nurse Assessment Nurse: Marcelline Deist, RN, Kermit Balo Date/Time (Eastern Time): 01/01/2015 11:40:46 AM Confirm and document reason for call. If symptomatic, describe symptoms. ---Caller states she has been having frequent & watery diarrhea & vomiting every 30 minutes for several days. Had chills & fever as well. Has the patient traveled out of the country within the last 30 days? ---Not Applicable Does the patient have any new or worsening symptoms? ---Yes Will a triage be completed? ---Yes Related visit to physician within the last 2 weeks? ---No Does the PT have any chronic conditions? (i.e. diabetes, asthma, etc.) ---Yes List chronic conditions. ---diabetes, high BP rx Did the patient indicate they were pregnant? ---No Is this a behavioral health or substance abuse call? ---No Guidelines Guideline Title Affirmed Question Affirmed Notes Vomiting [1] MILD or MODERATE vomiting AND [2] present > 48 hours (2 days) (Exception: mild vomiting with associated diarrhea) Final Disposition User See Physician within [place], RN, [employer] does not want an appt. right now. If she doesn't start feeling better, she will call later to schedule one. Referrals GO TO FACILITY UNDECIDED Disagree/Comply: Comply

## 2015-01-04 ENCOUNTER — Encounter: Payer: Self-pay | Admitting: Internal Medicine

## 2015-01-04 ENCOUNTER — Ambulatory Visit (INDEPENDENT_AMBULATORY_CARE_PROVIDER_SITE_OTHER): Payer: Federal, State, Local not specified - PPO | Admitting: Internal Medicine

## 2015-01-04 VITALS — BP 128/88 | HR 99 | Temp 98.4°F | Resp 18 | Wt 246.0 lb

## 2015-01-04 DIAGNOSIS — B349 Viral infection, unspecified: Secondary | ICD-10-CM | POA: Diagnosis not present

## 2015-01-04 MED ORDER — GUAIFENESIN-CODEINE 100-10 MG/5ML PO SYRP: 5.0000 mL | ORAL_SOLUTION | Freq: Three times a day (TID) | ORAL | Status: DC | PRN

## 2015-01-04 NOTE — Progress Notes (Signed)
Pre visit review using our clinic review tool, if applicable. No additional management support is needed unless otherwise documented below in the visit note. 

## 2015-01-04 NOTE — Patient Instructions (Signed)

## 2015-01-04 NOTE — Progress Notes (Signed)
Subjective:    Patient ID: [name], female    DOB: [birth year], 49 y.o.   MRN: [id number]  HPI She is here for an acute visit for cold symptoms.   Her symptoms strated 5 nights ago night.  She started having  and diarrhea 4 days ago.  She vomited that day.  She had these symptoms up until two days ago and she has not had nausea/vomiting since then.  Her bowels are not completely normal, but she is not having diarrhea.  She called and talked to the nurse three days ago.  She started to have sore throat, cough and feels like her symptoms have not been getting better.    She started taking mucinex dm, sudafed, allergy medications, flonase.  She is eating soup and resting.  She did not go to work on Tuesday and left early Monday and yesterday.    Medications and allergies reviewed with patient and updated if appropriate.  Patient Active Problem List   Diagnosis Date Noted  . Hyperlipidemia 09/26/2014  . Allergic rhinitis 06/30/2013  . Bilateral lower extremity edema 04/07/2011  . Diabetes mellitus type II, controlled ([place]) 08/24/2008  . Morbid obesity with BMI of 45.0-49.9, adult ([place]) 03/18/2006  . HYPERTENSION, BENIGN SYSTEMIC 03/18/2006  . GASTROESOPHAGEAL REFLUX, NO ESOPHAGITIS 03/18/2006    Current Outpatient Prescriptions on File Prior to Visit  Medication Sig Dispense Refill  . Exenatide ER (BYDUREON) 2 MG SRER Inject 2 mg into the skin once a week. 4 each 6  . fluticasone (FLONASE) 50 MCG/ACT nasal spray Place 2 sprays into both nostrils daily. 32 g 5  . glipiZIDE (GLUCOTROL XL) 5 MG 24 hr tablet Take 1 tablet (5 mg total) by mouth daily with breakfast. 30 tablet 6  . guaiFENesin-codeine (ROBITUSSIN AC) 100-10 MG/5ML syrup Take 5 mLs by mouth 3 (three) times daily as needed for cough. 120 mL 0  . lisinopril-hydrochlorothiazide (PRINZIDE,ZESTORETIC) 20-25 MG per tablet Take 1 tablet by mouth daily. 90 tablet 1  . metFORMIN (GLUCOPHAGE) 1000 MG tablet Take 1 tablet  (1,000 mg total) by mouth 2 (two) times daily with a meal. 180 tablet 1  . sitaGLIPtin (JANUVIA) 100 MG tablet Take 1 tablet (100 mg total) by mouth daily. 30 tablet 6  . triamcinolone ointment (KENALOG) 0.5 % Apply topically 2 (two) times daily. 30 g 0  . aspirin EC 81 MG tablet Take 81 mg by mouth daily.    . cetirizine (ZYRTEC) 10 MG tablet Take 1 tablet (10 mg total) by mouth daily. 30 tablet 11   No current facility-administered medications on file prior to visit.    Past Medical History  Diagnosis Date  . DIAB W/UNSPEC COMP TYPE II/UNSPEC TYPE UNCNTRL 08/24/2008  . OBESITY, NOS 03/18/2006  . HYPERTENSION, BENIGN SYSTEMIC 03/18/2006  . GASTROESOPHAGEAL REFLUX, NO ESOPHAGITIS 03/18/2006    Past Surgical History  Procedure Laterality Date  . Cholecystectomy    . Tubal ligation  2004    Social History   Social History  . Marital Status: Single    Spouse Name: N/A  . Number of Children: N/A  . Years of Education: N/A   Social History Main Topics  . Smoking status: Never Smoker   . Smokeless tobacco: None  . Alcohol Use: No  . Drug Use: No  . Sexual Activity: Yes    Birth Control/ Protection: Surgical   Other Topics Concern  . None   Social History Narrative    Review of Systems  Constitutional: Positive for chills (resolved) and appetite change (decreased). Negative for fever.  HENT: Positive for congestion, sinus pressure (mild ) and sore throat (possibly from cough). Negative for ear pain.   Respiratory: Positive for cough (dry, wet at times). Negative for shortness of breath and wheezing.   Cardiovascular: Negative for chest pain.  Gastrointestinal: Positive for nausea, vomiting, abdominal pain and diarrhea.  Musculoskeletal: Positive for myalgias (resolved).  Neurological: Positive for headaches (mild, yesterday). Negative for dizziness and light-headedness.       Objective:   Filed Vitals:   01/04/15 1022  BP: 128/88  Pulse: 99  Temp: 98.4 F (36.9 C)    Resp: 18   Filed Weights   01/04/15 1022  Weight: 246 lb (111.585 kg)   Body mass index is 48.04 kg/(m^2).   Physical Exam GENERAL APPEARANCE: Appears stated age, well appearing, NAD EYES: conjunctiva clear, no icterus HEENT: bilateral tympanic membranes and ear canals normal, oropharynx with no erythema, no thyromegaly, trachea midline, no cervical or supraclavicular lymphadenopathy LUNGS: Clear to auscultation without wheeze or crackles, unlabored breathing, good air entry bilaterally HEART: Normal S1,S2 without murmurs ABD: soft, nontender EXTREMITIES: Without clubbing, cyanosis, or edema        Assessment & Plan:   Viral illness Her symptoms are consistent with a viral illness No need for an antibiotic or steroid injection Cough syrup with codeine given  Increase rest Drink plenty of fluids Continue current otc medications  Call if no improvement

## 2015-01-15 ENCOUNTER — Other Ambulatory Visit: Payer: Self-pay | Admitting: Internal Medicine

## 2015-01-26 ENCOUNTER — Encounter (HOSPITAL_COMMUNITY): Payer: Self-pay | Admitting: *Deleted

## 2015-01-26 ENCOUNTER — Emergency Department (HOSPITAL_COMMUNITY): Payer: Federal, State, Local not specified - PPO

## 2015-01-26 ENCOUNTER — Emergency Department (HOSPITAL_COMMUNITY)
Admission: EM | Admit: 2015-01-26 | Discharge: 2015-01-26 | Disposition: A | Discharge status: Home / Self Care | Payer: Federal, State, Local not specified - PPO | Attending: Emergency Medicine | Admitting: Emergency Medicine

## 2015-01-26 DIAGNOSIS — Y9289 Other specified places as the place of occurrence of the external cause: Secondary | ICD-10-CM | POA: Insufficient documentation

## 2015-01-26 DIAGNOSIS — S99911A Unspecified injury of right ankle, initial encounter: Secondary | ICD-10-CM | POA: Diagnosis present

## 2015-01-26 DIAGNOSIS — Z79899 Other long term (current) drug therapy: Secondary | ICD-10-CM | POA: Diagnosis not present

## 2015-01-26 DIAGNOSIS — I10 Essential (primary) hypertension: Secondary | ICD-10-CM | POA: Insufficient documentation

## 2015-01-26 DIAGNOSIS — Y998 Other external cause status: Secondary | ICD-10-CM | POA: Diagnosis not present

## 2015-01-26 DIAGNOSIS — W010XXA Fall on same level from slipping, tripping and stumbling without subsequent striking against object, initial encounter: Secondary | ICD-10-CM | POA: Insufficient documentation

## 2015-01-26 DIAGNOSIS — S82831A Other fracture of upper and lower end of right fibula, initial encounter for closed fracture: Secondary | ICD-10-CM

## 2015-01-26 DIAGNOSIS — E119 Type 2 diabetes mellitus without complications: Secondary | ICD-10-CM | POA: Insufficient documentation

## 2015-01-26 DIAGNOSIS — Z7982 Long term (current) use of aspirin: Secondary | ICD-10-CM | POA: Insufficient documentation

## 2015-01-26 DIAGNOSIS — E669 Obesity, unspecified: Secondary | ICD-10-CM | POA: Diagnosis not present

## 2015-01-26 DIAGNOSIS — S82391A Other fracture of lower end of right tibia, initial encounter for closed fracture: Secondary | ICD-10-CM | POA: Insufficient documentation

## 2015-01-26 DIAGNOSIS — Z7984 Long term (current) use of oral hypoglycemic drugs: Secondary | ICD-10-CM | POA: Insufficient documentation

## 2015-01-26 DIAGNOSIS — Z8719 Personal history of other diseases of the digestive system: Secondary | ICD-10-CM | POA: Diagnosis not present

## 2015-01-26 DIAGNOSIS — S82301A Unspecified fracture of lower end of right tibia, initial encounter for closed fracture: Secondary | ICD-10-CM

## 2015-01-26 DIAGNOSIS — Y9301 Activity, walking, marching and hiking: Secondary | ICD-10-CM | POA: Diagnosis not present

## 2015-01-26 MED ORDER — OXYCODONE-ACETAMINOPHEN 5-325 MG PO TABS
1.0000 | ORAL_TABLET | Freq: Once | ORAL | Status: AC
  Administered 2015-01-26: 1 via ORAL
  Filled 2015-01-26: qty 1

## 2015-01-26 MED ORDER — OXYCODONE-ACETAMINOPHEN 5-325 MG PO TABS: 2.0000 | ORAL_TABLET | ORAL | Status: DC | PRN

## 2015-01-26 NOTE — ED Notes (Signed)
Pt reports she slipped on ice located on the front porch. Pt reports rt ankle pain.

## 2015-01-26 NOTE — Progress Notes (Signed)
Orthopedic Tech Progress Note Patient Details:  [name] Jun 15, 1965 [id number]  Ortho Devices Type of Ortho Device: Ace wrap, Crutches, Stirrup splint Ortho Device/Splint Location: RLE Ortho Device/Splint Interventions: Ordered, Application   Braulio Bosch 01/26/2015, 7:47 PM

## 2015-01-26 NOTE — ED Notes (Signed)
Called Ortho and he advised he would be down as soon as he could.

## 2015-01-26 NOTE — ED Notes (Signed)
Pt left at this time with all belongings.  

## 2015-01-26 NOTE — Discharge Instructions (Signed)
Cast or Splint Care Casts and splints support injured limbs and keep bones from moving while they heal.  HOME CARE  Keep the cast or splint uncovered during the drying period.  A plaster cast can take 24 to 48 hours to dry.  A fiberglass cast will dry in less than 1 hour.  Do not rest the cast on anything harder than a pillow for 24 hours.  Do not put weight on your injured limb. Do not put pressure on the cast. Wait for your doctor's approval.  Keep the cast or splint dry.  Cover the cast or splint with a plastic bag during baths or wet weather.  If you have a cast over your chest and belly (trunk), take sponge baths until the cast is taken off.  If your cast gets wet, dry it with a towel or blow dryer. Use the cool setting on the blow dryer.  Keep your cast or splint clean. Wash a dirty cast with a damp cloth.  Do not put any objects under your cast or splint.  Do not scratch the skin under the cast with an object. If itching is a problem, use a blow dryer on a cool setting over the itchy area.  Do not trim or cut your cast.  Do not take out the padding from inside your cast.  Exercise your joints near the cast as told by your doctor.  Raise (elevate) your injured limb on 1 or 2 pillows for the first 1 to 3 days. GET HELP IF:  Your cast or splint cracks.  Your cast or splint is too tight or too loose.  You itch badly under the cast.  Your cast gets wet or has a soft spot.  You have a bad smell coming from the cast.  You get an object stuck under the cast.  Your skin around the cast becomes red or sore.  You have new or more pain after the cast is put on. GET HELP RIGHT AWAY IF:  You have fluid leaking through the cast.  You cannot move your fingers or toes.  Your fingers or toes turn blue or white or are cool, painful, or puffy (swollen).  You have tingling or lose feeling (numbness) around the injured area.  You have bad pain or pressure under the  cast.  You have trouble breathing or have shortness of breath.  You have chest pain.   This information is not intended to replace advice given to you by your health care provider. Make sure you discuss any questions you have with your health care provider.   Document Released: 05/07/2010 Document Revised: 09/07/2012 Document Reviewed: 07/14/2012 Elsevier Interactive Patient Education 2016 [employer]  Tibial and Fibular Fracture, Adult Tibial and fibular fracture is a break in the bones of your lower leg (tibia and fibula). The tibia is the larger of these two bones. The fibula is the smaller of the two bones. It is on the outer side of your leg.  CAUSES  Low-energy injuries, such as a fall from ground level.  High-energy injuries, such as motor vehicle injuries, gunshot wounds, or high-speed sports collisions. RISK FACTORS  Jumping activities.  Repetitive stress, such as long-distance running.  Participation in sports.  Osteoporosis.  Advanced age. SIGNS AND SYMPTOMS  Pain.  Swelling.  Inability to put weight on your injured leg.  Bone deformities at the site of your injury.  Bruising. DIAGNOSIS  Tibial and fibular fractures are diagnosed with the use of X-ray  exams. TREATMENT  If you have a simple fracture of these two bones, they can be treated with simple immobilization. A cast or splint will be used on your leg to keep it from moving while it heals. Then you can begin range-of-motion exercises to regain your knee motion. HOME CARE INSTRUCTIONS   Apply ice to your leg:  Put ice in a plastic bag.  Place a towel between your skin and the bag.  Leave the ice on for 20 minutes, 2-3 times a day.  If you have a plaster or fiberglass cast:  Do not try to scratch the skin under the cast using sharp or pointed objects.  Check the skin around the cast every day. You may put lotion on any red or sore areas.  Keep your cast dry and clean.  If you have a  plaster splint:  Wear the splint as directed.  You may loosen the elastic around the splint if your toes become numb, tingle, or turn cold or blue.  Do not put pressure on any part of your cast or splint until it is fully hardened, because it may deform.  Your cast or splint can be protected during bathing with a plastic bag. Do not lower the cast or splint into water.  Use crutches as directed.  Only take over-the-counter or prescription medicines for pain, discomfort, or fever as directed by your health care provider.  Follow all instructions given to you by your health care provider.  Make and keep all follow-up appointments. SEEK MEDICAL CARE IF:  Your pain is becoming worse rather than better or is not controlled with medicines.  You have increased swelling or redness in the foot.  You begin to lose feeling in your foot or toes. SEEK IMMEDIATE MEDICAL CARE IF:  You develop a cold or blue foot or toes on the injured side.  You develop severe pain in your injured leg, especially if the pain is increased with movement of your toes. MAKE SURE YOU:  Understand these instructions.  Will watch your condition.  Will get help right away if you are not doing well or get worse.   This information is not intended to replace advice given to you by your health care provider. Make sure you discuss any questions you have with your health care provider.    Follow-up with orthopedic provider as soon as possible for consultation and reevaluation. Do not bear weight on this extremity. Keep leg elevated. Apply ice to affected area. Take ibuprofen as needed for pain. Return to the emergency department if you experience severe increase in your pain, increased swelling, discoloration of your extremity, numbness or tingling in your extremity.

## 2015-01-26 NOTE — ED Provider Notes (Signed)
CSN: [id number]     Arrival date & time 01/26/15  1758 History   First MD Initiated Contact with Patient 01/26/15 1807     No chief complaint on file.    (Consider location/radiation/quality/duration/timing/severity/associated sxs/prior Treatment) HPI   [name] is a 50 y.o F who presents to the ED today c/o right ankle pain. Patient states that she was walking outside of her front door when she slipped on the ice and fell onto her right ankle. Patient had immediately swelling to the lateral and medial malleolus with severe pain. Patient took 800 mg ibuprofen with no relief. Patient was able to minimally bear weight on the ankle after the fall, limping to the car to come to the emergency department. Denies decreased sensation, discoloration.  Past Medical History  Diagnosis Date  . DIAB W/UNSPEC COMP TYPE II/UNSPEC TYPE UNCNTRL 08/24/2008  . OBESITY, NOS 03/18/2006  . HYPERTENSION, BENIGN SYSTEMIC 03/18/2006  . GASTROESOPHAGEAL REFLUX, NO ESOPHAGITIS 03/18/2006   Past Surgical History  Procedure Laterality Date  . Cholecystectomy    . Tubal ligation  2004   Family History  Problem Relation Age of Onset  . Hypertension Mother   . Diabetes Mother   . Hypertension Father    Social History  Substance Use Topics  . Smoking status: Never Smoker   . Smokeless tobacco: Not on file  . Alcohol Use: No   OB History    No data available     Review of Systems  All other systems reviewed and are negative.     Allergies  Review of patient's allergies indicates no known allergies.  Home Medications   Prior to Admission medications   Medication Sig Start Date End Date Taking? Authorizing Provider  aspirin EC 81 MG tablet Take 81 mg by mouth daily. 04/16/11 10/29/14  Lyndal Pulley, DO  cetirizine (ZYRTEC) 10 MG tablet Take 1 tablet (10 mg total) by mouth daily. 01/06/13 10/29/14  Marin Olp, MD  Exenatide ER (BYDUREON) 2 MG SRER Inject 2 mg into the skin once a week.  10/29/14   Hoyt Koch, MD  fluticasone (FLONASE) 50 MCG/ACT nasal spray Place 2 sprays into both nostrils daily. 06/26/14 04/03/16  Hoyt Koch, MD  glipiZIDE (GLUCOTROL XL) 5 MG 24 hr tablet Take 1 tablet (5 mg total) by mouth daily with breakfast. 10/29/14   Hoyt Koch, MD  guaiFENesin-codeine [facility]) 100-10 MG/5ML syrup Take 5 mLs by mouth 3 (three) times daily as needed for cough. 01/04/15   Binnie Rail, MD  lisinopril-hydrochlorothiazide (PRINZIDE,ZESTORETIC) 20-25 MG tablet TAKE 1 TABLET BY MOUTH DAILY. 01/15/15   Hoyt Koch, MD  metFORMIN (GLUCOPHAGE) 1000 MG tablet Take 1 tablet (1,000 mg total) by mouth 2 (two) times daily with a meal. 09/10/13   Olam Idler, MD  sitaGLIPtin (JANUVIA) 100 MG tablet Take 1 tablet (100 mg total) by mouth daily. 05/04/14   Hoyt Koch, MD  triamcinolone ointment (KENALOG) 0.5 % Apply topically 2 (two) times daily. 06/27/14   Lyndal Pulley, DO   BP 154/75 mmHg  Pulse 95  Temp(Src) 99.6 F (37.6 C) (Oral)  Resp 18  SpO2 100% Physical Exam  Constitutional: She is oriented to person, place, and time. She appears well-developed and well-nourished. No distress.  HENT:  Head: Normocephalic and atraumatic.  Eyes: Conjunctivae are normal. Right eye exhibits no discharge. Left eye exhibits no discharge. No scleral icterus.  Cardiovascular: Normal rate.   Pulmonary/Chest: Effort normal.  Musculoskeletal:  Moderate edema overlying the right lateral and medial malleolus with significant tenderness to palpation. No ecchymosis. Patient able to dorsi and plantarflex without difficulty. Intact distal pulses. Pain with inversion and eversion of right ankle.  Neurological: She is alert and oriented to person, place, and time. Coordination normal.  No sensory deficits.  Skin: Skin is warm and dry. No rash noted. She is not diaphoretic. No erythema. No pallor.  Psychiatric: She has a normal mood and affect. Her  behavior is normal.  Nursing note and vitals reviewed.   ED Course  Procedures (including critical care time) Labs Review Labs Reviewed - No data to display  Imaging Review Dg Ankle Complete Right  01/26/2015  CLINICAL DATA:  Fall on ice today.  Right ankle pain and swelling. EXAM: RIGHT ANKLE - COMPLETE 3+ VIEW COMPARISON:  None. FINDINGS: There is an oblique fracture of the distal fibula extending from the meta diaphysis to the metaphysis. The distal fracture component is displaced laterally by 4 mm. The talus has subluxed laterally also by 4 mm. No medial malleolar fractures seen. There is a subtle fracture of the posterior malleolus, without significant displacement. Small ill-defined bone fragments are seen anterior to the tibial articular surface on the lateral view. There is diffuse surrounding soft tissue swelling. IMPRESSION: 1. Oblique fracture of the distal fibula. Fracture is displaced laterally, and the talus is subluxed laterally, 4 mm. 2. Fracture of the posterior margin of the distal tibia, the posterior malleolus, without significant displacement. Per Electronically Signed   By: Lajean Manes M.D.   On: 01/26/2015 18:52   I have personally reviewed and evaluated these images and lab results as part of my medical decision-making.   EKG Interpretation None      MDM   Final diagnoses:  Fracture of distal end of tibia with fibula, right, closed, initial encounter    X-ray reveals oblique fracture of the distal fibula. Talus subluxation. Fracture of the posterior margin of the distal tibia, and the posterior malleolus, without significant displacement. Pain managed in ED. Patient is neurovascularly intact. Patient placed in a U-splint and crutches given. Patient will avoid bearing weight on this ankle until follow-up with orthopedic provider. Referral given. Apply ice to affected area. Will DC home with pain medication. Discussed treatment plan with patient who is agreeable.  Return precautions outlined in patient discharge instructions.    Dondra Spry [place], PA-C 01/26/15 [place], MD 01/26/15 2057

## 2015-01-28 ENCOUNTER — Other Ambulatory Visit (HOSPITAL_COMMUNITY): Payer: Self-pay | Admitting: Orthopaedic Surgery

## 2015-01-30 ENCOUNTER — Other Ambulatory Visit (HOSPITAL_COMMUNITY): Payer: Federal, State, Local not specified - PPO

## 2015-01-31 ENCOUNTER — Other Ambulatory Visit: Payer: Self-pay

## 2015-01-31 ENCOUNTER — Encounter (HOSPITAL_COMMUNITY)
Admission: RE | Admit: 2015-01-31 | Discharge: 2015-01-31 | Disposition: A | Discharge status: Home / Self Care | Payer: Federal, State, Local not specified - PPO | Source: Ambulatory Visit | Attending: Orthopaedic Surgery | Admitting: Orthopaedic Surgery

## 2015-01-31 ENCOUNTER — Encounter (HOSPITAL_COMMUNITY): Payer: Self-pay

## 2015-01-31 DIAGNOSIS — W000XXA Fall on same level due to ice and snow, initial encounter: Secondary | ICD-10-CM | POA: Diagnosis not present

## 2015-01-31 DIAGNOSIS — S8261XA Displaced fracture of lateral malleolus of right fibula, initial encounter for closed fracture: Secondary | ICD-10-CM | POA: Diagnosis not present

## 2015-01-31 DIAGNOSIS — E1165 Type 2 diabetes mellitus with hyperglycemia: Secondary | ICD-10-CM | POA: Diagnosis not present

## 2015-01-31 DIAGNOSIS — Z6841 Body Mass Index (BMI) 40.0 and over, adult: Secondary | ICD-10-CM | POA: Diagnosis not present

## 2015-01-31 DIAGNOSIS — Z79899 Other long term (current) drug therapy: Secondary | ICD-10-CM | POA: Diagnosis not present

## 2015-01-31 DIAGNOSIS — K219 Gastro-esophageal reflux disease without esophagitis: Secondary | ICD-10-CM | POA: Diagnosis not present

## 2015-01-31 DIAGNOSIS — Z7984 Long term (current) use of oral hypoglycemic drugs: Secondary | ICD-10-CM | POA: Diagnosis not present

## 2015-01-31 DIAGNOSIS — Z7951 Long term (current) use of inhaled steroids: Secondary | ICD-10-CM | POA: Diagnosis not present

## 2015-01-31 DIAGNOSIS — Z7982 Long term (current) use of aspirin: Secondary | ICD-10-CM | POA: Diagnosis not present

## 2015-01-31 DIAGNOSIS — Z793 Long term (current) use of hormonal contraceptives: Secondary | ICD-10-CM | POA: Diagnosis not present

## 2015-01-31 DIAGNOSIS — I10 Essential (primary) hypertension: Secondary | ICD-10-CM | POA: Diagnosis not present

## 2015-01-31 DIAGNOSIS — Z86718 Personal history of other venous thrombosis and embolism: Secondary | ICD-10-CM | POA: Diagnosis not present

## 2015-01-31 HISTORY — DX: Cough: R05

## 2015-01-31 HISTORY — DX: Other seasonal allergic rhinitis: J30.2

## 2015-01-31 HISTORY — DX: Pain in unspecified joint: M25.50

## 2015-01-31 HISTORY — DX: Personal history of other venous thrombosis and embolism: Z86.718

## 2015-01-31 HISTORY — DX: Cough, unspecified: R05.9

## 2015-01-31 HISTORY — DX: Other specified postprocedural states: Z98.890

## 2015-01-31 HISTORY — DX: Family history of other specified conditions: Z84.89

## 2015-01-31 HISTORY — DX: Other specified postprocedural states: R11.2

## 2015-01-31 HISTORY — DX: Effusion, unspecified joint: M25.40

## 2015-01-31 HISTORY — DX: Dermatitis, unspecified: L30.9

## 2015-01-31 LAB — BASIC METABOLIC PANEL
ANION GAP: 12 (ref 5–15)
BUN: 9 mg/dL (ref 6–20)
CHLORIDE: 98 mmol/L — AB (ref 101–111)
CO2: 26 mmol/L (ref 22–32)
Calcium: 8.8 mg/dL — ABNORMAL LOW (ref 8.9–10.3)
Creatinine, Ser: 1.08 mg/dL — ABNORMAL HIGH (ref 0.44–1.00)
GFR calc non Af Amer: >60 mL/min (ref >60)
Glucose, Bld: 183 mg/dL — ABNORMAL HIGH (ref 65–99)
Potassium: 4.4 mmol/L (ref 3.5–5.1)
SODIUM: 136 mmol/L (ref 135–145)

## 2015-01-31 LAB — CBC
HEMATOCRIT: 37.4 % (ref 36.0–46.0)
HEMOGLOBIN: 12.2 g/dL (ref 12.0–15.0)
MCH: 28.9 pg (ref 26.0–34.0)
MCHC: 32.6 g/dL (ref 30.0–36.0)
MCV: 88.6 fL (ref 78.0–100.0)
Platelets: 298 10*3/uL (ref 150–400)
RBC: 4.22 MIL/uL (ref 3.87–5.11)
RDW: 12.8 % (ref 11.5–15.5)
WBC: 9.7 10*3/uL (ref 4.0–10.5)

## 2015-01-31 LAB — SURGICAL PCR SCREEN
MRSA, PCR: NEGATIVE
STAPHYLOCOCCUS AUREUS: NEGATIVE

## 2015-01-31 LAB — GLUCOSE, CAPILLARY: Glucose-Capillary: 171 mg/dL — ABNORMAL HIGH (ref 65–99)

## 2015-01-31 LAB — HCG, SERUM, QUALITATIVE: PREG SERUM: NEGATIVE

## 2015-01-31 MED ORDER — CEFAZOLIN SODIUM-DEXTROSE 2-3 GM-% IV SOLR
2.0000 g | INTRAVENOUS | Status: AC
  Administered 2015-02-01: 2 g via INTRAVENOUS
  Filled 2015-01-31: qty 50

## 2015-01-31 NOTE — Progress Notes (Signed)
Cardiologist denies  Medical Md is Dr.Elizabeth Sharlet [place]  Echo denies  Stress test denies  Heart cath denies  EKG denies in past yr  CXR denies in past yr

## 2015-01-31 NOTE — Progress Notes (Signed)
Anesthesia Chart Review:  Pt is 50 year old female scheduled for ORIF R ankle fracture on 02/01/2015 with Dr. Kathrynn Speed.   PMH includes:  HTN, DM, DVT, GERD, post-op N/V. Never smoker. BMI 45.   Medications include: ASA, glipizide, lisinopril-hctz, metformin, sudafed.   Preoperative labs reviewed.  Glucose 183. HgbA1c pending.   EKG 01/31/15: Machine is reading sinus tachycardia (171 bpm) with frequent PVCs. Discussed with Dr. Therisa Doyne. Machine appears to be reading QRS complexes and T waves. HR on exam 96. Three EKGs done at PAT. Initial 2 EKGs today demonstrate possible inferior infarct, 3rd does not. Possible anterior infarct on all EKGs, appear similar to EKG from 2006. Septal T wave inversion has been present since 2006. T wave inversion in V3 more prominent in 3rd EKG today compared to 1st and 2nd today and EKG from 2006.   If no changes, I anticipate pt can proceed with surgery as scheduled.   Willeen Cass, FNP-BC [facility] Short Stay Surgical Center/Anesthesiology Phone: [phone] 01/31/2015 4:48 PM

## 2015-01-31 NOTE — Pre-Procedure Instructions (Signed)
KU. REPINSKI  01/31/2015      [id number] [place], [place] - [place] [id number] [place] [id number] Phone: [phone] Fax: [phone]  [place], [place] - [address] [place] [address] Phone: [phone] Fax: [phone]    Your procedure is scheduled on Fri, Jan 13 @ 2:30 PM  Report to [place] at 12:30 PM  Call this number if you have problems the morning of surgery:  [phone]   Remember:  Do not eat food or drink liquids after midnight.  Take these medicines the morning of surgery with A SIP OF WATER Zyrtec(Cetirizine),Flonase(Fluticasone),Claritin(Loratadine),and Pain Pill(if needed)              No Goody's,BC's,Aleve,Aspirin,Ibuprofen,Motrin,Advil,Fish Oil,or any Herbal Medications.    Do not wear jewelry, make-up or nail polish.  Do not wear lotions, powders, or perfumes.  You may wear deodorant.  Do not shave 48 hours prior to surgery.    Do not bring valuables to the hospital.  [facility] is not responsible for any belongings or valuables.  Contacts, dentures or bridgework may not be worn into surgery.  Leave your suitcase in the car.  After surgery it may be brought to your room.  For patients admitted to the hospital, discharge time will be determined by your treatment team.  Patients discharged the day of surgery will not be allowed to drive home.   Special instructions:  [facility] - Preparing for Surgery  Before surgery, you can play an important role.  Because skin is not sterile, your skin needs to be as free of germs as possible.  You can reduce the number of germs on you skin by washing with CHG (chlorahexidine gluconate) soap before surgery.  CHG is an antiseptic cleaner which kills germs and bonds with the skin to continue killing germs even after washing.  Please DO NOT  use if you have an allergy to CHG or antibacterial soaps.  If your skin becomes reddened/irritated stop using the CHG and inform your nurse when you arrive at Short Stay.  Do not shave (including legs and underarms) for at least 48 hours prior to the first CHG shower.  You may shave your face.  Please follow these instructions carefully:   1.  Shower with CHG Soap the night before surgery and the                                morning of Surgery.  2.  If you choose to wash your hair, wash your hair first as usual with your       normal shampoo.  3.  After you shampoo, rinse your hair and body thoroughly to remove the                      Shampoo.  4.  Use CHG as you would any other liquid soap.  You can apply chg directly       to the skin and wash gently with scrungie or a clean washcloth.  5.  Apply the CHG Soap to your body ONLY FROM THE NECK DOWN.        Do not use on open wounds or open sores.  Avoid contact with your eyes,       ears, mouth  and genitals (private parts).  Wash genitals (private parts)       with your normal soap.  6.  Wash thoroughly, paying special attention to the area where your surgery        will be performed.  7.  Thoroughly rinse your body with warm water from the neck down.  8.  DO NOT shower/wash with your normal soap after using and rinsing off       the CHG Soap.  9.  Pat yourself dry with a clean towel.            10.  Wear clean pajamas.            11.  Place clean sheets on your bed the night of your first shower and do not        sleep with pets.  Day of Surgery  Do not apply any lotions/deoderants the morning of surgery.  Please wear clean clothes to the hospital/surgery center.    Please read over the following fact sheets that you were given. Pain Booklet, Coughing and Deep Breathing, MRSA Information and Surgical Site Infection Prevention

## 2015-02-01 ENCOUNTER — Encounter (HOSPITAL_COMMUNITY)
Admission: RE | Disposition: A | Discharge status: Home Health | Payer: Self-pay | Source: Ambulatory Visit | Attending: Orthopaedic Surgery

## 2015-02-01 ENCOUNTER — Observation Stay (HOSPITAL_COMMUNITY): Payer: Federal, State, Local not specified - PPO

## 2015-02-01 ENCOUNTER — Ambulatory Visit (HOSPITAL_COMMUNITY): Payer: Federal, State, Local not specified - PPO | Admitting: Certified Registered Nurse Anesthetist

## 2015-02-01 ENCOUNTER — Encounter (HOSPITAL_COMMUNITY): Payer: Self-pay | Admitting: General Practice

## 2015-02-01 ENCOUNTER — Observation Stay (HOSPITAL_COMMUNITY)
Admission: RE | Admit: 2015-02-01 | Discharge: 2015-02-02 | Disposition: A | Discharge status: Home Health | Payer: Federal, State, Local not specified - PPO | Source: Ambulatory Visit | Attending: Orthopaedic Surgery | Admitting: Orthopaedic Surgery

## 2015-02-01 ENCOUNTER — Ambulatory Visit (HOSPITAL_COMMUNITY): Payer: Federal, State, Local not specified - PPO | Admitting: Vascular Surgery

## 2015-02-01 DIAGNOSIS — Z793 Long term (current) use of hormonal contraceptives: Secondary | ICD-10-CM | POA: Insufficient documentation

## 2015-02-01 DIAGNOSIS — W000XXA Fall on same level due to ice and snow, initial encounter: Secondary | ICD-10-CM | POA: Insufficient documentation

## 2015-02-01 DIAGNOSIS — Z7984 Long term (current) use of oral hypoglycemic drugs: Secondary | ICD-10-CM | POA: Insufficient documentation

## 2015-02-01 DIAGNOSIS — Z7951 Long term (current) use of inhaled steroids: Secondary | ICD-10-CM | POA: Insufficient documentation

## 2015-02-01 DIAGNOSIS — Z6841 Body Mass Index (BMI) 40.0 and over, adult: Secondary | ICD-10-CM | POA: Insufficient documentation

## 2015-02-01 DIAGNOSIS — Z7982 Long term (current) use of aspirin: Secondary | ICD-10-CM | POA: Insufficient documentation

## 2015-02-01 DIAGNOSIS — I10 Essential (primary) hypertension: Secondary | ICD-10-CM | POA: Insufficient documentation

## 2015-02-01 DIAGNOSIS — E1165 Type 2 diabetes mellitus with hyperglycemia: Secondary | ICD-10-CM | POA: Insufficient documentation

## 2015-02-01 DIAGNOSIS — Z79899 Other long term (current) drug therapy: Secondary | ICD-10-CM | POA: Insufficient documentation

## 2015-02-01 DIAGNOSIS — K219 Gastro-esophageal reflux disease without esophagitis: Secondary | ICD-10-CM | POA: Insufficient documentation

## 2015-02-01 DIAGNOSIS — Z09 Encounter for follow-up examination after completed treatment for conditions other than malignant neoplasm: Secondary | ICD-10-CM

## 2015-02-01 DIAGNOSIS — Z86718 Personal history of other venous thrombosis and embolism: Secondary | ICD-10-CM | POA: Insufficient documentation

## 2015-02-01 DIAGNOSIS — S8261XA Displaced fracture of lateral malleolus of right fibula, initial encounter for closed fracture: Secondary | ICD-10-CM | POA: Diagnosis not present

## 2015-02-01 DIAGNOSIS — S8263XA Displaced fracture of lateral malleolus of unspecified fibula, initial encounter for closed fracture: Secondary | ICD-10-CM | POA: Diagnosis present

## 2015-02-01 HISTORY — PX: ORIF ANKLE FRACTURE: SHX5408

## 2015-02-01 LAB — HEMOGLOBIN A1C
Hgb A1c MFr Bld: 7.9 % — ABNORMAL HIGH (ref 4.8–5.6)
MEAN PLASMA GLUCOSE: 180 mg/dL

## 2015-02-01 LAB — GLUCOSE, CAPILLARY
GLUCOSE-CAPILLARY: 108 mg/dL — AB (ref 65–99)
GLUCOSE-CAPILLARY: 181 mg/dL — AB (ref 65–99)
Glucose-Capillary: 90 mg/dL (ref 65–99)

## 2015-02-01 SURGERY — OPEN REDUCTION INTERNAL FIXATION (ORIF) ANKLE FRACTURE
Anesthesia: General | Site: Ankle | Laterality: Right

## 2015-02-01 MED ORDER — LISINOPRIL-HYDROCHLOROTHIAZIDE 20-25 MG PO TABS: 1.0000 | ORAL_TABLET | Freq: Every day | ORAL | Status: DC

## 2015-02-01 MED ORDER — PROPOFOL 10 MG/ML IV BOLUS
INTRAVENOUS | Status: DC | PRN
  Administered 2015-02-01: 200 mg via INTRAVENOUS

## 2015-02-01 MED ORDER — OXYCODONE HCL 5 MG PO TABS
ORAL_TABLET | ORAL | Status: AC
  Administered 2015-02-01: 10 mg via ORAL
  Filled 2015-02-01: qty 2

## 2015-02-01 MED ORDER — ONDANSETRON HCL 4 MG/2ML IJ SOLN
INTRAMUSCULAR | Status: AC
  Filled 2015-02-01: qty 4

## 2015-02-01 MED ORDER — ACETAMINOPHEN 325 MG PO TABS: 650.0000 mg | ORAL_TABLET | Freq: Four times a day (QID) | ORAL | Status: DC | PRN

## 2015-02-01 MED ORDER — VECURONIUM BROMIDE 10 MG IV SOLR
INTRAVENOUS | Status: AC
  Filled 2015-02-01: qty 10

## 2015-02-01 MED ORDER — METOCLOPRAMIDE HCL 5 MG/ML IJ SOLN: 5.0000 mg | Freq: Three times a day (TID) | INTRAMUSCULAR | Status: DC | PRN

## 2015-02-01 MED ORDER — INSULIN ASPART 100 UNIT/ML ~~LOC~~ SOLN: 0.0000 [IU] | Freq: Every day | SUBCUTANEOUS | Status: DC

## 2015-02-01 MED ORDER — INSULIN ASPART 100 UNIT/ML ~~LOC~~ SOLN
0.0000 [IU] | Freq: Three times a day (TID) | SUBCUTANEOUS | Status: DC
  Administered 2015-02-02 (×2): 3 [IU] via SUBCUTANEOUS

## 2015-02-01 MED ORDER — LORATADINE 10 MG PO TABS
10.0000 mg | ORAL_TABLET | Freq: Every day | ORAL | Status: DC
  Administered 2015-02-02: 10 mg via ORAL
  Filled 2015-02-01: qty 1

## 2015-02-01 MED ORDER — METHOCARBAMOL 500 MG PO TABS
500.0000 mg | ORAL_TABLET | Freq: Four times a day (QID) | ORAL | Status: DC | PRN
  Administered 2015-02-01 – 2015-02-02 (×4): 500 mg via ORAL
  Filled 2015-02-01 (×3): qty 1

## 2015-02-01 MED ORDER — PROMETHAZINE HCL 25 MG/ML IJ SOLN: 6.2500 mg | INTRAMUSCULAR | Status: DC | PRN

## 2015-02-01 MED ORDER — HYDROMORPHONE HCL 1 MG/ML IJ SOLN
1.0000 mg | INTRAMUSCULAR | Status: DC | PRN
  Administered 2015-02-01 – 2015-02-02 (×8): 1 mg via INTRAVENOUS
  Filled 2015-02-01 (×8): qty 1

## 2015-02-01 MED ORDER — GLYCOPYRROLATE 0.2 MG/ML IJ SOLN
INTRAMUSCULAR | Status: AC
  Filled 2015-02-01: qty 2

## 2015-02-01 MED ORDER — LACTATED RINGERS IV SOLN
INTRAVENOUS | Status: DC
  Administered 2015-02-01: 13:00:00 via INTRAVENOUS

## 2015-02-01 MED ORDER — ONDANSETRON HCL 4 MG/2ML IJ SOLN
INTRAMUSCULAR | Status: DC | PRN
  Administered 2015-02-01: 4 mg via INTRAVENOUS

## 2015-02-01 MED ORDER — LIDOCAINE HCL (CARDIAC) 20 MG/ML IV SOLN
INTRAVENOUS | Status: AC
  Filled 2015-02-01: qty 15

## 2015-02-01 MED ORDER — HYDROMORPHONE HCL 1 MG/ML IJ SOLN
0.2500 mg | INTRAMUSCULAR | Status: DC | PRN
  Administered 2015-02-01 (×3): 0.5 mg via INTRAVENOUS

## 2015-02-01 MED ORDER — FENTANYL CITRATE (PF) 100 MCG/2ML IJ SOLN
INTRAMUSCULAR | Status: DC | PRN
  Administered 2015-02-01 (×5): 50 ug via INTRAVENOUS

## 2015-02-01 MED ORDER — ONDANSETRON HCL 4 MG/2ML IJ SOLN: 4.0000 mg | Freq: Four times a day (QID) | INTRAMUSCULAR | Status: DC | PRN

## 2015-02-01 MED ORDER — METHOCARBAMOL 1000 MG/10ML IJ SOLN: 500.0000 mg | Freq: Four times a day (QID) | INTRAVENOUS | Status: DC | PRN

## 2015-02-01 MED ORDER — SODIUM CHLORIDE 0.9 % IV SOLN
INTRAVENOUS | Status: DC
  Administered 2015-02-02: 03:00:00 via INTRAVENOUS

## 2015-02-01 MED ORDER — HYDROCHLOROTHIAZIDE 25 MG PO TABS
25.0000 mg | ORAL_TABLET | Freq: Every day | ORAL | Status: DC
  Administered 2015-02-01 – 2015-02-02 (×2): 25 mg via ORAL
  Filled 2015-02-01 (×2): qty 1

## 2015-02-01 MED ORDER — BUPIVACAINE HCL (PF) 0.25 % IJ SOLN
INTRAMUSCULAR | Status: AC
  Filled 2015-02-01: qty 30

## 2015-02-01 MED ORDER — METFORMIN HCL 500 MG PO TABS
1000.0000 mg | ORAL_TABLET | Freq: Two times a day (BID) | ORAL | Status: DC
  Administered 2015-02-02: 1000 mg via ORAL
  Filled 2015-02-01: qty 2

## 2015-02-01 MED ORDER — 0.9 % SODIUM CHLORIDE (POUR BTL) OPTIME
TOPICAL | Status: DC | PRN
  Administered 2015-02-01: 10 mL

## 2015-02-01 MED ORDER — CEFAZOLIN SODIUM 1-5 GM-% IV SOLN
1.0000 g | Freq: Four times a day (QID) | INTRAVENOUS | Status: AC
  Administered 2015-02-01 – 2015-02-02 (×3): 1 g via INTRAVENOUS
  Filled 2015-02-01 (×3): qty 50

## 2015-02-01 MED ORDER — DIPHENHYDRAMINE HCL 12.5 MG/5ML PO ELIX: 12.5000 mg | ORAL_SOLUTION | ORAL | Status: DC | PRN

## 2015-02-01 MED ORDER — METOCLOPRAMIDE HCL 5 MG PO TABS: 5.0000 mg | ORAL_TABLET | Freq: Three times a day (TID) | ORAL | Status: DC | PRN

## 2015-02-01 MED ORDER — LISINOPRIL 20 MG PO TABS
20.0000 mg | ORAL_TABLET | Freq: Every day | ORAL | Status: DC
  Administered 2015-02-01 – 2015-02-02 (×2): 20 mg via ORAL
  Filled 2015-02-01 (×2): qty 1

## 2015-02-01 MED ORDER — ASPIRIN 325 MG PO TABS
325.0000 mg | ORAL_TABLET | Freq: Two times a day (BID) | ORAL | Status: DC
  Administered 2015-02-01 – 2015-02-02 (×2): 325 mg via ORAL
  Filled 2015-02-01 (×2): qty 1

## 2015-02-01 MED ORDER — FLUTICASONE PROPIONATE 50 MCG/ACT NA SUSP
2.0000 | Freq: Every day | NASAL | Status: DC
  Administered 2015-02-02: 2 via NASAL
  Filled 2015-02-01: qty 16

## 2015-02-01 MED ORDER — OXYCODONE HCL 5 MG PO TABS
5.0000 mg | ORAL_TABLET | ORAL | Status: DC | PRN
  Administered 2015-02-01 – 2015-02-02 (×5): 10 mg via ORAL
  Filled 2015-02-01 (×4): qty 2

## 2015-02-01 MED ORDER — ESMOLOL HCL 100 MG/10ML IV SOLN
INTRAVENOUS | Status: DC | PRN
  Administered 2015-02-01: 10 mg via INTRAVENOUS

## 2015-02-01 MED ORDER — VITAMIN C 500 MG PO TABS
500.0000 mg | ORAL_TABLET | Freq: Every day | ORAL | Status: DC
  Administered 2015-02-02: 500 mg via ORAL
  Filled 2015-02-01: qty 1

## 2015-02-01 MED ORDER — ZOLPIDEM TARTRATE 5 MG PO TABS: 5.0000 mg | ORAL_TABLET | Freq: Every evening | ORAL | Status: DC | PRN

## 2015-02-01 MED ORDER — BIOTIN 1000 MCG PO TABS: 1000.0000 ug | ORAL_TABLET | Freq: Every day | ORAL | Status: DC

## 2015-02-01 MED ORDER — PHENYLEPHRINE HCL 10 MG/ML IJ SOLN
INTRAMUSCULAR | Status: AC
  Filled 2015-02-01: qty 1

## 2015-02-01 MED ORDER — GLIPIZIDE ER 5 MG PO TB24
5.0000 mg | ORAL_TABLET | Freq: Every day | ORAL | Status: DC
  Administered 2015-02-02: 5 mg via ORAL
  Filled 2015-02-01 (×2): qty 1

## 2015-02-01 MED ORDER — ONDANSETRON HCL 4 MG PO TABS: 4.0000 mg | ORAL_TABLET | Freq: Four times a day (QID) | ORAL | Status: DC | PRN

## 2015-02-01 MED ORDER — MIDAZOLAM HCL 5 MG/5ML IJ SOLN
INTRAMUSCULAR | Status: DC | PRN
  Administered 2015-02-01: 2 mg via INTRAVENOUS

## 2015-02-01 MED ORDER — FENTANYL CITRATE (PF) 250 MCG/5ML IJ SOLN
INTRAMUSCULAR | Status: AC
  Filled 2015-02-01: qty 5

## 2015-02-01 MED ORDER — GUAIFENESIN 100 MG/5ML PO SOLN: 200.0000 mg | Freq: Three times a day (TID) | ORAL | Status: DC | PRN

## 2015-02-01 MED ORDER — ACETAMINOPHEN 650 MG RE SUPP: 650.0000 mg | Freq: Four times a day (QID) | RECTAL | Status: DC | PRN

## 2015-02-01 MED ORDER — SUCCINYLCHOLINE CHLORIDE 20 MG/ML IJ SOLN
INTRAMUSCULAR | Status: DC | PRN
  Administered 2015-02-01: 140 mg via INTRAVENOUS

## 2015-02-01 MED ORDER — SUCCINYLCHOLINE CHLORIDE 20 MG/ML IJ SOLN
INTRAMUSCULAR | Status: AC
  Filled 2015-02-01: qty 2

## 2015-02-01 MED ORDER — METHOCARBAMOL 500 MG PO TABS
ORAL_TABLET | ORAL | Status: AC
  Administered 2015-02-01: 500 mg via ORAL
  Filled 2015-02-01: qty 1

## 2015-02-01 MED ORDER — MIDAZOLAM HCL 2 MG/2ML IJ SOLN
INTRAMUSCULAR | Status: AC
  Filled 2015-02-01: qty 2

## 2015-02-01 MED ORDER — ROCURONIUM BROMIDE 50 MG/5ML IV SOLN
INTRAVENOUS | Status: AC
  Filled 2015-02-01: qty 2

## 2015-02-01 MED ORDER — HYDROMORPHONE HCL 1 MG/ML IJ SOLN
INTRAMUSCULAR | Status: AC
  Filled 2015-02-01: qty 1

## 2015-02-01 MED ORDER — PROPOFOL 10 MG/ML IV BOLUS
INTRAVENOUS | Status: AC
  Filled 2015-02-01: qty 20

## 2015-02-01 MED ORDER — LIDOCAINE HCL (CARDIAC) 20 MG/ML IV SOLN
INTRAVENOUS | Status: DC | PRN
  Administered 2015-02-01: 50 mg via INTRAVENOUS

## 2015-02-01 MED ORDER — ETONOGESTREL 68 MG ~~LOC~~ IMPL: 1.0000 | DRUG_IMPLANT | Freq: Once | SUBCUTANEOUS | Status: DC

## 2015-02-01 MED ORDER — HYDROMORPHONE HCL 1 MG/ML IJ SOLN
INTRAMUSCULAR | Status: AC
  Administered 2015-02-01: 0.5 mg via INTRAVENOUS
  Filled 2015-02-01: qty 1

## 2015-02-01 SURGICAL SUPPLY — 58 items
BANDAGE ACE 4X5 VEL STRL LF (GAUZE/BANDAGES/DRESSINGS) ×2 IMPLANT
BANDAGE ACE 6X5 VEL STRL LF (GAUZE/BANDAGES/DRESSINGS) ×2 IMPLANT
BANDAGE ELASTIC 4 VELCRO ST LF (GAUZE/BANDAGES/DRESSINGS) IMPLANT
BANDAGE ELASTIC 6 VELCRO ST LF (GAUZE/BANDAGES/DRESSINGS) IMPLANT
BANDAGE ESMARK 6X9 LF (GAUZE/BANDAGES/DRESSINGS) IMPLANT
BNDG ESMARK 6X9 LF (GAUZE/BANDAGES/DRESSINGS)
COVER SURGICAL LIGHT HANDLE (MISCELLANEOUS) ×2 IMPLANT
CUFF TOURNIQUET SINGLE 34IN LL (TOURNIQUET CUFF) IMPLANT
CUFF TOURNIQUET SINGLE 44IN (TOURNIQUET CUFF) IMPLANT
DRAPE C-ARM 42X72 X-RAY (DRAPES) ×2 IMPLANT
DRAPE U-SHAPE 47X51 STRL (DRAPES) ×2 IMPLANT
DRILL 2.6X122MM WL AO SHAFT (BIT) ×2 IMPLANT
DURAPREP 26ML APPLICATOR (WOUND CARE) ×2 IMPLANT
ELECT REM PT RETURN 9FT ADLT (ELECTROSURGICAL) ×2
ELECTRODE REM PT RTRN 9FT ADLT (ELECTROSURGICAL) ×1 IMPLANT
GAUZE SPONGE 4X4 12PLY STRL (GAUZE/BANDAGES/DRESSINGS) ×2 IMPLANT
GAUZE XEROFORM 5X9 LF (GAUZE/BANDAGES/DRESSINGS) ×2 IMPLANT
GLOVE BIO SURGEON STRL SZ8 (GLOVE) ×2 IMPLANT
GLOVE BIOGEL PI IND STRL 6.5 (GLOVE) ×2 IMPLANT
GLOVE BIOGEL PI IND STRL 8 (GLOVE) ×3 IMPLANT
GLOVE BIOGEL PI INDICATOR 6.5 (GLOVE) ×2
GLOVE BIOGEL PI INDICATOR 8 (GLOVE) ×3
GLOVE ECLIPSE 6.5 STRL STRAW (GLOVE) ×4 IMPLANT
GLOVE ORTHO TXT STRL SZ7.5 (GLOVE) ×2 IMPLANT
GOWN STRL REUS W/ TWL LRG LVL3 (GOWN DISPOSABLE) ×2 IMPLANT
GOWN STRL REUS W/ TWL XL LVL3 (GOWN DISPOSABLE) ×4 IMPLANT
GOWN STRL REUS W/TWL LRG LVL3 (GOWN DISPOSABLE) ×2
GOWN STRL REUS W/TWL XL LVL3 (GOWN DISPOSABLE) ×4
KIT BASIN OR (CUSTOM PROCEDURE TRAY) ×2 IMPLANT
KIT ROOM TURNOVER OR (KITS) ×2 IMPLANT
MANIFOLD NEPTUNE II (INSTRUMENTS) ×2 IMPLANT
NEEDLE HYPO 25GX1X1/2 BEV (NEEDLE) ×2 IMPLANT
NS IRRIG 1000ML POUR BTL (IV SOLUTION) ×2 IMPLANT
PACK ORTHO EXTREMITY (CUSTOM PROCEDURE TRAY) ×2 IMPLANT
PAD ABD 8X10 STRL (GAUZE/BANDAGES/DRESSINGS) ×2 IMPLANT
PAD ARMBOARD 7.5X6 YLW CONV (MISCELLANEOUS) ×4 IMPLANT
PAD CAST 4YDX4 CTTN HI CHSV (CAST SUPPLIES) ×1 IMPLANT
PADDING CAST COTTON 4X4 STRL (CAST SUPPLIES) ×1
PADDING CAST COTTON 6X4 STRL (CAST SUPPLIES) ×2 IMPLANT
PLATE DISTAL FIBULA 3HOLE (Plate) ×2 IMPLANT
PREFILTER NEPTUNE (MISCELLANEOUS) ×2 IMPLANT
SCREW BONE 14MMX3.5MM (Screw) ×4 IMPLANT
SCREW BONE NON-LCKING 3.5X12MM (Screw) ×4 IMPLANT
SCREW LOCK 3.5X10MM (Screw) ×6 IMPLANT
SCREW LOCK 3.5X14 (Screw) ×2 IMPLANT
SCREW LOCKING 3.5X12 (Screw) ×2 IMPLANT
SPONGE GAUZE 4X4 12PLY STER LF (GAUZE/BANDAGES/DRESSINGS) ×2 IMPLANT
SPONGE LAP 4X18 X RAY DECT (DISPOSABLE) ×4 IMPLANT
SUCTION FRAZIER TIP 10 FR DISP (SUCTIONS) ×2 IMPLANT
SUT ETHILON 2 0 FS 18 (SUTURE) ×4 IMPLANT
SUT VIC AB 2-0 CT1 27 (SUTURE) ×1
SUT VIC AB 2-0 CT1 27XBRD (SUTURE) ×1 IMPLANT
SUT VICRYL 0 CT 1 36IN (SUTURE) ×2 IMPLANT
SYR CONTROL 10ML LL (SYRINGE) ×2 IMPLANT
TOWEL OR 17X24 6PK STRL BLUE (TOWEL DISPOSABLE) ×2 IMPLANT
TOWEL OR 17X26 10 PK STRL BLUE (TOWEL DISPOSABLE) ×2 IMPLANT
TUBE CONNECTING 12X1/4 (SUCTIONS) ×2 IMPLANT
WATER STERILE IRR 1000ML POUR (IV SOLUTION) ×2 IMPLANT

## 2015-02-01 NOTE — Brief Op Note (Signed)
02/01/2015  2:37 PM  PATIENT:  [name]  50 y.o. female  PRE-OPERATIVE DIAGNOSIS:  RIGHT UNSTABLE LATERAL MALLEOLUS FRACTURE  POST-OPERATIVE DIAGNOSIS:  right unstable lateral mallleolus fracture  PROCEDURE:  Procedure(s): OPEN REDUCTION INTERNAL FIXATION RIGHT ANKLE FRACTURE (Right)  SURGEON:  Surgeon(s) and Role:    * Mcarthur Rossetti, MD - Primary  PHYSICIAN ASSISTANT: Benita Stabile, PA-C  ANESTHESIA:   general  EBL:    < 50 cc  COUNTS:  YES  TOURNIQUET:  * Missing tourniquet times found for documented tourniquets in log:  [id number] *  DICTATION: .Other Dictation: Dictation Number [id number]  PLAN OF CARE: Admit for overnight observation  PATIENT DISPOSITION:  PACU - hemodynamically stable.   Delay start of Pharmacological VTE agent (>24hrs) due to surgical blood loss or risk of bleeding: no

## 2015-02-01 NOTE — H&P (Signed)
[name] is an 50 y.o. female.   Chief Complaint: right ankle pain; known fracture HPI:   50 yo female who slipped accidentally on ice on 01/26/15 injuring her right ankle.  Was seen at ED and found to have an unstable lateral malleolus fracture and given follow-up with Ortho.  Presents now for definitive surgery.  Past Medical History  Diagnosis Date  . OBESITY, NOS 03/18/2006  . Seasonal allergies     takes Zyrtec and Claritin daily as well as Fluticasone  . HYPERTENSION, BENIGN SYSTEMIC 03/18/2006    takes Lisinopril-HCTZ daily  . DIAB W/UNSPEC COMP TYPE II/UNSPEC TYPE UNCNTRL 08/24/2008    takes Metformin,CInnamon,and Glipizide daily  . PONV (postoperative nausea and vomiting)   . Family history of adverse reaction to anesthesia     dad slow to wake up  . History of blood clots 8+ yrs ago    right calf  . Cough     taking Sudafed as needed.Iophen C as needed also for cough.No fever  . Joint pain   . Joint swelling   . Eczema   . GASTROESOPHAGEAL REFLUX, NO ESOPHAGITIS 03/18/2006    doesn't take any meds    Past Surgical History  Procedure Laterality Date  . Cholecystectomy    . Tubal ligation  2004  . Ablation      Family History  Problem Relation Age of Onset  . Hypertension Mother   . Diabetes Mother   . Hypertension Father    Social History:  reports that she has never smoked. She does not have any smokeless tobacco history on file. She reports that she does not drink alcohol or use illicit drugs.  Allergies: No Known Allergies  Medications Prior to Admission  Medication Sig Dispense Refill  . aspirin 325 MG tablet Take 325 mg by mouth 2 (two) times daily.     . Biotin 1000 MCG tablet Take 1,000 mcg by mouth daily.    . cetirizine (ZYRTEC) 10 MG tablet Take 1 tablet (10 mg total) by mouth daily. (Patient taking differently: Take 10 mg by mouth at bedtime. ) 30 tablet 11  . Cinnamon 500 MG capsule Take 500 mg by mouth daily.    Marland Kitchen etonogestrel (IMPLANON) 68  MG IMPL implant 1 each by Subdermal route once.    . fluticasone (FLONASE) 50 MCG/ACT nasal spray Place 2 sprays into both nostrils daily. 32 g 5  . glipiZIDE (GLUCOTROL XL) 5 MG 24 hr tablet Take 1 tablet (5 mg total) by mouth daily with breakfast. 30 tablet 6  . guaiFENesin (ROBITUSSIN) 100 MG/5ML liquid Take 200 mg by mouth 3 (three) times daily as needed for cough.    Marland Kitchen ibuprofen (ADVIL,MOTRIN) 200 MG tablet Take 400-800 mg by mouth every 6 (six) hours as needed for mild pain or moderate pain.    Marland Kitchen lisinopril-hydrochlorothiazide (PRINZIDE,ZESTORETIC) 20-25 MG tablet TAKE 1 TABLET BY MOUTH DAILY. 90 tablet 1  . loratadine (CLARITIN) 10 MG tablet Take 10 mg by mouth daily.    . metFORMIN (GLUCOPHAGE) 1000 MG tablet Take 1 tablet (1,000 mg total) by mouth 2 (two) times daily with a meal. 180 tablet 1  . oxyCODONE-acetaminophen (PERCOCET/ROXICET) 5-325 MG tablet Take 2 tablets by mouth every 4 (four) hours as needed for severe pain. 10 tablet 0  . pseudoephedrine (SUDAFED) 120 MG 12 hr tablet Take 120 mg by mouth 2 (two) times daily.    Marland Kitchen triamcinolone ointment (KENALOG) 0.5 % Apply topically 2 (two) times daily. [place]  g 0  . vitamin C (ASCORBIC ACID) 500 MG tablet Take 500 mg by mouth daily.      Results for orders placed or performed during the hospital encounter of 02/01/15 (from the past 48 hour(s))  Glucose, capillary     Status: Abnormal   Collection Time: 02/01/15 12:30 PM  Result Value Ref Range   Glucose-Capillary 108 (H) 65 - 99 mg/dL   No results found.  Review of Systems  All other systems reviewed and are negative.   Blood pressure 121/58, pulse 96, temperature 98.1 F (36.7 C), temperature source Oral, height 5\' 1"  (1.549 m), weight 107.502 kg (237 lb), SpO2 100 %. Physical Exam  Constitutional: She is oriented to person, place, and time. She appears well-developed and well-nourished.  HENT:  Head: Normocephalic and atraumatic.  Eyes: EOM are normal. Pupils are equal,  round, and reactive to light.  Neck: Normal range of motion. Neck supple.  Cardiovascular: Normal rate and regular rhythm.   Respiratory: Effort normal and breath sounds normal.  GI: Soft. Bowel sounds are normal.  Musculoskeletal:       Right ankle: She exhibits decreased range of motion, swelling and ecchymosis. Tenderness. Lateral malleolus and medial malleolus tenderness found.  Neurological: She is alert and oriented to person, place, and time.  Skin: Skin is warm and dry.  Psychiatric: She has a normal mood and affect.     Assessment/Plan Right ankle with unstable closed lateral malleolus fracture and widening of the ankle mortise. 1)  To the OR today for open reduction/internal fixation of this unstable ankle fracture.  Risks and benefits have been explained in detail and informed consent is obtained. 2)  Admission overnight for pain meds, antibiotics, PT  [name] Y 02/01/2015, 1:17 PM

## 2015-02-01 NOTE — Anesthesia Postprocedure Evaluation (Signed)
Anesthesia Post Note  Patient: [name]  Procedure(s) Performed: Procedure(s) (LRB): OPEN REDUCTION INTERNAL FIXATION RIGHT ANKLE FRACTURE (Right)  Patient location during evaluation: PACU Anesthesia Type: General Level of consciousness: awake, sedated, oriented and patient cooperative Pain management: pain level not controlled Vital Signs Assessment: post-procedure vital signs reviewed and stable Respiratory status: respiratory function stable and spontaneous breathing Cardiovascular status: blood pressure returned to baseline Anesthetic complications: no    Last Vitals:  Filed Vitals:   02/01/15 1545 02/01/15 1547  BP: 120/84   Pulse: 99 99  Temp:    Resp: 17 19    Last Pain:  Filed Vitals:   02/01/15 1548  PainSc: 8                  Dafina Suk EDWARD

## 2015-02-01 NOTE — Anesthesia Procedure Notes (Signed)
Procedure Name: Intubation Date/Time: 02/01/2015 1:55 PM Performed by: Shirlyn Goltz Pre-anesthesia Checklist: Patient identified, Emergency Drugs available, Suction available and Patient being monitored Patient Re-evaluated:Patient Re-evaluated prior to inductionOxygen Delivery Method: Circle system utilized Preoxygenation: Pre-oxygenation with 100% oxygen Intubation Type: IV induction Ventilation: Mask ventilation without difficulty Laryngoscope Size: Mac and 3 Grade View: Grade III Tube type: Oral Tube size: 7.0 mm Number of attempts: 1 Airway Equipment and Method: Stylet Placement Confirmation: ETT inserted through vocal cords under direct vision,  positive ETCO2 and breath sounds checked- equal and bilateral Secured at: 21 cm Tube secured with: Tape Dental Injury: Teeth and Oropharynx as per pre-operative assessment

## 2015-02-01 NOTE — Transfer of Care (Signed)
Immediate Anesthesia Transfer of Care Note  Patient: [name]  Procedure(s) Performed: Procedure(s): OPEN REDUCTION INTERNAL FIXATION RIGHT ANKLE FRACTURE (Right)  Patient Location: PACU  Anesthesia Type:General  Level of Consciousness: awake and patient cooperative  Airway & Oxygen Therapy: Patient Spontanous Breathing and Patient connected to face mask oxygen  Post-op Assessment: Report given to RN, Post -op Vital signs reviewed and stable and Patient moving all extremities  Post vital signs: Reviewed and stable  Last Vitals:  Filed Vitals:   02/01/15 1514 02/01/15 1515  BP: 106/48   Pulse: 102 101  Temp: 36.8 C   Resp: 20 14    Complications: No apparent anesthesia complications

## 2015-02-01 NOTE — Anesthesia Preprocedure Evaluation (Signed)
Anesthesia Evaluation  Patient identified by MRN, date of birth, ID band Patient awake    Reviewed: Allergy & Precautions, NPO status , Patient's Chart, lab work & pertinent test results  Airway Mallampati: II  TM Distance: >3 FB Neck ROM: Full    Dental no notable dental hx.    Pulmonary neg pulmonary ROS,    Pulmonary exam normal breath sounds clear to auscultation       Cardiovascular hypertension, Pt. on medications Normal cardiovascular exam Rhythm:Regular Rate:Normal     Neuro/Psych negative neurological ROS  negative psych ROS   GI/Hepatic Neg liver ROS, GERD  ,  Endo/Other  diabetesMorbid obesity  Renal/GU negative Renal ROS  negative genitourinary   Musculoskeletal negative musculoskeletal ROS (+)   Abdominal   Peds negative pediatric ROS (+)  Hematology negative hematology ROS (+)   Anesthesia Other Findings   Reproductive/Obstetrics negative OB ROS                             Anesthesia Physical Anesthesia Plan  ASA: III  Anesthesia Plan: General   Post-op Pain Management:    Induction: Intravenous  Airway Management Planned: Oral ETT  Additional Equipment:   Intra-op Plan:   Post-operative Plan: Extubation in OR  Informed Consent: I have reviewed the patients History and Physical, chart, labs and discussed the procedure including the risks, benefits and alternatives for the proposed anesthesia with the patient or authorized representative who has indicated his/her understanding and acceptance.   Dental advisory given  Plan Discussed with: CRNA and Surgeon  Anesthesia Plan Comments:         Anesthesia Quick Evaluation

## 2015-02-02 DIAGNOSIS — S8261XA Displaced fracture of lateral malleolus of right fibula, initial encounter for closed fracture: Secondary | ICD-10-CM | POA: Diagnosis not present

## 2015-02-02 LAB — GLUCOSE, CAPILLARY
Glucose-Capillary: 179 mg/dL — ABNORMAL HIGH (ref 65–99)
Glucose-Capillary: 198 mg/dL — ABNORMAL HIGH (ref 65–99)

## 2015-02-02 MED ORDER — OXYCODONE-ACETAMINOPHEN 5-325 MG PO TABS: 1.0000 | ORAL_TABLET | ORAL | Status: DC | PRN

## 2015-02-02 NOTE — Progress Notes (Signed)
Subjective: 1 Day Post-Op Procedure(s) (LRB): OPEN REDUCTION INTERNAL FIXATION RIGHT ANKLE FRACTURE (Right) Patient reports pain as moderate.    Objective: Vital signs in last 24 hours: Temp:  [98.1 F (36.7 C)-98.9 F (37.2 C)] 98.9 F (37.2 C) (01/14 0432) Pulse Rate:  [95-111] 106 (01/14 0432) Resp:  [14-24] 18 (01/14 0432) BP: (89-122)/(48-84) 111/72 mmHg (01/14 0907) SpO2:  [97 %-100 %] 100 % (01/14 0432) Weight:  [107.502 kg (237 lb)] 107.502 kg (237 lb) (01/13 1226)  Intake/Output from previous day: 01/13 0701 - 01/14 0700 In: 1475 [I.V.:1475] Out: 75 [Blood:75] Intake/Output this shift: Total I/O In: 240 [P.O.:240] Out: -    Recent Labs  01/31/15 1549  HGB 12.2    Recent Labs  01/31/15 1549  WBC 9.7  RBC 4.22  HCT 37.4  PLT 298    Recent Labs  01/31/15 1549  NA 136  K 4.4  CL 98*  CO2 26  BUN 9  CREATININE 1.08*  GLUCOSE 183*  CALCIUM 8.8*   No results for input(s): LABPT, INR in the last 72 hours.  Sensation intact distally Intact pulses distally Dorsiflexion/Plantar flexion intact Incision: dressing C/D/I Compartment soft  Assessment/Plan: 1 Day Post-Op Procedure(s) (LRB): OPEN REDUCTION INTERNAL FIXATION RIGHT ANKLE FRACTURE (Right) Up with therapy Discharge home with home health today  Mcarthur Rossetti 02/02/2015, 10:30 AM

## 2015-02-02 NOTE — Care Management Note (Addendum)
Case Management Note  Patient Details  Name: BOSTYN BRICH MRN: [id number] Date of Birth: [birth year]  Subjective/Objective:   50 yr old female s/p fall with right ankle fracture. Patient underwent a right ankle ORIF.                 Action/Plan:  Case manager spoke with patient and family at the bedside concerning home health needs at discharge. Choice was offered. Case manager placed call to [facility] with referral. Spoke with Jonelle Sidle, [facility] Transition to home Specialist.   Expected Discharge Date:    02/02/15              Expected Discharge Plan:  [place]  In-House Referral:     Discharge planning Services  CM Consult  Post Acute Care Choice:  Home Health Choice offered to:  Patient  DME Arranged:    DME Agency:     HH Arranged:  PT [place]:  [facility]  Status of Service:  In process, will continue to follow  Medicare Important Message Given:    Date Medicare IM Given:    Medicare IM give by:    Date Additional Medicare IM Given:    Additional Medicare Important Message give by:     If discussed at [place] of Stay Meetings, dates discussed:    Additional Comments:  Ninfa Meeker, RN 02/02/2015, 11:58 AM

## 2015-02-02 NOTE — Evaluation (Addendum)
Physical Therapy Evaluation Patient Details Name: RHAWNIE HERMANS MRN: [id number] DOB: 14-Apr-1965 Today's Date: 02/02/2015   History of Present Illness  Patient is a 50 y/o female with hx of HTN and blood clots presents after fall on ice at home on 1/7. s/p ORIF right ankle.  Clinical Impression  Patient presents with pain, decreased strength and balance deficits with NWB status RLE limiting mobility. Requires Min A for SPT and LOB when attempting to hop today. Pt has been hopping at home with boot after fall PTA. Has lots of family support at home. Reviewed exercises. Pt has all necessary DME. Compliant with NWB RLE during mobility. Education provided on how to bump w/c up steps backwards. Pt plans to d/c home today with support from family. Will follow if still in hospital.     Follow Up Recommendations No PT follow up;Supervision for mobility/OOB    Equipment Recommendations  None recommended by PT    Recommendations for Other Services       Precautions / Restrictions Precautions Precautions: Fall Restrictions Weight Bearing Restrictions: Yes RLE Weight Bearing: Non weight bearing      Mobility  Bed Mobility Overal bed mobility: Needs Assistance Bed Mobility: Supine to Sit     Supine to sit: Min assist;HOB elevated     General bed mobility comments: Assist to bring RLE to EOB. Increased time. Use of rail. + dizziness.  Transfers Overall transfer level: Needs assistance Equipment used: Rolling walker (2 wheeled) Transfers: Sit to/from [facility] Sit to Stand: Min assist Stand pivot transfers: Min assist       General transfer comment: Min A to boost from EOB with cues for hand placement, x1 from [facility]. SPT bed to [facility] to chair with Min A for balance. Compliant with NWB RLE.  Ambulation/Gait Ambulation/Gait assistance: Min assist;Mod assist Ambulation Distance (Feet): 1 Feet Assistive device: Rolling walker (2 wheeled)     Gait velocity  interpretation: Below normal speed for age/gender General Gait Details: Able to do 1 hop with LOB requiring Mod A to prevent fall to the left. Reports too much pain and throbbing to hop right now.   Stairs            Wheelchair Mobility    Modified Rankin (Stroke Patients Only)       Balance Overall balance assessment: Needs assistance Sitting-balance support: Feet supported;No upper extremity supported Sitting balance-Leahy Scale: Fair     Standing balance support: During functional activity Standing balance-Leahy Scale: Poor Standing balance comment: Reliant on RW for support.                              Pertinent Vitals/Pain Pain Assessment: Faces Faces Pain Scale: Hurts even more Pain Location: right ankle Pain Descriptors / Indicators: Sore;Throbbing Pain Intervention(s): Monitored during session;Repositioned;Limited activity within patient's tolerance;Patient requesting pain meds-RN notified    Home Living Family/patient expects to be discharged to:: Private residence Living Arrangements: Children;Parent Available Help at Discharge: Family;Available 24 hours/day Type of Home: House Home Access: Stairs to enter Entrance Stairs-Rails: None Entrance Stairs-Number of Steps: 2 Home Layout: One level Home Equipment: Walker - 2 wheels;Bedside commode;Wheelchair - manual      Prior Function Level of Independence: Independent         Comments: Works.     Hand Dominance        Extremity/Trunk Assessment   Upper Extremity Assessment: Defer to OT evaluation  Lower Extremity Assessment: RLE deficits/detail;Generalized weakness RLE Deficits / Details: Able to perform LAQ and hip flexion, ankle AROM not assessed secondary to surgery.       Communication   Communication: No difficulties  Cognition Arousal/Alertness: Awake/alert Behavior During Therapy: WFL for tasks assessed/performed Overall Cognitive Status: Within Functional  Limits for tasks assessed                      General Comments General comments (skin integrity, edema, etc.): Discussed activity/exercise at home, importance of mobility, elevation, AROM. Educated provided on how to negotiate 2 steps with w/c backwards.    Exercises        Assessment/Plan    PT Assessment Patient needs continued PT services  PT Diagnosis Difficulty walking;Acute pain   PT Problem List Decreased strength;Pain;Decreased balance;Decreased mobility;Impaired sensation  PT Treatment Interventions Balance training;Gait training;Functional mobility training;Therapeutic activities;Therapeutic exercise;Patient/family education;Stair training   PT Goals (Current goals can be found in the Care Plan section) Acute Rehab PT Goals Patient Stated Goal: to go home to my own bed PT Goal Formulation: With patient Time For Goal Achievement: 02/16/15 Potential to Achieve Goals: Good    Frequency Min 3X/week   Barriers to discharge Inaccessible home environment 2 steps to enter home    Co-evaluation               End of Session Equipment Utilized During Treatment: Gait belt Activity Tolerance: Patient limited by pain Patient left: in chair;with call bell/phone within reach;with family/visitor present Nurse Communication: Mobility status;Patient requests pain meds    Functional Assessment Tool Used: Clinical judgment Functional Limitation: Mobility: Walking and moving around Mobility: Walking and Moving Around Current Status [phone]): At least 40 percent but less than 60 percent impaired, limited or restricted Mobility: Walking and Moving Around Goal Status [phone]): At least 1 percent but less than 20 percent impaired, limited or restricted    Time: 1333-1414 PT Time Calculation (min) (ACUTE ONLY): 41 min   Charges:   PT Evaluation $PT Eval Moderate Complexity: 1 Procedure PT Treatments $Therapeutic Activity: 8-22 mins $Self Care/Home Management: 8-22    PT G Codes:   PT G-Codes **NOT FOR INPATIENT CLASS** Functional Assessment Tool Used: Clinical judgment Functional Limitation: Mobility: Walking and moving around Mobility: Walking and Moving Around Current Status [id number]): At least 40 percent but less than 60 percent impaired, limited or restricted Mobility: Walking and Moving Around Goal Status [phone]): At least 1 percent but less than 20 percent impaired, limited or restricted    [place] 02/02/2015, 2:20 PM Wray Kearns, [place], DPT [phone]

## 2015-02-02 NOTE — Discharge Instructions (Signed)
Ice and elevation as needed for swelling. No weight at all on your right ankle. Keep your dressing clean and dry.

## 2015-02-02 NOTE — Op Note (Signed)
NAMEJULIET, [name] NO.:  [id number]  MEDICAL RECORD NO.:  [id number]  LOCATION:  [id number]                        FACILITY:  [facility]  PHYSICIAN:  Lind Guest. Ninfa Linden, M.D.DATE OF BIRTH:  09-25-1965  DATE OF PROCEDURE:  02/01/2015 DATE OF DISCHARGE:                              OPERATIVE REPORT   PREOPERATIVE DIAGNOSIS:  Unstable right ankle lateral malleolus fracture with widening of the ankle mortise.  POSTOPERATIVE DIAGNOSIS:  Unstable right ankle lateral malleolus fracture with widening of the ankle mortise.  PROCEDURE:  Open reduction and internal fixation of right ankle lateral malleolus fracture.  IMPLANTS:  Stryker VariAx ankle solutions of the lateral fibular plate with bicortical screws proximally and locking screws distally.  SURGEON:  Lind Guest. Ninfa Linden, M.D.  ASSISTANT:  Erskine Emery, PA-C.  ANESTHESIA:  General.  TOURNIQUET TIME:  Less than 1 hour.  BLOOD LOSS:  Minimal.  ANTIBIOTICS:  2 g of IV Ancef.  COMPLICATIONS:  None.  INDICATION:  [name] is a 50 year old female, who last Friday in inclement weather slipped on the ice, injuring her right ankle.  She was seen in the emergency room and found to have an unstable right ankle lateral malleolus fracture with widening of the ankle mortise.  Shown her x-ray, she understood the need for surgery to fix her ankle.  She also understands the risks and benefits involved including the risk of nerve injury, fracture, nonunion and unstable ankle.  After these issues and risks and benefits were discussed at length, she understood the need to proceed with surgery.  PROCEDURE DESCRIPTION:  After informed consent was obtained, appropriate right ankle was marked.  She was brought to the operating room, placed supine on the operating table, general anesthesia was then obtained.  A bump was placed on her right hip to internally rotate her ankle.  A nonsterile tourniquet was placed  around her upper right thigh and her right leg was prepped and draped from the knee down to the toes with DuraPrep and sterile drapes.  Time-out was called and she was identified as correct patient and correct right ankle.  We then used an Esmarch to wrap out the ankle and tourniquet was inflated to 300 mm of pressure. We made a direct midline incision over the fibula and carried this proximally and distally.  We dissected down the fracture site easily and was able to use reduction forceps to reduce the fracture.  We then placed a Stryker distal fibular plate along the lateral cortex of the fibula and secured this with bicortical screws proximally and locking screws distally.  Once this was accomplished, we stressed the ankle mortise under fluoroscopy and it was stable and intact.  We then irrigated the soft tissue wound with normal saline solution using bulb syringe.  We closed the deep tissue over the plate with 0 Vicryl followed by 2-0 Vicryl subcutaneous tissue and interrupted 2-0 nylon on the skin.  Xeroform and well-padded sterile dressing was applied.  The tourniquet let down.  Her toes pinked nicely.  She was awakened, extubated and taken to the recovery room in stable condition.  All final counts were correct.  There were no complications noted.  Of note, Erskine Emery, PA-C assisted  during this case and his assistance was crucial for facilitating all aspects of this case.     Lind Guest. Ninfa Linden, M.D.     CYB/MEDQ  D:  02/01/2015  T:  02/02/2015  Job:  [id number]

## 2015-02-03 NOTE — Discharge Summary (Signed)
Patient ID: [name] MRN: [id number] DOB/AGE: 48-Sep-1967 48 y.o.  Admit date: 02/01/2015 Discharge date: 02/03/2015  Admission Diagnoses:  Principal Problem:   Displaced fracture of lateral malleolus of right fibula Active Problems:   Fractured lateral malleolus   Discharge Diagnoses:  Same  Past Medical History  Diagnosis Date  . OBESITY, NOS 03/18/2006  . Seasonal allergies     takes Zyrtec and Claritin daily as well as Fluticasone  . HYPERTENSION, BENIGN SYSTEMIC 03/18/2006    takes Lisinopril-HCTZ daily  . DIAB W/UNSPEC COMP TYPE II/UNSPEC TYPE UNCNTRL 08/24/2008    takes Metformin,CInnamon,and Glipizide daily  . PONV (postoperative nausea and vomiting)   . Family history of adverse reaction to anesthesia     dad slow to wake up  . History of blood clots 8+ yrs ago    right calf  . Cough     taking Sudafed as needed.Iophen C as needed also for cough.No fever  . Joint pain   . Joint swelling   . Eczema   . GASTROESOPHAGEAL REFLUX, NO ESOPHAGITIS 03/18/2006    doesn't take any meds    Surgeries: Procedure(s): OPEN REDUCTION INTERNAL FIXATION RIGHT ANKLE FRACTURE on 02/01/2015   Consultants:    Discharged Condition: Improved  Hospital Course: [name] is an 50 y.o. female who was admitted 02/01/2015 for operative treatment ofDisplaced fracture of lateral malleolus of right fibula. Patient has severe unremitting pain that affects sleep, daily activities, and work/hobbies. After pre-op clearance the patient was taken to the operating room on 02/01/2015 and underwent  Procedure(s): OPEN REDUCTION INTERNAL FIXATION RIGHT ANKLE FRACTURE.    Patient was given perioperative antibiotics:  Anti-infectives    Start     Dose/Rate Route Frequency Ordered Stop   02/01/15 2000  ceFAZolin (ANCEF) IVPB 1 g/50 mL premix     1 g 100 mL/hr over 30 Minutes Intravenous Every 6 hours 02/01/15 1833 02/02/15 0938   02/01/15 1400  ceFAZolin (ANCEF) IVPB 2 g/50 mL premix      2 g 100 mL/hr over 30 Minutes Intravenous To [facility] 01/31/15 1132 02/01/15 1358       Patient was given sequential compression devices, early ambulation, and chemoprophylaxis to prevent DVT.  Patient benefited maximally from hospital stay and there were no complications.    Recent vital signs: No data found.    Recent laboratory studies: No results for input(s): WBC, HGB, HCT, PLT, NA, K, CL, CO2, BUN, CREATININE, GLUCOSE, INR, CALCIUM in the last 72 hours.  Invalid input(s): PT, 2   Discharge Medications:     Medication List    STOP taking these medications        ibuprofen 200 MG tablet  Commonly known as:  ADVIL,MOTRIN      TAKE these medications        aspirin 325 MG tablet  Take 325 mg by mouth 2 (two) times daily.     Biotin 1000 MCG tablet  Take 1,000 mcg by mouth daily.     cetirizine 10 MG tablet  Commonly known as:  ZYRTEC  Take 1 tablet (10 mg total) by mouth daily.     Cinnamon 500 MG capsule  Take 500 mg by mouth daily.     fluticasone 50 MCG/ACT nasal spray  Commonly known as:  FLONASE  Place 2 sprays into both nostrils daily.     glipiZIDE 5 MG 24 hr tablet  Commonly known as:  GLUCOTROL XL  Take 1 tablet (5 mg total) by  mouth daily with breakfast.     guaiFENesin 100 MG/5ML liquid  Commonly known as:  ROBITUSSIN  Take 200 mg by mouth 3 (three) times daily as needed for cough.     IMPLANON 68 MG Impl implant  Generic drug:  etonogestrel  1 each by Subdermal route once.     lisinopril-hydrochlorothiazide 20-25 MG tablet  Commonly known as:  PRINZIDE,ZESTORETIC  TAKE 1 TABLET BY MOUTH DAILY.     loratadine 10 MG tablet  Commonly known as:  CLARITIN  Take 10 mg by mouth daily.     metFORMIN 1000 MG tablet  Commonly known as:  GLUCOPHAGE  Take 1 tablet (1,000 mg total) by mouth 2 (two) times daily with a meal.     oxyCODONE-acetaminophen 5-325 MG tablet  Commonly known as:  PERCOCET/ROXICET  Take 1-2 tablets by mouth  every 4 (four) hours as needed for severe pain.     pseudoephedrine 120 MG 12 hr tablet  Commonly known as:  SUDAFED  Take 120 mg by mouth 2 (two) times daily.     triamcinolone ointment 0.5 %  Commonly known as:  KENALOG  Apply topically 2 (two) times daily.     vitamin C 500 MG tablet  Commonly known as:  ASCORBIC ACID  Take 500 mg by mouth daily.        Diagnostic Studies: Dg Ankle Complete Right  02/01/2015  CLINICAL DATA:  Open reduction internal fixation EXAM: DG C-ARM 61-120 MIN; RIGHT ANKLE - COMPLETE 3+ VIEW COMPARISON:  01/26/2015 FINDINGS: Intraoperative fluoroscopy shows placement of a distal fibular plate and screw for treatment of oblique fracture which is visible superiorly in the lateral projection. Medial clear space widening has reduced. IMPRESSION: Distal fibula fracture ORIF. Electronically Signed   By: Monte Fantasia M.D.   On: 02/01/2015 15:39   Dg Ankle Complete Right  01/26/2015  CLINICAL DATA:  Fall on ice today.  Right ankle pain and swelling. EXAM: RIGHT ANKLE - COMPLETE 3+ VIEW COMPARISON:  None. FINDINGS: There is an oblique fracture of the distal fibula extending from the meta diaphysis to the metaphysis. The distal fracture component is displaced laterally by 4 mm. The talus has subluxed laterally also by 4 mm. No medial malleolar fractures seen. There is a subtle fracture of the posterior malleolus, without significant displacement. Small ill-defined bone fragments are seen anterior to the tibial articular surface on the lateral view. There is diffuse surrounding soft tissue swelling. IMPRESSION: 1. Oblique fracture of the distal fibula. Fracture is displaced laterally, and the talus is subluxed laterally, 4 mm. 2. Fracture of the posterior margin of the distal tibia, the posterior malleolus, without significant displacement. Per Electronically Signed   By: Lajean Manes M.D.   On: 01/26/2015 18:52   Dg C-arm 1-60 Min  02/01/2015  CLINICAL DATA:  Open reduction  internal fixation EXAM: DG C-ARM 61-120 MIN; RIGHT ANKLE - COMPLETE 3+ VIEW COMPARISON:  01/26/2015 FINDINGS: Intraoperative fluoroscopy shows placement of a distal fibular plate and screw for treatment of oblique fracture which is visible superiorly in the lateral projection. Medial clear space widening has reduced. IMPRESSION: Distal fibula fracture ORIF. Electronically Signed   By: Monte Fantasia M.D.   On: 02/01/2015 15:39    Disposition: 01-Home or Self Care      Discharge Instructions    Call MD / Call 911    Complete by:  As directed   If you experience chest pain or shortness of breath, CALL 911 and be transported  to the hospital emergency room.  If you develope a fever above 101 F, pus (white drainage) or increased drainage or redness at the wound, or calf pain, call your surgeon's office.     Constipation Prevention    Complete by:  As directed   Drink plenty of fluids.  Prune juice may be helpful.  You may use a stool softener, such as Colace (over the counter) 100 mg twice a day.  Use MiraLax (over the counter) for constipation as needed.     Diet - low sodium heart healthy    Complete by:  As directed      Discharge patient    Complete by:  As directed      Increase activity slowly as tolerated    Complete by:  As directed            Follow-up Information    Follow up with Mcarthur Rossetti, MD In 2 weeks.   Specialty:  Orthopedic Surgery   Contact information:   [place] [address] [phone]       Follow up with [facility].   Why:  Someone from [place] will contact you concerning start date and time for therapy.   Contact information:   [address] [place] [address] [phone]        Signed: Mcarthur Rossetti 02/03/2015, 4:07 PM

## 2015-02-04 ENCOUNTER — Encounter (HOSPITAL_COMMUNITY): Payer: Self-pay | Admitting: Orthopaedic Surgery

## 2015-04-05 ENCOUNTER — Encounter: Payer: Self-pay | Admitting: Internal Medicine

## 2015-04-05 ENCOUNTER — Ambulatory Visit (INDEPENDENT_AMBULATORY_CARE_PROVIDER_SITE_OTHER): Payer: Federal, State, Local not specified - PPO | Admitting: Internal Medicine

## 2015-04-05 ENCOUNTER — Ambulatory Visit
Admission: RE | Admit: 2015-04-05 | Discharge: 2015-04-05 | Disposition: A | Discharge status: Home / Self Care | Payer: Federal, State, Local not specified - PPO | Source: Ambulatory Visit | Attending: Internal Medicine | Admitting: Internal Medicine

## 2015-04-05 VITALS — BP 106/72 | HR 97 | Temp 98.3°F | Resp 18 | Ht 60.0 in | Wt 237.0 lb

## 2015-04-05 DIAGNOSIS — S8261XS Displaced fracture of lateral malleolus of right fibula, sequela: Secondary | ICD-10-CM

## 2015-04-05 DIAGNOSIS — Z6841 Body Mass Index (BMI) 40.0 and over, adult: Secondary | ICD-10-CM

## 2015-04-05 DIAGNOSIS — I10 Essential (primary) hypertension: Secondary | ICD-10-CM | POA: Diagnosis not present

## 2015-04-05 DIAGNOSIS — E119 Type 2 diabetes mellitus without complications: Secondary | ICD-10-CM

## 2015-04-05 MED ORDER — LISINOPRIL-HYDROCHLOROTHIAZIDE 20-25 MG PO TABS: 1.0000 | ORAL_TABLET | Freq: Every day | ORAL | Status: DC

## 2015-04-05 NOTE — Assessment & Plan Note (Signed)
BP at goal on lisinopril/hctz and recent CMP normal.

## 2015-04-05 NOTE — Assessment & Plan Note (Signed)
Checking bone density to rule out bone loss. Likely normal given the severe slip on ice.

## 2015-04-05 NOTE — Patient Instructions (Signed)
We will have you get the bone density before you leave today.   No blood work today and take the metformin again. 1 pill daily for 1 week then 1 pill twice daily after that.   Keep working on being active.

## 2015-04-05 NOTE — Progress Notes (Signed)
   Subjective:    Patient ID: [name], female    DOB: September 08, 1965, 50 y.o.   MRN: [id number]  HPI The patient is a 50 YO female coming in for follow up of her diabetes (controlled without complications on metformin and glipizide and lisinopril, no side effects), her blood pressure (doing well on her lisinopril/hctz, not complicated) and her morbid obesity (weight is down from October by 7 pounds, trying to work on diet and exercise). No new complaints.   Review of Systems  Constitutional: Negative for activity change, appetite change and fatigue.  Respiratory: Negative.   Cardiovascular: Positive for leg swelling.       Right ankle  Gastrointestinal: Negative.   Musculoskeletal: Positive for arthralgias. Negative for back pain and gait problem.  Skin: Negative.   Neurological: Negative.   Psychiatric/Behavioral: Negative.       Objective:   Physical Exam  Constitutional: She is oriented to person, place, and time. She appears well-developed and well-nourished.  Overweight  HENT:  Head: Normocephalic and atraumatic.  Eyes: EOM are normal.  Neck: Normal range of motion. No thyromegaly present.  Cardiovascular: Normal rate and regular rhythm.   Pulmonary/Chest: Effort normal and breath sounds normal. No respiratory distress. She has no wheezes.  Abdominal: Soft. She exhibits no distension. There is no tenderness.  Musculoskeletal: She exhibits no edema.  Brace on the right ankle  Neurological: She is alert and oriented to person, place, and time. Coordination normal.  Skin: Skin is warm and dry.   Filed Vitals:   04/05/15 1336  BP: 106/72  Pulse: 97  Temp: 98.3 F (36.8 C)  TempSrc: Oral  Resp: 18  Height: 5' (1.524 m)  Weight: 237 lb (107.502 kg)  SpO2: 97%      Assessment & Plan:

## 2015-04-05 NOTE — Assessment & Plan Note (Signed)
Not taking her meds the last several months due to her ankle injury. Last HgA1c up at 7.9. She wants to continue working on weight loss and will resume the metformin 1000 mg BID. Not complicated. Is on ACE-I. Working on weight loss as well and down 7 pounds.

## 2015-04-05 NOTE — Progress Notes (Signed)
Pre visit review using our clinic review tool, if applicable. No additional management support is needed unless otherwise documented below in the visit note. 

## 2015-04-05 NOTE — Assessment & Plan Note (Signed)
She is down 7 pounds since the last visit. Encouraged her to continue being more mobile now that her ankle is healed. She will work on it.

## 2015-04-26 ENCOUNTER — Encounter: Payer: Self-pay | Admitting: Physical Therapy

## 2015-04-26 ENCOUNTER — Ambulatory Visit: Payer: Federal, State, Local not specified - PPO | Attending: Orthopaedic Surgery | Admitting: Physical Therapy

## 2015-04-26 DIAGNOSIS — R262 Difficulty in walking, not elsewhere classified: Secondary | ICD-10-CM | POA: Insufficient documentation

## 2015-04-26 DIAGNOSIS — M25571 Pain in right ankle and joints of right foot: Secondary | ICD-10-CM | POA: Diagnosis not present

## 2015-04-26 DIAGNOSIS — M25671 Stiffness of right ankle, not elsewhere classified: Secondary | ICD-10-CM | POA: Diagnosis not present

## 2015-04-26 NOTE — Therapy (Signed)
[place], [place], [address] Phone: [phone]   Fax:  [phone]  Physical Therapy Evaluation  Patient Details  Name: [name] MRN: [id number] Date of Birth: 01-Jan-1966 Referring Provider: Zollie Beckers MD  Encounter Date: 04/26/2015      PT End of Session - 04/26/15 1018    Visit Number 1   Number of Visits 7   Date for PT Re-Evaluation 06/07/15   Authorization Type BCBS   PT Start Time 0935   PT Stop Time 1005   PT Time Calculation (min) 30 min   Activity Tolerance Patient tolerated treatment well   Behavior During Therapy [facility] for tasks assessed/performed      Past Medical History  Diagnosis Date  . OBESITY, NOS 03/18/2006  . Seasonal allergies     takes Zyrtec and Claritin daily as well as Fluticasone  . HYPERTENSION, BENIGN SYSTEMIC 03/18/2006    takes Lisinopril-HCTZ daily  . DIAB W/UNSPEC COMP TYPE II/UNSPEC TYPE UNCNTRL 08/24/2008    takes Metformin,CInnamon,and Glipizide daily  . PONV (postoperative nausea and vomiting)   . Family history of adverse reaction to anesthesia     dad slow to wake up  . History of blood clots 8+ yrs ago    right calf  . Cough     taking Sudafed as needed.Iophen C as needed also for cough.No fever  . Joint pain   . Joint swelling   . Eczema   . GASTROESOPHAGEAL REFLUX, NO ESOPHAGITIS 03/18/2006    doesn't take any meds    Past Surgical History  Procedure Laterality Date  . Cholecystectomy    . Tubal ligation  2004  . Ablation    . Orif ankle fracture Right 02/01/2015    Procedure: OPEN REDUCTION INTERNAL FIXATION RIGHT ANKLE FRACTURE;  Surgeon: Mcarthur Rossetti, MD;  Location: [place];  Service: Orthopedics;  Laterality: Right;    There were no vitals filed for this visit.       Subjective Assessment - 04/26/15 1036    Subjective pt reports tightness, difficulty getting oob, tnder around heel when stepping, wearing lace up brace  for support   Pertinent History see above PMH   How long can you stand comfortably? 20-30 min   How long can you walk comfortably? 20-30 min   Patient Stated Goals walk without limp/wobble   Currently in Pain? Yes   Pain Score 4    Pain Location Ankle   Pain Orientation Right   Pain Descriptors / Indicators Aching  stiff/tight   Pain Type Acute pain   Pain Onset More than a month ago   Pain Frequency Intermittent   Aggravating Factors  walking, standing   Pain Relieving Factors medication, elevation   Effect of Pain on Daily Activities slightly limited, requiring rest breaks            [facility] PT Assessment - 04/26/15 0001    Assessment   Medical Diagnosis s/p ORIF R lateral maleolus   Referring Provider Zollie Beckers MD   Onset Date/Surgical Date 02/01/15   Hand Dominance Right   Precautions   Precautions None   Restrictions   Weight Bearing Restrictions No   Balance Screen   Has the patient fallen in the past 6 months Yes   How many times? 1   Has the patient had a decrease in activity level because of a fear of falling?  No   Is the patient reluctant to leave their home  because of a fear of falling?  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   Vocation Requirements pt reports being seated most of the day, unable to ice   Observation/Other Assessments-Edema    Edema Circumferential   Circumferential Edema   Circumferential - Right 31 cm  around malleouli, 6 inch above 38 cm   Figure 8 Edema   Figure 8 - Right  --   Sensation   Light Touch Appears Intact   ROM / Strength   AROM / PROM / Strength AROM;PROM;Strength   AROM   AROM Assessment Site Ankle   Right/Left Ankle Right   Right Ankle  Dorsiflexion -4   Right Ankle Plantar Flexion 34   PROM   PROM Assessment Site Ankle   Right/Left Ankle Right   Right Ankle Dorsiflexion -3   Right Ankle Plantar Flexion 34   Strength   Strength Assessment Site Ankle;Other (comment);Knee   Right/Left Knee Right   Right Knee Flexion 4-/5   Right Knee Extension 4+/5   Right/Left Ankle Right   Right Ankle Dorsiflexion 4+/5   Right Ankle Plantar Flexion 4-/5   Palpation   Palpation comment limited fascial mobility below R knee, denies TTP   Ambulation/Gait   Gait Pattern Decreased dorsiflexion - right;Right hip hike;Step-through pattern;Decreased stride length;Decreased weight shift to right;Right foot flat;Antalgic   Balance   Balance Assessed Yes   High Level Balance   High Level Balance Comments SLS requiring min assist                           PT Education - 04/26/15 1017    Education provided Yes   Education Details massage for edema mobilization, importance of ice and movement   Person(s) Educated Patient   Methods Explanation;Demonstration   Comprehension Verbalized understanding;Returned demonstration          PT Short Term Goals - 04/26/15 1043    PT SHORT TERM GOAL #1   Title able to demonstrate HEP without verbal cues as it is constructed   Time 2   Period Weeks   Status New   PT SHORT TERM GOAL #2   Title verbalize and demonstrate ability to decrease ankle swelling using RICE   Time 2   Period Weeks   Status New           PT Long Term Goals - 04/26/15 1040    PT LONG TERM GOAL #1   Title pain with walking and HHC pain <1/10   Baseline up to 4/10   Time 6   Period Weeks   Status New   PT LONG TERM GOAL #2   Title reduction in ankle edema (around malleoli) to equal L   Baseline see objective   Time 6   Period Weeks   Status New   PT LONG TERM GOAL #3   Title able to appropriately ascend/descend 2 steps   Baseline lacking DF ROM   Time 6   Period Weeks   Status New   PT  LONG TERM GOAL #4   Title able to ambulate without deviation of limping/slow  speed   Baseline decreased cadence and limp d/t lacking DF ROM at IE   Time 6   Period Weeks   Status New               Plan - 04/26/15 1020    Clinical Impression Statement Pt presents with significant edema below R knee that is affecting ROM in ankle/foot resulting in antalgic gait pattern. Notable recurvatum in bilateral knees with hamstring weakness. Pt will benefit from skilled PT in order to address lacking functional strength and ROM in RLE , reduce swelling and return to normalized gait pattern to avoid further injury to biomechanical chain.    Rehab Potential Good   PT Frequency 1x / week   PT Duration 6 weeks   PT Treatment/Interventions ADLs/Self Care Home Management;Cryotherapy;Electrical Stimulation;Moist Heat;Therapeutic exercise;Therapeutic activities;Functional mobility training;Stair training;Gait training;Balance training;Neuromuscular re-education;Patient/family education;Manual techniques;Vasopneumatic Device;Passive range of motion;Scar mobilization   PT Next Visit Plan recumbant bike, DF stretch, seated heel/toe raises, long-sitting ABCs, manual edema mobilization, game ready   PT Home Exercise Plan long sitting ABCs, ice (recommended 3x/day)   Consulted and Agree with Plan of Care Patient      Patient will benefit from skilled therapeutic intervention in order to improve the following deficits and impairments:  Abnormal gait, Decreased mobility, Decreased strength, Increased edema, Pain, Difficulty walking, Decreased range of motion, Increased fascial restricitons  Visit Diagnosis: Pain in right ankle and joints of right foot - Plan: PT plan of care cert/re-cert  Stiffness of right ankle, not elsewhere classified - Plan: PT plan of care cert/re-cert  Difficulty in walking, not elsewhere classified - Plan: PT plan of care cert/re-cert     Problem List Patient Active Problem List    Diagnosis Date Noted  . Displaced fracture of lateral malleolus of right fibula 02/01/2015  . Fractured lateral malleolus 02/01/2015  . Hyperlipidemia 09/26/2014  . Allergic rhinitis 06/30/2013  . Bilateral lower extremity edema 04/07/2011  . Diabetes mellitus type II, controlled ([place]) 08/24/2008  . Morbid obesity with BMI of 45.0-49.9, adult ([place]) 03/18/2006  . HYPERTENSION, BENIGN SYSTEMIC 03/18/2006  . GASTROESOPHAGEAL REFLUX, NO ESOPHAGITIS 03/18/2006    Selinda Eon 04/26/2015, 11:07 AM  [facility] [address] [place], [place], [address] Phone: [phone]   Fax:  [phone]  Name: NATASJA ZICKAFOOSE MRN: [id number] Date of Birth: 20-Dec-1965

## 2015-05-03 ENCOUNTER — Ambulatory Visit: Payer: Federal, State, Local not specified - PPO | Admitting: Internal Medicine

## 2015-05-09 ENCOUNTER — Ambulatory Visit (INDEPENDENT_AMBULATORY_CARE_PROVIDER_SITE_OTHER): Payer: Federal, State, Local not specified - PPO | Admitting: Family

## 2015-05-09 ENCOUNTER — Encounter: Payer: Self-pay | Admitting: Family

## 2015-05-09 VITALS — BP 120/80 | HR 95 | Temp 98.2°F | Ht 60.0 in | Wt 254.5 lb

## 2015-05-09 DIAGNOSIS — I82401 Acute embolism and thrombosis of unspecified deep veins of right lower extremity: Secondary | ICD-10-CM

## 2015-05-09 DIAGNOSIS — M7989 Other specified soft tissue disorders: Secondary | ICD-10-CM | POA: Diagnosis not present

## 2015-05-09 MED ORDER — DICLOFENAC SODIUM 1 % TD GEL: 4.0000 g | Freq: Four times a day (QID) | TRANSDERMAL | Status: DC

## 2015-05-09 MED ORDER — RIVAROXABAN (XARELTO) VTE STARTER PACK (15 & 20 MG): ORAL_TABLET | ORAL | Status: DC

## 2015-05-09 NOTE — Progress Notes (Signed)
Subjective:    Patient ID: [name], female    DOB: 05-Jan-1966, 50 y.o.   MRN: [id number]   TECORA Hanson is a 50 y.o. female who presents today for an acute visit.    HPI Comments: Patient here today for evalution of RLE swelling in calf. Fell in 01/2015 and broke right ankle, had surgery that month. Notes occasional swelling after surgery however worse today. Swelling improves ice, propping up. Orthopedic told patient that may have some swelling for next several months. Endorses mild, intermittent cough and weight gain of approx 20 lbs since last visit.  H/o of DVT. Takes ASA 325 mg daily.  Denies DOE. Denies exertional chest pain or pressure, numbness or tingling radiating to left arm or jaw, palpitations, dizziness, frequent headaches, changes in vision, or shortness of breath.    Past Medical History  Diagnosis Date  . OBESITY, NOS 03/18/2006  . Seasonal allergies     takes Zyrtec and Claritin daily as well as Fluticasone  . HYPERTENSION, BENIGN SYSTEMIC 03/18/2006    takes Lisinopril-HCTZ daily  . DIAB W/UNSPEC COMP TYPE II/UNSPEC TYPE UNCNTRL 08/24/2008    takes Metformin,CInnamon,and Glipizide daily  . PONV (postoperative nausea and vomiting)   . Family history of adverse reaction to anesthesia     dad slow to wake up  . History of blood clots 8+ yrs ago    right calf  . Cough     taking Sudafed as needed.Iophen C as needed also for cough.No fever  . Joint pain   . Joint swelling   . Eczema   . GASTROESOPHAGEAL REFLUX, NO ESOPHAGITIS 03/18/2006    doesn't take any meds   Review of patient's allergies indicates no known allergies. Current Outpatient Prescriptions on File Prior to Visit  Medication Sig Dispense Refill  . aspirin 325 MG tablet Take 325 mg by mouth 2 (two) times daily.     . Biotin 1000 MCG tablet Take 1,000 mcg by mouth daily.    . cetirizine (ZYRTEC) 10 MG tablet Take 1 tablet (10 mg total) by mouth daily. (Patient taking differently: Take  10 mg by mouth at bedtime. ) 30 tablet 11  . etonogestrel (IMPLANON) 68 MG IMPL implant 1 each by Subdermal route once.    . fluticasone (FLONASE) 50 MCG/ACT nasal spray Place 2 sprays into both nostrils daily. 32 g 5  . glipiZIDE (GLUCOTROL XL) 5 MG 24 hr tablet Take 1 tablet (5 mg total) by mouth daily with breakfast. 30 tablet 6  . guaiFENesin (ROBITUSSIN) 100 MG/5ML liquid Take 200 mg by mouth 3 (three) times daily as needed for cough. Reported on 04/05/2015    . ibuprofen (ADVIL,MOTRIN) 200 MG tablet Take 200 mg by mouth every 6 (six) hours as needed (prn).    Marland Kitchen lisinopril-hydrochlorothiazide (PRINZIDE,ZESTORETIC) 20-25 MG tablet Take 1 tablet by mouth daily. 90 tablet 3  . loratadine (CLARITIN) 10 MG tablet Take 10 mg by mouth daily.    . pseudoephedrine (SUDAFED) 120 MG 12 hr tablet Take 120 mg by mouth 2 (two) times daily. Reported on 04/05/2015    . triamcinolone ointment (KENALOG) 0.5 % Apply topically 2 (two) times daily. 30 g 0  . vitamin C (ASCORBIC ACID) 500 MG tablet Take 500 mg by mouth daily.    . Cinnamon 500 MG capsule Take 500 mg by mouth daily. Reported on 05/09/2015    . metFORMIN (GLUCOPHAGE) 1000 MG tablet Take 1 tablet (1,000 mg total) by mouth 2 (two) times  daily with a meal. (Patient not taking: Reported on 05/09/2015) 180 tablet 1  . oxyCODONE-acetaminophen (PERCOCET/ROXICET) 5-325 MG tablet Take 1-2 tablets by mouth every 4 (four) hours as needed for severe pain. (Patient not taking: Reported on 05/09/2015) 60 tablet 0   No current facility-administered medications on file prior to visit.    Social History  Substance Use Topics  . Smoking status: Never Smoker   . Smokeless tobacco: None  . Alcohol Use: No    Review of Systems  Constitutional: Negative for fever, chills and unexpected weight change.  HENT: Positive for postnasal drip. Negative for congestion, ear pain, sinus pressure and sore throat.   Eyes: Negative for visual disturbance.  Respiratory: Positive  for cough (with post nasal drip when I lay down). Negative for shortness of breath and wheezing.   Cardiovascular: Positive for leg swelling. Negative for chest pain (RLE) and palpitations.  Gastrointestinal: Negative for nausea and vomiting.  Musculoskeletal: Negative for myalgias and arthralgias.  Skin: Negative for rash.  Neurological: Negative for headaches.  Hematological: Negative for adenopathy.      Objective:    BP 120/80 mmHg  Pulse 95  Temp(Src) 98.2 F (36.8 C) (Oral)  Ht 5' (1.524 m)  Wt 254 lb 8 oz (115.44 kg)  BMI 49.70 kg/m2  SpO2 99%   Physical Exam  Constitutional: She appears well-developed and well-nourished.  Eyes: Conjunctivae are normal.  Cardiovascular: Normal rate, regular rhythm, normal heart sounds and normal pulses.   RLE edema, nonpitting. Significant asymmetry when compared right calf to left calf.  No palpable cords or masses. No erythema.  Mildly increased warmth. Negative Homan sign bilaterally.  LE hair growth symmetric and present. Hyperpigmentation of RLE. No varicosities noted.  LEs warm and palpable pedal pulses.   Pulmonary/Chest: Effort normal and breath sounds normal. She has no wheezes. She has no rhonchi. She has no rales.  Neurological: She is alert.  Skin: Skin is warm and dry.  Psychiatric: She has a normal mood and affect. Her speech is normal and behavior is normal. Thought content normal.  Vitals reviewed.      Assessment & Plan:   1. Swelling of right lower extremity [place] 4.5. Absence of DOE, SOB, tachycardia to indicate PE at this time.    The patient jointly decided to go ahead and start  Xarelto today with medication from our office ( drug reps). She will take her first dose today and take another dose in the morning prior to her ultrasound.  We will notify her as to whether or not to continue the Xarelto dose pack.  Education given to patient regarding worrisome symptoms including shortness of breath, chest pain,  palpitations, headache, dizziness and advised to get emergency room.  - VAS [place] LOWER EXTREMITY VENOUS (DVT); Future- Pending scheduling as full this afternoon and patient politely declined going to ED.    I am having Ms. Obenchain start on Rivaroxaban and diclofenac sodium. I am also having her maintain her cetirizine, metFORMIN, fluticasone, triamcinolone ointment, glipiZIDE, loratadine, aspirin, Cinnamon, vitamin C, Biotin, etonogestrel, guaiFENesin, pseudoephedrine, oxyCODONE-acetaminophen, lisinopril-hydrochlorothiazide, ibuprofen, and sitaGLIPtin.   Meds ordered this encounter  Medications  . sitaGLIPtin (JANUVIA) 100 MG tablet    Sig: Take 100 mg by mouth daily.  . Rivaroxaban (XARELTO STARTER PACK) 15 & 20 MG TBPK    Sig: Take as directed on package: Start with one 15mg  tablet by mouth twice a day with food. On Day 22, switch to one 20mg  tablet once a day  with food.    Dispense:  51 each    Refill:  0    Order Specific Question:  Supervising Provider    Answer:  Cassandria Anger [[id number]]  . diclofenac sodium (VOLTAREN) 1 % GEL    Sig: Apply 4 g topically 4 (four) times daily.    Dispense:  1 Tube    Refill:  3    Order Specific Question:  Supervising Provider    Answer:  Cassandria Anger [[id number]]     Start medications as prescribed and explained to patient on After Visit Summary ( AVS). Risks, benefits, and alternatives of the medications and treatment plan prescribed today were discussed, and patient expressed understanding.   Education regarding symptom management and diagnosis given to patient.   Follow-up:Plan follow-up as discussed or as needed if any worsening symptoms or change in condition. No Follow-up on file.   Continue to follow with Hoyt Koch, MD for routine health maintenance.   [name] and I agreed with plan.   Mable Paris, FNP  25 minutes spent with patient. > 50% of time spent counseling patient related to diagnosis.

## 2015-05-09 NOTE — Progress Notes (Signed)
Pre visit review using our clinic review tool, if applicable. No additional management support is needed unless otherwise documented below in the visit note. 

## 2015-05-09 NOTE — Patient Instructions (Addendum)
[place] tomorrow.   I'm going [place] ahead and start Xarelto dose pack  assuming that this is a DVT in your right leg. If the ultrasound comes back negative tomorrow and continue stop the Xarelto.   While you're on Xarelto, please discontinue all ibuprofen and aspirin. I have presribed topical Voltaren gel for you instead.   If all these tests are negative, as we discussed conservative measures for the swelling in of your leg including elevation, medical grade compression hose, and ice.

## 2015-05-10 ENCOUNTER — Telehealth: Payer: Self-pay | Admitting: Family

## 2015-05-10 ENCOUNTER — Ambulatory Visit (HOSPITAL_COMMUNITY)
Admission: RE | Admit: 2015-05-10 | Discharge: 2015-05-10 | Disposition: A | Discharge status: Home / Self Care | Payer: Federal, State, Local not specified - PPO | Source: Ambulatory Visit | Attending: Cardiovascular Disease | Admitting: Cardiovascular Disease

## 2015-05-10 ENCOUNTER — Ambulatory Visit: Payer: Federal, State, Local not specified - PPO | Admitting: Physical Therapy

## 2015-05-10 DIAGNOSIS — I10 Essential (primary) hypertension: Secondary | ICD-10-CM | POA: Insufficient documentation

## 2015-05-10 DIAGNOSIS — R262 Difficulty in walking, not elsewhere classified: Secondary | ICD-10-CM

## 2015-05-10 DIAGNOSIS — K219 Gastro-esophageal reflux disease without esophagitis: Secondary | ICD-10-CM | POA: Insufficient documentation

## 2015-05-10 DIAGNOSIS — M25571 Pain in right ankle and joints of right foot: Secondary | ICD-10-CM | POA: Diagnosis not present

## 2015-05-10 DIAGNOSIS — M7989 Other specified soft tissue disorders: Secondary | ICD-10-CM | POA: Insufficient documentation

## 2015-05-10 DIAGNOSIS — M25671 Stiffness of right ankle, not elsewhere classified: Secondary | ICD-10-CM

## 2015-05-10 NOTE — Telephone Encounter (Signed)
Let's wait to see what the US shows prior to taking another Xarelto dose.  If positive , of course I want her to stay on it.   Thanks

## 2015-05-10 NOTE — Therapy (Signed)
[place], [place], [address] Phone: [phone]   Fax:  [phone]  Physical Therapy Treatment  Patient Details  Name: [name] MRN: [id number] Date of Birth: 10-12-1965 Referring Provider: Zollie Beckers MD  Encounter Date: 05/10/2015      PT End of Session - 05/10/15 1037    Visit Number 1   Number of Visits 7   Date for PT Re-Evaluation 06/07/15   Authorization Type BCBS   PT Start Time 0845   PT Stop Time 0925   PT Time Calculation (min) 40 min   Activity Tolerance Patient tolerated treatment well   Behavior During Therapy [facility] for tasks assessed/performed      Past Medical History  Diagnosis Date  . OBESITY, NOS 03/18/2006  . Seasonal allergies     takes Zyrtec and Claritin daily as well as Fluticasone  . HYPERTENSION, BENIGN SYSTEMIC 03/18/2006    takes Lisinopril-HCTZ daily  . DIAB W/UNSPEC COMP TYPE II/UNSPEC TYPE UNCNTRL 08/24/2008    takes Metformin,CInnamon,and Glipizide daily  . PONV (postoperative nausea and vomiting)   . Family history of adverse reaction to anesthesia     dad slow to wake up  . History of blood clots 8+ yrs ago    right calf  . Cough     taking Sudafed as needed.Iophen C as needed also for cough.No fever  . Joint pain   . Joint swelling   . Eczema   . GASTROESOPHAGEAL REFLUX, NO ESOPHAGITIS 03/18/2006    doesn't take any meds    Past Surgical History  Procedure Laterality Date  . Cholecystectomy    . Tubal ligation  2004  . Ablation    . Orif ankle fracture Right 02/01/2015    Procedure: OPEN REDUCTION INTERNAL FIXATION RIGHT ANKLE FRACTURE;  Surgeon: Mcarthur Rossetti, MD;  Location: [place];  Service: Orthopedics;  Laterality: Right;    There were no vitals filed for this visit.      Subjective Assessment - 05/10/15 1033    Subjective Pt reports 1/10 pain today. she reports she is having a doppler today 2nd to a potential DVT. PT will  hold exercises today. She feels like she is still limping and having pain if she stands too long. Overall her pain is improving.    Patient is accompained by: Family member   Limitations Standing;Walking;House hold activities                         [place] Adult PT Treatment/Exercise - 05/10/15 0001    Manual Therapy   Manual therapy comments PROM into doris flexion. PA glides on bolster,                 PT Education - 05/10/15 1037    Education provided No   Person(s) Educated Patient   Methods Explanation   Comprehension Verbalized understanding          PT Short Term Goals - 04/26/15 1043    PT SHORT TERM GOAL #1   Title able to demonstrate HEP without verbal cues as it is constructed   Time 2   Period Weeks   Status New   PT SHORT TERM GOAL #2   Title verbalize and demonstrate ability to decrease ankle swelling using RICE   Time 2   Period Weeks   Status New           PT Long Term Goals - 04/26/15  [place] #1   Title pain with walking and HHC pain <1/10   Baseline up to 4/10   Time 6   Period Weeks   Status New   PT LONG TERM GOAL #2   Title reduction in ankle edema (around malleoli) to equal L   Baseline see objective   Time 6   Period Weeks   Status New   PT LONG TERM GOAL #3   Title able to appropriately ascend/descend 2 steps   Baseline lacking DF ROM   Time 6   Period Weeks   Status New   PT LONG TERM GOAL #4   Title able to ambulate without deviation of limping/slow speed   Baseline decreased cadence and limp d/t lacking DF ROM at IE   Time 6   Period Weeks   Status New               Plan - 05/10/15 1038    Clinical Impression Statement Therapy only performed range of motion 2nd to doppler today. PT educated her on her HEP using the L LE. She required only minimal cuing. Pt declined ice.    Rehab Potential Good   PT Frequency 1x / week   PT Duration 6 weeks   PT Treatment/Interventions  ADLs/Self Care Home Management;Cryotherapy;Electrical Stimulation;Moist Heat;Therapeutic exercise;Therapeutic activities;Functional mobility training;Stair training;Gait training;Balance training;Neuromuscular re-education;Patient/family education;Manual techniques;Vasopneumatic Device;Passive range of motion;Scar mobilization   PT Next Visit Plan recumbant bike, DF stretch, seated heel/toe raises, long-sitting ABCs, manual edema mobilization, game ready, review new exercises with the patient    PT Home Exercise Plan long sitting ABCs, ice (recommended 3x/day) t-band 4 way, standing weight shifts forward and back; gastroc stretch    Consulted and Agree with Plan of Care Patient      Patient will benefit from skilled therapeutic intervention in order to improve the following deficits and impairments:  Abnormal gait, Decreased mobility, Decreased strength, Increased edema, Pain, Difficulty walking, Decreased range of motion, Increased fascial restricitons  Visit Diagnosis: Difficulty in walking, not elsewhere classified  Stiffness of right ankle, not elsewhere classified  Pain in right ankle and joints of right foot     Problem List Patient Active Problem List   Diagnosis Date Noted  . Displaced fracture of lateral malleolus of right fibula 02/01/2015  . Fractured lateral malleolus 02/01/2015  . Hyperlipidemia 09/26/2014  . Allergic rhinitis 06/30/2013  . Bilateral lower extremity edema 04/07/2011  . Diabetes mellitus type II, controlled ([place]) 08/24/2008  . Morbid obesity with BMI of 45.0-49.9, adult ([place]) 03/18/2006  . HYPERTENSION, BENIGN SYSTEMIC 03/18/2006  . GASTROESOPHAGEAL REFLUX, NO ESOPHAGITIS 03/18/2006    Carney Living PT DPT  05/10/2015, 10:49 AM  [facility] [address] [place], [place], [address] Phone: [phone]   Fax:  [phone]  Name: [name] MRN: [id number] Date of Birth:  1965/11/21

## 2015-05-10 NOTE — Telephone Encounter (Signed)
Pt is aware.  

## 2015-05-10 NOTE — Telephone Encounter (Signed)
I got her scheduled for the [place] at noon today.  Pt is wondering if she also needs to take the xarelto after the ultra sound. Please advise

## 2015-05-17 ENCOUNTER — Ambulatory Visit: Payer: Federal, State, Local not specified - PPO | Admitting: Physical Therapy

## 2015-05-17 DIAGNOSIS — R262 Difficulty in walking, not elsewhere classified: Secondary | ICD-10-CM

## 2015-05-17 DIAGNOSIS — M25571 Pain in right ankle and joints of right foot: Secondary | ICD-10-CM | POA: Diagnosis not present

## 2015-05-17 DIAGNOSIS — M25671 Stiffness of right ankle, not elsewhere classified: Secondary | ICD-10-CM | POA: Diagnosis not present

## 2015-05-17 NOTE — Therapy (Signed)
[place], [place], [address] Phone: [phone]   Fax:  [phone]  Physical Therapy Treatment  Patient Details  Name: [name] MRN: [id number] Date of Birth: 10-Sep-1965 Referring Provider: Zollie Beckers MD  Encounter Date: 05/17/2015      PT End of Session - 05/17/15 1052    Visit Number 3   Number of Visits 7   Date for PT Re-Evaluation 06/07/15   PT Start Time 0852   PT Stop Time 0945   PT Time Calculation (min) 53 min      Past Medical History  Diagnosis Date  . OBESITY, NOS 03/18/2006  . Seasonal allergies     takes Zyrtec and Claritin daily as well as Fluticasone  . HYPERTENSION, BENIGN SYSTEMIC 03/18/2006    takes Lisinopril-HCTZ daily  . DIAB W/UNSPEC COMP TYPE II/UNSPEC TYPE UNCNTRL 08/24/2008    takes Metformin,CInnamon,and Glipizide daily  . PONV (postoperative nausea and vomiting)   . Family history of adverse reaction to anesthesia     dad slow to wake up  . History of blood clots 8+ yrs ago    right calf  . Cough     taking Sudafed as needed.Iophen C as needed also for cough.No fever  . Joint pain   . Joint swelling   . Eczema   . GASTROESOPHAGEAL REFLUX, NO ESOPHAGITIS 03/18/2006    doesn't take any meds    Past Surgical History  Procedure Laterality Date  . Cholecystectomy    . Tubal ligation  2004  . Ablation    . Orif ankle fracture Right 02/01/2015    Procedure: OPEN REDUCTION INTERNAL FIXATION RIGHT ANKLE FRACTURE;  Surgeon: Mcarthur Rossetti, MD;  Location: [facility];  Service: Orthopedics;  Laterality: Right;    There were no vitals filed for this visit.      Subjective Assessment - 05/17/15 1056    Subjective Pt. reports no pain today. She stated that she got her doppler results back and they are negative.   Currently in Pain? No/denies            [facility] PT Assessment - 05/17/15 0001    Circumferential Edema   Circumferential - Right 29.5 cm  around malleouli,   6 inches above, 35 cm   AROM   AROM Assessment Site Ankle   Right/Left Ankle Right   Right Ankle Dorsiflexion -4   PROM   PROM Assessment Site Ankle   Right/Left Ankle Right   Right Ankle Dorsiflexion 5                     [facility] Adult PT Treatment/Exercise - 05/17/15 0001    Ambulation/Gait   Stairs Yes   Stairs Assistance 7: Independent   Stair Management Technique One rail Left;Forwards;Alternating pattern   Number of Stairs 15   Height of Stairs 6   Gait Comments Pt. able to ascend stairs w/o pain or gait deviation   Exercises   Exercises Ankle   Modalities   Modalities Vasopneumatic   Vasopneumatic   Number Minutes Vasopneumatic  15 minutes   Vasopnuematic Location  Ankle   Vasopneumatic Pressure Low   Manual Therapy   Manual Therapy Edema management   Edema Management Massage to right ankle.   Ankle Exercises: Stretches   Gastroc Stretch 2 reps;30 seconds   Ankle Exercises: Standing   Heel Raises 15 reps  2 sets. chair used for stability.   Other Standing Ankle Exercises  6" step-ups x 10. rt.   Other Standing Ankle Exercises retro step-ups 6" x 10   Ankle Exercises: Supine   T-Band DF/PF, IV/EV.  2 sets x 12 reps. Green band.                  PT Short Term Goals - 05/17/15 1055    PT SHORT TERM GOAL #1   Title able to demonstrate HEP without verbal cues as it is constructed   Time 2   Period Weeks   Status On-going   PT SHORT TERM GOAL #2   Title verbalize and demonstrate ability to decrease ankle swelling using RICE   Time 2   Period Weeks   Status Achieved           PT Long Term Goals - 05/17/15 1018    PT LONG TERM GOAL #1   Title pain with walking and HHC pain <1/10   Baseline up to 4/10   Time 6   Period Weeks   Status On-going   PT LONG TERM GOAL #2   Title reduction in ankle edema (around malleoli) to equal L   Baseline see objective   Time 6   Period Weeks   Status On-going   PT LONG TERM  GOAL #3   Title able to appropriately ascend/descend 2 steps   Baseline lacking DF ROM   Time 6   Period Weeks   Status Achieved   PT LONG TERM GOAL #4   Title able to ambulate without deviation of limping/slow speed   Baseline decreased cadence and limp d/t lacking DF ROM at IE   Time 6   Period Weeks   Status On-going               Plan - 05/17/15 1015    Clinical Impression Statement Pt. stated that her doppler is clear. Pt. has achieved STG #2 of being independent with rice. VC's needed to maintain neutral ankle during step-ups.  She has made progress towards LTG #2 as she has a reduction in edema in her right ankle but it is not equal to the left ankle, and achieved LTG #3 of going up and down 2 steps with no pain.. Reviewed HEP as some exercises pt had not completed including standing calf stretch.   PT Next Visit Plan Manual edema mobilization, check to make sure she is independent with HEP. Game ready. Review seated dorsiflexion stretch with towel. and standing calf stretch.  Continue with ankle strengthening exercises, especially dorsiflexion, to increase ROM.      Patient will benefit from skilled therapeutic intervention in order to improve the following deficits and impairments:  Abnormal gait, Decreased mobility, Decreased strength, Increased edema, Pain, Difficulty walking, Decreased range of motion, Increased fascial restricitons  Visit Diagnosis: Difficulty in walking, not elsewhere classified  Stiffness of right ankle, not elsewhere classified  Pain in right ankle and joints of right foot     Problem List Patient Active Problem List   Diagnosis Date Noted  . Displaced fracture of lateral malleolus of right fibula 02/01/2015  . Fractured lateral malleolus 02/01/2015  . Hyperlipidemia 09/26/2014  . Allergic rhinitis 06/30/2013  . Bilateral lower extremity edema 04/07/2011  . Diabetes mellitus type II, controlled ([place]) 08/24/2008  . Morbid obesity with  BMI of 45.0-49.9, adult ([place]) 03/18/2006  . HYPERTENSION, BENIGN SYSTEMIC 03/18/2006  . GASTROESOPHAGEAL REFLUX, NO ESOPHAGITIS 03/18/2006    Reva Bores, SPTA 05/17/2015, 11:01 AM  [facility]  [place], [place], [address] Phone: [phone]   Fax:  [phone]  Name: [name] MRN: [id number] Date of Birth: 10/26/1965

## 2015-05-22 ENCOUNTER — Other Ambulatory Visit: Payer: Self-pay | Admitting: Internal Medicine

## 2015-05-23 ENCOUNTER — Encounter: Payer: Self-pay | Admitting: Family

## 2015-05-23 ENCOUNTER — Ambulatory Visit (INDEPENDENT_AMBULATORY_CARE_PROVIDER_SITE_OTHER): Payer: Federal, State, Local not specified - PPO | Admitting: Family

## 2015-05-23 VITALS — BP 128/78 | HR 100 | Temp 98.0°F | Ht 60.0 in | Wt 250.5 lb

## 2015-05-23 DIAGNOSIS — J019 Acute sinusitis, unspecified: Secondary | ICD-10-CM | POA: Diagnosis not present

## 2015-05-23 DIAGNOSIS — T3695XA Adverse effect of unspecified systemic antibiotic, initial encounter: Secondary | ICD-10-CM

## 2015-05-23 DIAGNOSIS — B379 Candidiasis, unspecified: Secondary | ICD-10-CM

## 2015-05-23 MED ORDER — FLUCONAZOLE 150 MG PO TABS: 150.0000 mg | ORAL_TABLET | Freq: Once | ORAL | Status: DC

## 2015-05-23 MED ORDER — BENZONATATE 100 MG PO CAPS: 100.0000 mg | ORAL_CAPSULE | Freq: Three times a day (TID) | ORAL | Status: DC | PRN

## 2015-05-23 MED ORDER — AMOXICILLIN 500 MG PO CAPS: 500.0000 mg | ORAL_CAPSULE | Freq: Two times a day (BID) | ORAL | Status: DC

## 2015-05-23 MED ORDER — HYDROCODONE-HOMATROPINE 5-1.5 MG/5ML PO SYRP: 5.0000 mL | ORAL_SOLUTION | Freq: Every evening | ORAL | Status: DC | PRN

## 2015-05-23 NOTE — Progress Notes (Signed)
Subjective:    Patient ID: [name], female    DOB: [birth year], 50 y.o.   MRN: [id number]   [name] is a 50 y.o. female who presents today for an acute visit.    HPI Comments: No asthma. Non smoking.   Sinusitis This is a new problem. The current episode started 1 to 4 weeks ago. The problem is unchanged. There has been no fever. The pain is mild. Associated symptoms include congestion, coughing, ear pain (left) and sinus pressure. Pertinent negatives include no chills or shortness of breath. Treatments tried: Robitussin, sudafed, flonase, claritin. The treatment provided mild relief.   Past Medical History  Diagnosis Date  . OBESITY, NOS 03/18/2006  . Seasonal allergies     takes Zyrtec and Claritin daily as well as Fluticasone  . HYPERTENSION, BENIGN SYSTEMIC 03/18/2006    takes Lisinopril-HCTZ daily  . DIAB W/UNSPEC COMP TYPE II/UNSPEC TYPE UNCNTRL 08/24/2008    takes Metformin,CInnamon,and Glipizide daily  . PONV (postoperative nausea and vomiting)   . Family history of adverse reaction to anesthesia     dad slow to wake up  . History of blood clots 8+ yrs ago    right calf  . Cough     taking Sudafed as needed.Iophen C as needed also for cough.No fever  . Joint pain   . Joint swelling   . Eczema   . GASTROESOPHAGEAL REFLUX, NO ESOPHAGITIS 03/18/2006    doesn't take any meds   Allergies: Review of patient's allergies indicates no known allergies. Current Outpatient Prescriptions on File Prior to Visit  Medication Sig Dispense Refill  . aspirin 325 MG tablet Take 325 mg by mouth 2 (two) times daily.     . Biotin 1000 MCG tablet Take 1,000 mcg by mouth daily.    . cetirizine (ZYRTEC) 10 MG tablet Take 1 tablet (10 mg total) by mouth daily. (Patient taking differently: Take 10 mg by mouth at bedtime. ) 30 tablet 11  . Cinnamon 500 MG capsule Take 500 mg by mouth daily. Reported on 05/09/2015    . diclofenac sodium (VOLTAREN) 1 % GEL Apply 4 g topically 4  (four) times daily. 1 Tube 3  . etonogestrel (IMPLANON) 68 MG IMPL implant 1 each by Subdermal route once.    . fluticasone (FLONASE) 50 MCG/ACT nasal spray Place 2 sprays into both nostrils daily. 32 g 5  . guaiFENesin (ROBITUSSIN) 100 MG/5ML liquid Take 200 mg by mouth 3 (three) times daily as needed for cough. Reported on 04/05/2015    . ibuprofen (ADVIL,MOTRIN) 200 MG tablet Take 200 mg by mouth every 6 (six) hours as needed (prn).    Marland Kitchen lisinopril-hydrochlorothiazide (PRINZIDE,ZESTORETIC) 20-25 MG tablet Take 1 tablet by mouth daily. 90 tablet 3  . loratadine (CLARITIN) 10 MG tablet Take 10 mg by mouth daily.    . pseudoephedrine (SUDAFED) 120 MG 12 hr tablet Take 120 mg by mouth 2 (two) times daily. Reported on 04/05/2015    . sitaGLIPtin (JANUVIA) 100 MG tablet Take 100 mg by mouth daily.    Marland Kitchen triamcinolone ointment (KENALOG) 0.5 % Apply topically 2 (two) times daily. 30 g 0  . vitamin C (ASCORBIC ACID) 500 MG tablet Take 500 mg by mouth daily.     No current facility-administered medications on file prior to visit.    Social History  Substance Use Topics  . Smoking status: Never Smoker   . Smokeless tobacco: None  . Alcohol Use: No    Review  of Systems  Constitutional: Negative for fever and chills.  HENT: Positive for congestion, ear pain (left), postnasal drip and sinus pressure.   Respiratory: Positive for cough. Negative for shortness of breath and wheezing.   Cardiovascular: Negative for chest pain and palpitations.  Gastrointestinal: Negative for nausea and vomiting.  Musculoskeletal: Negative for myalgias.      Objective:    BP 128/78 mmHg  Pulse 100  Temp(Src) 98 F (36.7 C) (Oral)  Ht 5' (1.524 m)  Wt 250 lb 8 oz (113.626 kg)  BMI 48.92 kg/m2  SpO2 99%   Physical Exam  Constitutional: She appears well-developed and well-nourished.  HENT:  Head: Normocephalic and atraumatic.  Right Ear: Hearing, tympanic membrane, external ear and ear canal normal. No  drainage, swelling or tenderness. No foreign bodies. Tympanic membrane is not erythematous and not bulging. No middle ear effusion. No decreased hearing is noted.  Left Ear: Hearing, tympanic membrane, external ear and ear canal normal. No drainage, swelling or tenderness. No foreign bodies. Tympanic membrane is not erythematous and not bulging.  No middle ear effusion. No decreased hearing is noted.  Nose: Rhinorrhea present. Right sinus exhibits maxillary sinus tenderness. Right sinus exhibits no frontal sinus tenderness. Left sinus exhibits maxillary sinus tenderness. Left sinus exhibits no frontal sinus tenderness.  Mouth/Throat: Uvula is midline, oropharynx is clear and moist and mucous membranes are normal. No oropharyngeal exudate, posterior oropharyngeal edema, posterior oropharyngeal erythema or tonsillar abscesses.  Eyes: Conjunctivae are normal.  Cardiovascular: Regular rhythm, normal heart sounds and normal pulses.   Pulmonary/Chest: Effort normal and breath sounds normal. She has no wheezes. She has no rhonchi. She has no rales.  Lymphadenopathy:       Head (right side): No submental, no submandibular, no tonsillar, no preauricular, no posterior auricular and no occipital adenopathy present.       Head (left side): No submental, no submandibular, no tonsillar, no preauricular, no posterior auricular and no occipital adenopathy present.    She has no cervical adenopathy.  Neurological: She is alert.  Skin: Skin is warm and dry.  Psychiatric: She has a normal mood and affect. Her speech is normal and behavior is normal. Thought content normal.  Vitals reviewed.      Assessment & Plan:   1. Acute sinusitis, recurrence not specified, unspecified location Due to duration of symptoms, patient and I agreed to treat with antibiotics. Advised to stop decongestants as heart rate is mildly elevated today. - amoxicillin (AMOXIL) 500 MG capsule; Take 1 capsule (500 mg total) by mouth 2 (two)  times daily.  Dispense: 14 capsule; Refill: 0 - benzonatate (TESSALON PERLES) 100 MG capsule; Take 1 capsule (100 mg total) by mouth 3 (three) times daily as needed for cough.  Dispense: 30 capsule; Refill: 1 - HYDROcodone-homatropine (HYCODAN) 5-1.5 MG/5ML syrup; Take 5 mLs by mouth at bedtime as needed for cough.  Dispense: 40 mL; Refill: 0  2. Antibiotic-induced yeast infection Patient requested as she gets yeast infections with antibiotics. - fluconazole (DIFLUCAN) 150 MG tablet; Take 1 tablet (150 mg total) by mouth once. Take one tablet PO once. If continue to have symptoms, may take one tablet PO 3 days later.  Dispense: 2 tablet; Refill: 1   I have discontinued Ms. Hendler's metFORMIN, glipiZIDE, oxyCODONE-acetaminophen, Rivaroxaban, and JANUVIA. I am also having her maintain her cetirizine, fluticasone, triamcinolone ointment, loratadine, aspirin, Cinnamon, vitamin C, Biotin, etonogestrel, guaiFENesin, pseudoephedrine, lisinopril-hydrochlorothiazide, ibuprofen, sitaGLIPtin, and diclofenac sodium.   No orders of the defined types were  placed in this encounter.     Start medications as prescribed and explained to patient on After Visit Summary ( AVS). Risks, benefits, and alternatives of the medications and treatment plan prescribed today were discussed, and patient expressed understanding.   Education regarding symptom management and diagnosis given to patient.   Follow-up:Plan follow-up as discussed or as needed if any worsening symptoms or change in condition. No Follow-up on file.   Continue to follow with Hoyt Koch, MD for routine health maintenance.   [name] and I agreed with plan.   Mable Paris, FNP

## 2015-05-23 NOTE — Patient Instructions (Signed)
Hycodan cough syrup for bedtime only. You may use the Tessalon during the day time.  Increase intake of clear fluids. Congestion is best treated by hydration, when mucus is wetter, it is thinner, less sticky, and easier to expel from the body, either through coughing up drainage, or by blowing your nose.   Get plenty of rest.   Use saline nasal drops and blow your nose frequently. Run a humidifier at night and elevate the head of the bed. Vicks Vapor rub will help with congestion and cough. Steam showers and sinus massage for congestion.   Use Acetaminophen or Ibuprofen as needed for fever or pain. Avoid second hand smoke. Even the smallest exposure will worsen symptoms.   Over the counter medications you can try include Delsym for cough, a decongestant for congestion, and Mucinex or Robitussin as an expectorant. Be sure to just get the plain Mucinex or Robitussin that just has one medication (Guaifenesen). We don't recommend the combination products. Note, be sure to drink two glasses of water with each dose of Mucinex as the medication will not work well without adequate hydration.   You can also try a teaspoon of honey to see if this will help reduce cough. Throat lozenges can sometimes be beneficial as well.    This illness will typically last 7 - 10 days.   Please follow up with our clinic if you develop a fever greater than 101 F, symptoms worsen, or do not resolve in the next week.

## 2015-05-23 NOTE — Progress Notes (Signed)
Pre visit review using our clinic review tool, if applicable. No additional management support is needed unless otherwise documented below in the visit note. 

## 2015-05-24 ENCOUNTER — Ambulatory Visit: Payer: Federal, State, Local not specified - PPO | Attending: Orthopaedic Surgery | Admitting: Physical Therapy

## 2015-05-24 DIAGNOSIS — M25671 Stiffness of right ankle, not elsewhere classified: Secondary | ICD-10-CM

## 2015-05-24 DIAGNOSIS — R262 Difficulty in walking, not elsewhere classified: Secondary | ICD-10-CM | POA: Diagnosis not present

## 2015-05-24 DIAGNOSIS — M25571 Pain in right ankle and joints of right foot: Secondary | ICD-10-CM

## 2015-05-24 NOTE — Therapy (Signed)
[place], [place], [address] Phone: [phone]   Fax:  [phone]  Physical Therapy Treatment  Patient Details  Name: [name] MRN: [id number] Date of Birth: 1965-04-20 Referring Provider: Zollie Beckers MD  Encounter Date: 05/24/2015      PT End of Session - 05/24/15 0852    Visit Number 4   Number of Visits 7   Date for PT Re-Evaluation 06/07/15   PT Start Time 0850   PT Stop Time 0948   PT Time Calculation (min) 58 min      Past Medical History  Diagnosis Date  . OBESITY, NOS 03/18/2006  . Seasonal allergies     takes Zyrtec and Claritin daily as well as Fluticasone  . HYPERTENSION, BENIGN SYSTEMIC 03/18/2006    takes Lisinopril-HCTZ daily  . DIAB W/UNSPEC COMP TYPE II/UNSPEC TYPE UNCNTRL 08/24/2008    takes Metformin,CInnamon,and Glipizide daily  . PONV (postoperative nausea and vomiting)   . Family history of adverse reaction to anesthesia     dad slow to wake up  . History of blood clots 8+ yrs ago    right calf  . Cough     taking Sudafed as needed.Iophen C as needed also for cough.No fever  . Joint pain   . Joint swelling   . Eczema   . GASTROESOPHAGEAL REFLUX, NO ESOPHAGITIS 03/18/2006    doesn't take any meds    Past Surgical History  Procedure Laterality Date  . Cholecystectomy    . Tubal ligation  2004  . Ablation    . Orif ankle fracture Right 02/01/2015    Procedure: OPEN REDUCTION INTERNAL FIXATION RIGHT ANKLE FRACTURE;  Surgeon: Mcarthur Rossetti, MD;  Location: [facility];  Service: Orthopedics;  Laterality: Right;    There were no vitals filed for this visit.      Subjective Assessment - 05/24/15 0852    Subjective just tightness in lowe calf, no ankle pain   Currently in Pain? No/denies            [facility] PT Assessment - 05/24/15 0001    Circumferential Edema   Circumferential - Right 27.75  6 inches above 35.5 cm                      [facility] Adult PT Treatment/Exercise - 05/24/15 0001    Ambulation/Gait   Ambulation/Gait Yes   Ambulation/Gait Assistance 7: Independent   Gait Pattern Within Functional Limits   Modalities   Modalities Moist Heat   Moist Heat Therapy   Number Minutes Moist Heat 15 Minutes   Moist Heat Location Ankle  prone to calf   Manual Therapy   Manual Therapy Soft tissue mobilization   Soft tissue mobilization Prone IASTM to calf musculature with TPR    Ankle Exercises: Aerobic   Stationary Bike Rec bike x 5 min level 2   Ankle Exercises: Supine   T-Band DF/PF, IV/EV.  2 sets x 15 reps. Green band.   Ankle Exercises: Stretches   Gastroc Stretch 3 reps;30 seconds  runners stretch                  PT Short Term Goals - 05/24/15 0930    PT SHORT TERM GOAL #1   Title able to demonstrate HEP without verbal cues as it is constructed   Time 2   Period Weeks   Status Achieved   PT SHORT TERM GOAL #2   Title  verbalize and demonstrate ability to decrease ankle swelling using RICE   Time 2   Period Weeks   Status Achieved           PT Long Term Goals - 05/17/15 1018    PT LONG TERM GOAL #1   Title pain with walking and HHC pain <1/10   Baseline up to 4/10   Time 6   Period Weeks   Status On-going   PT LONG TERM GOAL #2   Title reduction in ankle edema (around malleoli) to equal L   Baseline see objective   Time 6   Period Weeks   Status On-going   PT LONG TERM GOAL #3   Title able to appropriately ascend/descend 2 steps   Baseline lacking DF ROM   Time 6   Period Weeks   Status Achieved   PT LONG TERM GOAL #4   Title able to ambulate without deviation of limping/slow speed   Baseline decreased cadence and limp d/t lacking DF ROM at IE   Time 6   Period Weeks   Status On-going               Plan - 05/24/15 0919    Clinical Impression Statement Independent with initial HEP, All STGs Met. Ankle edema continues to decrease however lower leg edema still  present as well as tight with multiple trigger points in gastroc. IASTM used to soften calf musculature as well as trigger point release.    PT Next Visit Plan manual for tightness, edema management, ankle strength, gait      Patient will benefit from skilled therapeutic intervention in order to improve the following deficits and impairments:  Abnormal gait, Decreased mobility, Decreased strength, Increased edema, Pain, Difficulty walking, Decreased range of motion, Increased fascial restricitons  Visit Diagnosis: Difficulty in walking, not elsewhere classified  Stiffness of right ankle, not elsewhere classified  Pain in right ankle and joints of right foot     Problem List Patient Active Problem List   Diagnosis Date Noted  . Displaced fracture of lateral malleolus of right fibula 02/01/2015  . Fractured lateral malleolus 02/01/2015  . Hyperlipidemia 09/26/2014  . Allergic rhinitis 06/30/2013  . Bilateral lower extremity edema 04/07/2011  . Diabetes mellitus type II, controlled ([place]) 08/24/2008  . Morbid obesity with BMI of 45.0-49.9, adult ([place]) 03/18/2006  . HYPERTENSION, BENIGN SYSTEMIC 03/18/2006  . GASTROESOPHAGEAL REFLUX, NO ESOPHAGITIS 03/18/2006    Dorene Ar, PTA 05/24/2015, 9:33 AM  [place], [place], [address] Phone: [phone]   Fax:  [phone]  Name: [name] MRN: [id number] Date of Birth: 07-04-1965

## 2015-05-31 ENCOUNTER — Ambulatory Visit: Payer: Federal, State, Local not specified - PPO | Admitting: Physical Therapy

## 2015-05-31 DIAGNOSIS — M25571 Pain in right ankle and joints of right foot: Secondary | ICD-10-CM

## 2015-05-31 DIAGNOSIS — R262 Difficulty in walking, not elsewhere classified: Secondary | ICD-10-CM | POA: Diagnosis not present

## 2015-05-31 DIAGNOSIS — M25671 Stiffness of right ankle, not elsewhere classified: Secondary | ICD-10-CM | POA: Diagnosis not present

## 2015-05-31 NOTE — Therapy (Addendum)
[place], [place], [address] Phone: [phone]   Fax:  [phone]  Physical Therapy Treatment/Discharge Summary  Patient Details  Name: [name] MRN: [id number] Date of Birth: Mar 10, 1965 Referring Provider: Zollie Beckers MD  Encounter Date: 05/31/2015      PT End of Session - 05/31/15 0851    Visit Number 5   Number of Visits 7   Date for PT Re-Evaluation 06/07/15   Authorization Type BCBS   PT Start Time 0848   PT Stop Time 0935   PT Time Calculation (min) 47 min      Past Medical History  Diagnosis Date  . OBESITY, NOS 03/18/2006  . Seasonal allergies     takes Zyrtec and Claritin daily as well as Fluticasone  . HYPERTENSION, BENIGN SYSTEMIC 03/18/2006    takes Lisinopril-HCTZ daily  . DIAB W/UNSPEC COMP TYPE II/UNSPEC TYPE UNCNTRL 08/24/2008    takes Metformin,CInnamon,and Glipizide daily  . PONV (postoperative nausea and vomiting)   . Family history of adverse reaction to anesthesia     dad slow to wake up  . History of blood clots 8+ yrs ago    right calf  . Cough     taking Sudafed as needed.Iophen C as needed also for cough.No fever  . Joint pain   . Joint swelling   . Eczema   . GASTROESOPHAGEAL REFLUX, NO ESOPHAGITIS 03/18/2006    doesn't take any meds    Past Surgical History  Procedure Laterality Date  . Cholecystectomy    . Tubal ligation  2004  . Ablation    . Orif ankle fracture Right 02/01/2015    Procedure: OPEN REDUCTION INTERNAL FIXATION RIGHT ANKLE FRACTURE;  Surgeon: Mcarthur Rossetti, MD;  Location: [facility];  Service: Orthopedics;  Laterality: Right;    There were no vitals filed for this visit.      Subjective Assessment - 05/31/15 0850    Subjective Just tightness, in anterior ankle, calf feels better.    Currently in Pain? Yes   Pain Score 2    Pain Location Ankle   Pain Orientation Right   Pain Descriptors / Indicators Tightness   Aggravating Factors  wearing different shoes, weather   Pain Relieving Factors meds, elevation            [facility] PT Assessment - 05/31/15 0001    Circumferential Edema   Circumferential - Right 28.5, 6 inches above 34 cm  28 cm left   PROM   Right Ankle Dorsiflexion 4   Right Ankle Plantar Flexion 40                     [facility] Adult PT Treatment/Exercise - 05/31/15 0001    Modalities   Modalities Cryotherapy   Cryotherapy   Number Minutes Cryotherapy 10 Minutes   Cryotherapy Location Ankle   Type of Cryotherapy Ice pack  too cold, disc. early   Ankle Exercises: Aerobic   Stationary Bike Rec bike x 5 min level 2   Ankle Exercises: Stretches   Gastroc Stretch 3 reps;30 seconds  runners stretch   Ankle Exercises: Standing   Heel Raises 15 reps   Toe Raise 15 reps   Other Standing Ankle Exercises 6 inch step ups, retro step ups, step overs, SLS trials 10 sec best on right, left 20 sec best, Alternating step taps on and off foam, unilateral step taps, occassional UE support needed, SLS on foam 3 sec best  right.                   PT Short Term Goals - 05/24/15 0930    PT SHORT TERM GOAL #1   Title able to demonstrate HEP without verbal cues as it is constructed   Time 2   Period Weeks   Status Achieved   PT SHORT TERM GOAL #2   Title verbalize and demonstrate ability to decrease ankle swelling using RICE   Time 2   Period Weeks   Status Achieved           PT Long Term Goals - 05/31/15 3790    PT LONG TERM GOAL #1   Title pain with walking and HHC pain <1/10   Time 6   Period Weeks   Status Achieved   PT LONG TERM GOAL #2   Title reduction in ankle edema (around malleoli) to equal L   Baseline .5 cm difference R vs Left   Time 6   Period Weeks   Status Partially Met   PT LONG TERM GOAL #3   Title able to appropriately ascend/descend 2 steps   Time 6   Status Achieved   PT LONG TERM GOAL #4   Title able to ambulate without deviation  of limping/slow speed   Baseline mild gait deviations   Time 6   Period Weeks   Status Achieved               Plan - 05/31/15 1113    Clinical Impression Statement Pt thinks ankle edema has decreased. She measures .5 cm greater than her left ankle. Her calf edema has decreased as well. She reports only tightness and no pain. There is nothing she has difficulty with during ADL and House hold chores. She has achieved all  golas with the exception of ankle edema wihich is nearly met. Instructed pt in closed chain ankle exercises without increased pain. She will assess her response to todays treatment and will call Monday to cancel her next appointment is she does well.    PT Next Visit Plan assess tolerance to closed chain, probable discharge.       Patient will benefit from skilled therapeutic intervention in order to improve the following deficits and impairments:  Abnormal gait, Decreased mobility, Decreased strength, Increased edema, Pain, Difficulty walking, Decreased range of motion, Increased fascial restricitons  Visit Diagnosis: Difficulty in walking, not elsewhere classified  Stiffness of right ankle, not elsewhere classified  Pain in right ankle and joints of right foot     Problem List Patient Active Problem List   Diagnosis Date Noted  . Displaced fracture of lateral malleolus of right fibula 02/01/2015  . Fractured lateral malleolus 02/01/2015  . Hyperlipidemia 09/26/2014  . Allergic rhinitis 06/30/2013  . Bilateral lower extremity edema 04/07/2011  . Diabetes mellitus type II, controlled ([facility]) 08/24/2008  . Morbid obesity with BMI of 45.0-49.9, adult ([place]) 03/18/2006  . HYPERTENSION, BENIGN SYSTEMIC 03/18/2006  . GASTROESOPHAGEAL REFLUX, NO ESOPHAGITIS 03/18/2006    Dorene Ar, PTA  05/31/2015, 11:17 AM  [place], [place], [address] Phone: [phone]   Fax:   [phone]  Name: [name] MRN: [id number] Date of Birth: 1965-10-16  PHYSICAL THERAPY DISCHARGE SUMMARY  Visits from Start of Care: 5  Current functional level related to goals / functional outcomes: See above   Remaining deficits: See above   Education / Equipment: Anatomy of condition, POC,  HEP, exercise form/rationale  Plan: Patient agrees to discharge.  Patient goals were partially met. Patient is being discharged due to being pleased with the current functional level.  ?????     C. Hightower PT, DPT 03/24/16 1:19 PM

## 2015-06-07 ENCOUNTER — Encounter: Payer: Federal, State, Local not specified - PPO | Admitting: Physical Therapy

## 2015-06-12 ENCOUNTER — Other Ambulatory Visit: Payer: Self-pay | Admitting: Internal Medicine

## 2015-06-14 ENCOUNTER — Encounter: Payer: Federal, State, Local not specified - PPO | Admitting: Physical Therapy

## 2015-07-18 LAB — HM DIABETES EYE EXAM

## 2015-08-09 ENCOUNTER — Encounter: Payer: Self-pay | Admitting: Geriatric Medicine

## 2015-09-03 ENCOUNTER — Telehealth: Payer: Self-pay | Admitting: Internal Medicine

## 2015-09-03 NOTE — Telephone Encounter (Signed)
Pt called in and is going back to [facility] to remove [facility] from her arm but wants to know what Dr Sharlet Salina suggest she should take?

## 2015-09-03 NOTE — Telephone Encounter (Signed)
What she should take for what? If she needs to discuss birth control she can take to the ob/gyn taking out the nexplanon or schedule visit with [place].

## 2015-09-03 NOTE — Telephone Encounter (Signed)
Patient wanted to know what she should do for birth control. I told her to contact her gyn.

## 2015-10-14 DIAGNOSIS — Z1151 Encounter for screening for human papillomavirus (HPV): Secondary | ICD-10-CM | POA: Diagnosis not present

## 2015-10-14 DIAGNOSIS — Z01419 Encounter for gynecological examination (general) (routine) without abnormal findings: Secondary | ICD-10-CM | POA: Diagnosis not present

## 2015-10-14 DIAGNOSIS — N920 Excessive and frequent menstruation with regular cycle: Secondary | ICD-10-CM | POA: Diagnosis not present

## 2015-10-14 DIAGNOSIS — Z6841 Body Mass Index (BMI) 40.0 and over, adult: Secondary | ICD-10-CM | POA: Diagnosis not present

## 2015-10-14 DIAGNOSIS — Z124 Encounter for screening for malignant neoplasm of cervix: Secondary | ICD-10-CM | POA: Diagnosis not present

## 2015-10-14 DIAGNOSIS — Z1389 Encounter for screening for other disorder: Secondary | ICD-10-CM | POA: Diagnosis not present

## 2015-10-14 DIAGNOSIS — Z309 Encounter for contraceptive management, unspecified: Secondary | ICD-10-CM | POA: Diagnosis not present

## 2015-10-14 DIAGNOSIS — Z130 Encounter for screening for diseases of the blood and blood-forming organs and certain disorders involving the immune mechanism: Secondary | ICD-10-CM | POA: Diagnosis not present

## 2015-10-14 DIAGNOSIS — Z1231 Encounter for screening mammogram for malignant neoplasm of breast: Secondary | ICD-10-CM | POA: Diagnosis not present

## 2015-10-14 LAB — HM PAP SMEAR

## 2015-10-18 ENCOUNTER — Other Ambulatory Visit: Payer: Self-pay | Admitting: Obstetrics and Gynecology

## 2015-10-18 DIAGNOSIS — Z3049 Encounter for surveillance of other contraceptives: Secondary | ICD-10-CM | POA: Diagnosis not present

## 2015-10-18 DIAGNOSIS — Z3202 Encounter for pregnancy test, result negative: Secondary | ICD-10-CM | POA: Diagnosis not present

## 2015-10-18 DIAGNOSIS — R928 Other abnormal and inconclusive findings on diagnostic imaging of breast: Secondary | ICD-10-CM

## 2015-10-25 ENCOUNTER — Other Ambulatory Visit: Payer: Self-pay | Admitting: Obstetrics and Gynecology

## 2015-10-25 ENCOUNTER — Ambulatory Visit
Admission: RE | Admit: 2015-10-25 | Discharge: 2015-10-25 | Disposition: A | Discharge status: Home / Self Care | Payer: Federal, State, Local not specified - PPO | Source: Ambulatory Visit | Attending: Obstetrics and Gynecology | Admitting: Obstetrics and Gynecology

## 2015-10-25 DIAGNOSIS — R921 Mammographic calcification found on diagnostic imaging of breast: Secondary | ICD-10-CM

## 2015-10-25 DIAGNOSIS — R928 Other abnormal and inconclusive findings on diagnostic imaging of breast: Secondary | ICD-10-CM

## 2015-10-30 ENCOUNTER — Ambulatory Visit
Admission: RE | Admit: 2015-10-30 | Discharge: 2015-10-30 | Disposition: A | Discharge status: Home / Self Care | Payer: Federal, State, Local not specified - PPO | Source: Ambulatory Visit | Attending: Obstetrics and Gynecology | Admitting: Obstetrics and Gynecology

## 2015-10-30 DIAGNOSIS — R921 Mammographic calcification found on diagnostic imaging of breast: Secondary | ICD-10-CM

## 2015-10-30 DIAGNOSIS — N6092 Unspecified benign mammary dysplasia of left breast: Secondary | ICD-10-CM | POA: Diagnosis not present

## 2015-10-30 HISTORY — PX: BREAST BIOPSY: SHX20

## 2015-11-18 ENCOUNTER — Other Ambulatory Visit: Payer: Self-pay | Admitting: General Surgery

## 2015-11-18 DIAGNOSIS — Z6841 Body Mass Index (BMI) 40.0 and over, adult: Secondary | ICD-10-CM | POA: Diagnosis not present

## 2015-11-18 DIAGNOSIS — E119 Type 2 diabetes mellitus without complications: Secondary | ICD-10-CM | POA: Diagnosis not present

## 2015-11-18 DIAGNOSIS — I10 Essential (primary) hypertension: Secondary | ICD-10-CM | POA: Diagnosis not present

## 2015-11-18 DIAGNOSIS — D242 Benign neoplasm of left breast: Secondary | ICD-10-CM | POA: Diagnosis not present

## 2015-11-18 DIAGNOSIS — N6012 Diffuse cystic mastopathy of left breast: Secondary | ICD-10-CM

## 2016-01-06 DIAGNOSIS — J22 Unspecified acute lower respiratory infection: Secondary | ICD-10-CM | POA: Diagnosis not present

## 2016-02-03 DIAGNOSIS — B373 Candidiasis of vulva and vagina: Secondary | ICD-10-CM | POA: Diagnosis not present

## 2016-02-03 DIAGNOSIS — J40 Bronchitis, not specified as acute or chronic: Secondary | ICD-10-CM | POA: Diagnosis not present

## 2016-03-17 ENCOUNTER — Ambulatory Visit (INDEPENDENT_AMBULATORY_CARE_PROVIDER_SITE_OTHER): Payer: Federal, State, Local not specified - PPO | Admitting: Internal Medicine

## 2016-03-17 ENCOUNTER — Encounter: Payer: Self-pay | Admitting: Internal Medicine

## 2016-03-17 DIAGNOSIS — J019 Acute sinusitis, unspecified: Secondary | ICD-10-CM

## 2016-03-17 DIAGNOSIS — T3695XA Adverse effect of unspecified systemic antibiotic, initial encounter: Secondary | ICD-10-CM | POA: Diagnosis not present

## 2016-03-17 DIAGNOSIS — B379 Candidiasis, unspecified: Secondary | ICD-10-CM

## 2016-03-17 MED ORDER — DOXYCYCLINE HYCLATE 100 MG PO TABS: 100.0000 mg | ORAL_TABLET | Freq: Two times a day (BID) | ORAL | 0 refills | Status: DC

## 2016-03-17 MED ORDER — FLUCONAZOLE 150 MG PO TABS: 150.0000 mg | ORAL_TABLET | Freq: Once | ORAL | 1 refills | Status: AC

## 2016-03-17 MED ORDER — HYDROCODONE-HOMATROPINE 5-1.5 MG/5ML PO SYRP: 5.0000 mL | ORAL_SOLUTION | Freq: Every evening | ORAL | 0 refills | Status: DC | PRN

## 2016-03-17 NOTE — Progress Notes (Signed)
Pre visit review using our clinic review tool, if applicable. No additional management support is needed unless otherwise documented below in the visit note. 

## 2016-03-17 NOTE — Progress Notes (Signed)
   Subjective:    Patient ID: [name], female    DOB: 1965/04/30, 51 y.o.   MRN: [id number]  HPI The patient is a 51 YO female coming in for sinus congestion and pressure. This is going on for about 3 weeks now. She had this same thing back in December and was given amoxicillin which was too strong and she was unable to take due to side effects. She is having headaches, sinus pressure, drainage. She is having some cough which is keeping her up at night sometimes. No fevers or chills. Overall symptoms are worsening rather than improving.   Review of Systems  Constitutional: Positive for activity change, appetite change and fatigue. Negative for chills, fever and unexpected weight change.  HENT: Positive for congestion, ear pain, rhinorrhea, sinus pain, sinus pressure and sore throat. Negative for ear discharge, postnasal drip and trouble swallowing.   Eyes: Negative.   Respiratory: Positive for cough. Negative for shortness of breath and wheezing.   Cardiovascular: Negative.   Gastrointestinal: Negative.   Musculoskeletal: Negative.   Neurological: Negative.       Objective:   Physical Exam  Constitutional: She is oriented to person, place, and time. She appears well-developed and well-nourished.  Overweight  HENT:  Head: Normocephalic and atraumatic.  Right Ear: External ear normal.  Left Ear: External ear normal.  Oropharynx with redness and drainage, nose with crusting and sinus pressure on exam  Eyes: EOM are normal.  Neck: Normal range of motion. No JVD present.  Cardiovascular: Normal rate and regular rhythm.   Pulmonary/Chest: Effort normal and breath sounds normal. No respiratory distress. She has no wheezes. She has no rales.  Abdominal: Soft.  Lymphadenopathy:    She has no cervical adenopathy.  Neurological: She is alert and oriented to person, place, and time.  Skin: Skin is warm and dry.   Vitals:   03/17/16 1621  BP: 118/72  Pulse: (!) 107  Temp: 98.3  F (36.8 C)  TempSrc: Oral  SpO2: 99%  Weight: 245 lb (111.1 kg)  Height: 5' (1.524 m)      Assessment & Plan:

## 2016-03-17 NOTE — Patient Instructions (Addendum)
We have sent in the doxycycline for the sinuses. This is 1 pill twice a day for 1 week.   We have sent in the diflucan as well in case you get a yeast infection.

## 2016-03-18 ENCOUNTER — Telehealth: Payer: Self-pay | Admitting: Internal Medicine

## 2016-03-18 DIAGNOSIS — J302 Other seasonal allergic rhinitis: Secondary | ICD-10-CM

## 2016-03-18 MED ORDER — FLUTICASONE PROPIONATE 50 MCG/ACT NA SUSP: 2.0000 | Freq: Every day | NASAL | 5 refills | Status: DC

## 2016-03-18 NOTE — Telephone Encounter (Signed)
Sent in

## 2016-03-18 NOTE — Assessment & Plan Note (Signed)
Rx for doxycycline, refill flonase. Rx for hycodan for cough so she can sleep. Advised to continue taking zyrtec and flonase through allergy season in order to prevent recurrence.

## 2016-03-18 NOTE — Telephone Encounter (Signed)
fluticasone (FLONASE) 50 MCG  Patient states Dr.Crawford was going to call this in yesterday when she was here. She would like it called in. Please follow up with patient, once done. Thank you.

## 2016-04-02 ENCOUNTER — Other Ambulatory Visit: Payer: Self-pay | Admitting: General Surgery

## 2016-04-02 DIAGNOSIS — D242 Benign neoplasm of left breast: Secondary | ICD-10-CM

## 2016-04-10 ENCOUNTER — Encounter: Payer: Self-pay | Admitting: Internal Medicine

## 2016-04-10 ENCOUNTER — Ambulatory Visit (INDEPENDENT_AMBULATORY_CARE_PROVIDER_SITE_OTHER): Payer: Federal, State, Local not specified - PPO | Admitting: Internal Medicine

## 2016-04-10 ENCOUNTER — Other Ambulatory Visit (INDEPENDENT_AMBULATORY_CARE_PROVIDER_SITE_OTHER): Payer: Federal, State, Local not specified - PPO

## 2016-04-10 VITALS — BP 128/70 | HR 89 | Temp 98.2°F | Resp 12 | Ht 60.0 in | Wt 245.0 lb

## 2016-04-10 DIAGNOSIS — Z0000 Encounter for general adult medical examination without abnormal findings: Secondary | ICD-10-CM

## 2016-04-10 DIAGNOSIS — Z6841 Body Mass Index (BMI) 40.0 and over, adult: Secondary | ICD-10-CM | POA: Diagnosis not present

## 2016-04-10 DIAGNOSIS — Z23 Encounter for immunization: Secondary | ICD-10-CM

## 2016-04-10 DIAGNOSIS — E785 Hyperlipidemia, unspecified: Secondary | ICD-10-CM

## 2016-04-10 DIAGNOSIS — E119 Type 2 diabetes mellitus without complications: Secondary | ICD-10-CM

## 2016-04-10 DIAGNOSIS — I10 Essential (primary) hypertension: Secondary | ICD-10-CM

## 2016-04-10 DIAGNOSIS — E1169 Type 2 diabetes mellitus with other specified complication: Secondary | ICD-10-CM | POA: Diagnosis not present

## 2016-04-10 LAB — MICROALBUMIN / CREATININE URINE RATIO
CREATININE, U: 276.4 mg/dL
MICROALB UR: 3.8 mg/dL — AB (ref 0.0–1.9)
Microalb Creat Ratio: 1.4 mg/g (ref 0.0–30.0)

## 2016-04-10 LAB — COMPREHENSIVE METABOLIC PANEL
ALBUMIN: 3.7 g/dL (ref 3.5–5.2)
ALT: 20 U/L (ref 0–35)
AST: 20 U/L (ref 0–37)
Alkaline Phosphatase: 102 U/L (ref 39–117)
BUN: 9 mg/dL (ref 6–23)
CHLORIDE: 100 meq/L (ref 96–112)
CO2: 26 mEq/L (ref 19–32)
CREATININE: 0.9 mg/dL (ref 0.40–1.20)
Calcium: 9.2 mg/dL (ref 8.4–10.5)
GFR: 85.86 mL/min (ref >60.00)
Glucose, Bld: 242 mg/dL — ABNORMAL HIGH (ref 70–99)
Potassium: 3.6 mEq/L (ref 3.5–5.1)
Sodium: 135 mEq/L (ref 135–145)
Total Bilirubin: 0.3 mg/dL (ref 0.2–1.2)
Total Protein: 6.8 g/dL (ref 6.0–8.3)

## 2016-04-10 LAB — LIPID PANEL
CHOLESTEROL: 161 mg/dL (ref 0–200)
HDL: 41.5 mg/dL (ref >39.00)
LDL CALC: 94 mg/dL (ref 0–99)
NonHDL: 119.86
TRIGLYCERIDES: 127 mg/dL (ref 0.0–149.0)
Total CHOL/HDL Ratio: 4
VLDL: 25.4 mg/dL (ref 0.0–40.0)

## 2016-04-10 LAB — CBC
HCT: 38.7 % (ref 36.0–46.0)
Hemoglobin: 13 g/dL (ref 12.0–15.0)
MCHC: 33.6 g/dL (ref 30.0–36.0)
MCV: 89.2 fl (ref 78.0–100.0)
Platelets: 296 10*3/uL (ref 150.0–400.0)
RBC: 4.34 Mil/uL (ref 3.87–5.11)
RDW: 13.3 % (ref 11.5–15.5)
WBC: 9.8 10*3/uL (ref 4.0–10.5)

## 2016-04-10 LAB — TSH: TSH: 1.86 u[IU]/mL (ref 0.35–4.50)

## 2016-04-10 LAB — VITAMIN B12: VITAMIN B 12: 410 pg/mL (ref 211–911)

## 2016-04-10 LAB — HEMOGLOBIN A1C: Hgb A1c MFr Bld: 11.1 % — ABNORMAL HIGH (ref 4.6–6.5)

## 2016-04-10 NOTE — Assessment & Plan Note (Signed)
Last HgA1c slightly above goal, historically controlled. She is not taking [place] anymore but just metformin 1000 mg BID. Checking HgA1c and microalbumin to creatinine ratio. Not on statin but on ACE-I. Foot exam done. Eye exam up to date.

## 2016-04-10 NOTE — Patient Instructions (Addendum)
We will check the labs today and call you back with the results.   We have given you the tetanus shot today.   You can use the saline nose spray to help rinse out the sinuses.   Health Maintenance, Female Adopting a healthy lifestyle and getting preventive care can go a long way to promote health and wellness. Talk with your health care provider about what schedule of regular examinations is right for you. This is a good chance for you to check in with your provider about disease prevention and staying healthy. In between checkups, there are plenty of things you can do on your own. Experts have done a lot of research about which lifestyle changes and preventive measures are most likely to keep you healthy. Ask your health care provider for more information. Weight and diet Eat a healthy diet  Be sure to include plenty of vegetables, fruits, low-fat dairy products, and lean protein.  Do not eat a lot of foods high in solid fats, added sugars, or salt.  Get regular exercise. This is one of the most important things you can do for your health.  Most adults should exercise for at least 150 minutes each week. The exercise should increase your heart rate and make you sweat (moderate-intensity exercise).  Most adults should also do strengthening exercises at least twice a week. This is in addition to the moderate-intensity exercise. Maintain a healthy weight  Body mass index (BMI) is a measurement that can be used to identify possible weight problems. It estimates body fat based on height and weight. Your health care provider can help determine your BMI and help you achieve or maintain a healthy weight.  For females 38 years of age and older:  A BMI below 18.5 is considered underweight.  A BMI of 18.5 to 24.9 is normal.  A BMI of 25 to 29.9 is considered overweight.  A BMI of 30 and above is considered obese. Watch levels of cholesterol and blood lipids  You should start having your  blood tested for lipids and cholesterol at 49 years of age, then have this test every 5 years.  You may need to have your cholesterol levels checked more often if:  Your lipid or cholesterol levels are high.  You are older than 49 years of age.  You are at high risk for heart disease. Cancer screening Lung Cancer  Lung cancer screening is recommended for adults 60-58 years old who are at high risk for lung cancer because of a history of smoking.  A yearly low-dose CT scan of the lungs is recommended for people who:  Currently smoke.  Have quit within the past 15 years.  Have at least a 30-pack-year history of smoking. A pack year is smoking an average of one pack of cigarettes a day for 1 year.  Yearly screening should continue until it has been 15 years since you quit.  Yearly screening should stop if you develop a health problem that would prevent you from having lung cancer treatment. Breast Cancer  Practice breast self-awareness. This means understanding how your breasts normally appear and feel.  It also means doing regular breast self-exams. Let your health care provider know about any changes, no matter how small.  If you are in your 20s or 30s, you should have a clinical breast exam (CBE) by a health care provider every 1-3 years as part of a regular health exam.  If you are 61 or older, have a CBE every  year. Also consider having a breast X-ray (mammogram) every year.  If you have a family history of breast cancer, talk to your health care provider about genetic screening.  If you are at high risk for breast cancer, talk to your health care provider about having an MRI and a mammogram every year.  Breast cancer gene (BRCA) assessment is recommended for women who have family members with BRCA-related cancers. BRCA-related cancers include:  Breast.  Ovarian.  Tubal.  Peritoneal cancers.  Results of the assessment will determine the need for genetic counseling  and BRCA1 and BRCA2 testing. Cervical Cancer  Your health care provider may recommend that you be screened regularly for cancer of the pelvic organs (ovaries, uterus, and vagina). This screening involves a pelvic examination, including checking for microscopic changes to the surface of your cervix (Pap test). You may be encouraged to have this screening done every 3 years, beginning at age 50.  For women ages 29-65, health care providers may recommend pelvic exams and Pap testing every 3 years, or they may recommend the Pap and pelvic exam, combined with testing for human papilloma virus (HPV), every 5 years. Some types of HPV increase your risk of cervical cancer. Testing for HPV may also be done on women of any age with unclear Pap test results.  Other health care providers may not recommend any screening for nonpregnant women who are considered low risk for pelvic cancer and who do not have symptoms. Ask your health care provider if a screening pelvic exam is right for you.  If you have had past treatment for cervical cancer or a condition that could lead to cancer, you need Pap tests and screening for cancer for at least 20 years after your treatment. If Pap tests have been discontinued, your risk factors (such as having a new sexual partner) need to be reassessed to determine if screening should resume. Some women have medical problems that increase the chance of getting cervical cancer. In these cases, your health care provider may recommend more frequent screening and Pap tests. Colorectal Cancer  This type of cancer can be detected and often prevented.  Routine colorectal cancer screening usually begins at 49 years of age and continues through 49 years of age.  Your health care provider may recommend screening at an earlier age if you have risk factors for colon cancer.  Your health care provider may also recommend using home test kits to check for hidden blood in the stool.  A small  camera at the end of a tube can be used to examine your colon directly (sigmoidoscopy or colonoscopy). This is done to check for the earliest forms of colorectal cancer.  Routine screening usually begins at age 8.  Direct examination of the colon should be repeated every 5-10 years through 49 years of age. However, you may need to be screened more often if early forms of precancerous polyps or small growths are found. Skin Cancer  Check your skin from head to toe regularly.  Tell your health care provider about any new moles or changes in moles, especially if there is a change in a mole's shape or color.  Also tell your health care provider if you have a mole that is larger than the size of a pencil eraser.  Always use sunscreen. Apply sunscreen liberally and repeatedly throughout the day.  Protect yourself by wearing long sleeves, pants, a wide-brimmed hat, and sunglasses whenever you are outside. Heart disease, diabetes, and high blood pressure  High blood pressure causes heart disease and increases the risk of stroke. High blood pressure is more likely to develop in:  People who have blood pressure in the high end of the normal range (130-139/85-89 mm Hg).  People who are overweight or obese.  People who are African American.  If you are 38-93 years of age, have your blood pressure checked every 3-5 years. If you are 17 years of age or older, have your blood pressure checked every year. You should have your blood pressure measured twice-once when you are at a hospital or clinic, and once when you are not at a hospital or clinic. Record the average of the two measurements. To check your blood pressure when you are not at a hospital or clinic, you can use:  An automated blood pressure machine at a pharmacy.  A home blood pressure monitor.  If you are between 59 years and 36 years old, ask your health care provider if you should take aspirin to prevent strokes.  Have regular  diabetes screenings. This involves taking a blood sample to check your fasting blood sugar level.  If you are at a normal weight and have a low risk for diabetes, have this test once every three years after 49 years of age.  If you are overweight and have a high risk for diabetes, consider being tested at a younger age or more often. Preventing infection Hepatitis B  If you have a higher risk for hepatitis B, you should be screened for this virus. You are considered at high risk for hepatitis B if:  You were born in a country where hepatitis B is common. Ask your health care provider which countries are considered high risk.  Your parents were born in a high-risk country, and you have not been immunized against hepatitis B (hepatitis B vaccine).  You have HIV or AIDS.  You use needles to inject street drugs.  You live with someone who has hepatitis B.  You have had sex with someone who has hepatitis B.  You get hemodialysis treatment.  You take certain medicines for conditions, including cancer, organ transplantation, and autoimmune conditions. Hepatitis C  Blood testing is recommended for:  Everyone born from [birth year] through 1963.  Anyone with known risk factors for hepatitis C. Sexually transmitted infections (STIs)  You should be screened for sexually transmitted infections (STIs) including gonorrhea and chlamydia if:  You are sexually active and are younger than 49 years of age.  You are older than 49 years of age and your health care provider tells you that you are at risk for this type of infection.  Your sexual activity has changed since you were last screened and you are at an increased risk for chlamydia or gonorrhea. Ask your health care provider if you are at risk.  If you do not have HIV, but are at risk, it may be recommended that you take a prescription medicine daily to prevent HIV infection. This is called pre-exposure prophylaxis (PrEP). You are considered at  risk if:  You are sexually active and do not regularly use condoms or know the HIV status of your partner(s).  You take drugs by injection.  You are sexually active with a partner who has HIV. Talk with your health care provider about whether you are at high risk of being infected with HIV. If you choose to begin PrEP, you should first be tested for HIV. You should then be tested every 3 months for as long  as you are taking PrEP. Pregnancy  If you are premenopausal and you may become pregnant, ask your health care provider about preconception counseling.  If you may become pregnant, take 400 to 800 micrograms (mcg) of folic acid every day.  If you want to prevent pregnancy, talk to your health care provider about birth control (contraception). Osteoporosis and menopause  Osteoporosis is a disease in which the bones lose minerals and strength with aging. This can result in serious bone fractures. Your risk for osteoporosis can be identified using a bone density scan.  If you are 104 years of age or older, or if you are at risk for osteoporosis and fractures, ask your health care provider if you should be screened.  Ask your health care provider whether you should take a calcium or vitamin D supplement to lower your risk for osteoporosis.  Menopause may have certain physical symptoms and risks.  Hormone replacement therapy may reduce some of these symptoms and risks. Talk to your health care provider about whether hormone replacement therapy is right for you. Follow these instructions at home:  Schedule regular health, dental, and eye exams.  Stay current with your immunizations.  Do not use any tobacco products including cigarettes, chewing tobacco, or electronic cigarettes.  If you are pregnant, do not drink alcohol.  If you are breastfeeding, limit how much and how often you drink alcohol.  Limit alcohol intake to no more than 1 drink per day for nonpregnant women. One drink  equals 12 ounces of beer, 5 ounces of wine, or 1 ounces of hard liquor.  Do not use street drugs.  Do not share needles.  Ask your health care provider for help if you need support or information about quitting drugs.  Tell your health care provider if you often feel depressed.  Tell your health care provider if you have ever been abused or do not feel safe at home. This information is not intended to replace advice given to you by your health care provider. Make sure you discuss any questions you have with your health care provider. Document Released: 07/21/2010 Document Revised: 06/13/2015 Document Reviewed: 10/09/2014 Elsevier Interactive Patient Education  2017 [employer].

## 2016-04-10 NOTE — Assessment & Plan Note (Signed)
Discussed how her weight is complicated by her diabetes, hypertension, hyperlipidemia. She is working on getting back into exercise and encouraged her to do this.

## 2016-04-10 NOTE — Assessment & Plan Note (Signed)
BP at goal on her lisinopril/hctz 20/25 and checking CMP and adjust as needed.

## 2016-04-10 NOTE — Progress Notes (Signed)
Pre visit review using our clinic review tool, if applicable. No additional management support is needed unless otherwise documented below in the visit note. 

## 2016-04-10 NOTE — Assessment & Plan Note (Signed)
Needs lipid panel as none in some time for LDL goal <100. Not taking statin at this time which is likely indicated.

## 2016-04-10 NOTE — Assessment & Plan Note (Signed)
Colonoscopy due at 59, Tdap given today to update. Flu and pneumonia up to date. Counseled on weight loss and exercise for her health. Counseled on the dangers of distracted driving. Counseled on need for pap smear and she sees gyn for that and mammogram. Given screening recommendations.

## 2016-04-10 NOTE — Progress Notes (Signed)
   Subjective:    Patient ID: [name], female    DOB: 1965-01-22, 51 y.o.   MRN: [id number]  HPI The patient is a 51 YO female coming in for wellness. No follow up of her medical conditions in some time.   PMH, [facility], social history reviewed and updated.   Review of Systems  Constitutional: Negative.   HENT: Negative.   Eyes: Negative.   Respiratory: Negative for cough, chest tightness and shortness of breath.   Cardiovascular: Negative for chest pain, palpitations and leg swelling.  Gastrointestinal: Negative for abdominal distention, abdominal pain, constipation, diarrhea, nausea and vomiting.  Musculoskeletal: Positive for arthralgias and joint swelling. Negative for back pain, gait problem, myalgias, neck pain and neck stiffness.       Right ankle  Skin: Negative.   Neurological: Negative.   Psychiatric/Behavioral: Negative.       Objective:   Physical Exam  Constitutional: She is oriented to person, place, and time. She appears well-developed and well-nourished.  Obese  HENT:  Head: Normocephalic and atraumatic.  Eyes: EOM are normal.  Neck: Normal range of motion.  Cardiovascular: Normal rate and regular rhythm.   Pulmonary/Chest: Effort normal and breath sounds normal. No respiratory distress. She has no wheezes. She has no rales.  Abdominal: Soft. Bowel sounds are normal. She exhibits no distension. There is no tenderness. There is no rebound.  Musculoskeletal: She exhibits no edema.  Neurological: She is alert and oriented to person, place, and time. Coordination normal.  Skin: Skin is warm and dry.  Foot exam done  Psychiatric: She has a normal mood and affect.   Vitals:   04/10/16 0858  BP: 128/70  Pulse: 89  Resp: 12  Temp: 98.2 F (36.8 C)  TempSrc: Oral  SpO2: 99%  Weight: 245 lb (111.1 kg)  Height: 5' (1.524 m)      Assessment & Plan:  Tdap given at visit

## 2016-04-14 ENCOUNTER — Telehealth: Payer: Self-pay | Admitting: Internal Medicine

## 2016-04-14 DIAGNOSIS — J302 Other seasonal allergic rhinitis: Secondary | ICD-10-CM

## 2016-04-14 NOTE — Telephone Encounter (Signed)
Tried contacting, got answer on home phone and left message for her to cal back

## 2016-04-14 NOTE — Telephone Encounter (Signed)
Pt called stating she came in Friday 3/23 and was supposed to get all of her prescriptions refilled and they were not put in.

## 2016-04-16 MED ORDER — METFORMIN HCL 1000 MG PO TABS: ORAL_TABLET | ORAL | 3 refills | Status: DC

## 2016-04-16 MED ORDER — FLUTICASONE PROPIONATE 50 MCG/ACT NA SUSP: 2.0000 | Freq: Every day | NASAL | 5 refills | Status: DC

## 2016-04-16 MED ORDER — LISINOPRIL-HYDROCHLOROTHIAZIDE 20-25 MG PO TABS: 1.0000 | ORAL_TABLET | Freq: Every day | ORAL | 3 refills | Status: DC

## 2016-04-16 MED ORDER — TRIAMCINOLONE ACETONIDE 0.5 % EX OINT: TOPICAL_OINTMENT | Freq: Two times a day (BID) | CUTANEOUS | 0 refills | Status: DC

## 2016-04-16 NOTE — Telephone Encounter (Signed)
Sent!

## 2016-04-16 NOTE — Telephone Encounter (Signed)
Metformin, flonase, lisinopril HTZ, Triamcinolone ointment - needs sent to [facility] on [address].

## 2016-04-17 DIAGNOSIS — K080 Exfoliation of teeth due to systemic causes: Secondary | ICD-10-CM | POA: Diagnosis not present

## 2016-04-29 ENCOUNTER — Telehealth: Payer: Self-pay | Admitting: Internal Medicine

## 2016-04-29 NOTE — Telephone Encounter (Signed)
Patient Name: [name] DOB: Jul 02, 1965 Initial Comment caller states she wants to know how long antibiotics stay in your system because she fells like she has a yeast infection. It is itching an tingling . She is at work an states to just leave a message Nurse Assessment Nurse: Vallery Sa, RN, [place] Date/Time (Eastern Time): 04/29/2016 12:33:48 PM Confirm and document reason for call. If symptomatic, describe symptoms. ---Caller states she developed vaginal yeast symptoms about a week ago that didn't respond to medication. No fever. Alert and responsive. Does the patient have any new or worsening symptoms? ---Yes Will a triage be completed? ---Yes Related visit to physician within the last 2 weeks? ---No Does the PT have any chronic conditions? (i.e. diabetes, asthma, etc.) ---Yes List chronic conditions. ---Yeast infections, High Blood Pressure, Diabetes Is the patient pregnant or possibly pregnant? (Ask all females between the ages of 75-55) ---No Is this a behavioral health or substance abuse call? ---No Guidelines Guideline Title Affirmed Question Affirmed Notes Vaginal Discharge [1] Symptoms of a "yeast infection" (i.e., itchy, white discharge, not bad smelling) AND [2] not improved > 3 days following CARE ADVICE Final Disposition User See PCP When Office is Open (within 3 days) Vallery Sa, RN, Cathy Comments Scheduled for 4:15pm appointment 04/30/16 with Sharlet Salina. Referrals REFERRED TO PCP OFFICE Disagree/Comply: Comply

## 2016-04-30 ENCOUNTER — Ambulatory Visit (INDEPENDENT_AMBULATORY_CARE_PROVIDER_SITE_OTHER): Payer: Federal, State, Local not specified - PPO | Admitting: Internal Medicine

## 2016-04-30 ENCOUNTER — Other Ambulatory Visit: Payer: Self-pay | Admitting: Internal Medicine

## 2016-04-30 ENCOUNTER — Encounter: Payer: Self-pay | Admitting: Internal Medicine

## 2016-04-30 DIAGNOSIS — N368 Other specified disorders of urethra: Secondary | ICD-10-CM | POA: Diagnosis not present

## 2016-04-30 MED ORDER — ESTRADIOL 0.1 MG/GM VA CREA: 1.0000 | TOPICAL_CREAM | Freq: Every day | VAGINAL | 12 refills | Status: DC

## 2016-04-30 NOTE — Progress Notes (Signed)
Pre visit review using our clinic review tool, if applicable. No additional management support is needed unless otherwise documented below in the visit note. 

## 2016-04-30 NOTE — Progress Notes (Signed)
   Subjective:    Patient ID: [name], female    DOB: [birth year], 51 y.o.   MRN: [id number]  HPI The patient is a 51 YO female coming in for urethral opening irritation. She denies discharge or burning with urinating. She is having some raw feeling with wiping. She is having more urinary incontinence and is using a pad now all the time. She is not sure if this is causing some rubbing. She denies new sexual partners in the last year. No vaginal pain or discharge. No rash or skin lesion. No fevers or chills.   Review of Systems  Constitutional: Negative for activity change, appetite change, fatigue, fever and unexpected weight change.  Respiratory: Negative.   Cardiovascular: Negative.   Gastrointestinal: Negative.   Genitourinary: Positive for frequency and urgency. Negative for decreased urine volume, difficulty urinating, dyspareunia, dysuria, flank pain, genital sores, hematuria, menstrual problem and pelvic pain.       Urethral discomfort  Musculoskeletal: Negative.       Objective:   Physical Exam  Constitutional: She appears well-developed and well-nourished.  HENT:  Head: Normocephalic and atraumatic.  Eyes: EOM are normal.  Cardiovascular: Normal rate and regular rhythm.   Pulmonary/Chest: Effort normal and breath sounds normal.  Abdominal: Soft. Bowel sounds are normal. She exhibits no distension. There is no tenderness. There is no rebound.  Genitourinary:  Genitourinary Comments: Declines pelvic exam  Skin: Skin is warm and dry.   Vitals:   04/30/16 1608  BP: 138/80  Pulse: (!) 109  Resp: 14  Temp: 98.7 F (37.1 C)  TempSrc: Oral  SpO2: 100%  Weight: 245 lb (111.1 kg)  Height: 5' (1.524 m)      Assessment & Plan:

## 2016-04-30 NOTE — Assessment & Plan Note (Signed)
Rx for estrace cream for possible dryness. She is advised that using a pad all the time can cause rubbing and irritation. If this continues she will call her gyn for exam and further treatment.

## 2016-04-30 NOTE — Patient Instructions (Signed)
We have sent in the cream which you can use on the inside and the outside. Use a pea sized amount daily.   We will get you in with the clinic to work on the weight.

## 2016-06-16 ENCOUNTER — Telehealth: Payer: Self-pay | Admitting: Internal Medicine

## 2016-06-16 NOTE — Telephone Encounter (Signed)
Patient states that she was to be referred to the  weight loss center.  Please follow up in regard.  If patient does not answer, please leave vm.

## 2016-06-17 NOTE — Telephone Encounter (Signed)
Patient contacted and aware                                                                                                          

## 2016-06-17 NOTE — Telephone Encounter (Signed)
She was given information about the bariatric seminar. She would need to register and attend one of these seminars to get into the weight loss center. This is something she has to do as we discussed at visit.

## 2016-06-17 NOTE — Telephone Encounter (Signed)
please advise.

## 2016-07-20 ENCOUNTER — Other Ambulatory Visit: Payer: Self-pay | Admitting: Internal Medicine

## 2016-07-20 ENCOUNTER — Encounter: Payer: Self-pay | Admitting: Family Medicine

## 2016-07-20 ENCOUNTER — Ambulatory Visit (INDEPENDENT_AMBULATORY_CARE_PROVIDER_SITE_OTHER): Payer: Federal, State, Local not specified - PPO | Admitting: Family Medicine

## 2016-07-20 VITALS — BP 110/70 | HR 110 | Temp 98.4°F | Wt 245.9 lb

## 2016-07-20 DIAGNOSIS — R21 Rash and other nonspecific skin eruption: Secondary | ICD-10-CM

## 2016-07-20 MED ORDER — LOSARTAN POTASSIUM 100 MG PO TABS: 100.0000 mg | ORAL_TABLET | Freq: Every day | ORAL | 6 refills | Status: DC

## 2016-07-20 NOTE — Progress Notes (Signed)
Subjective:     Patient ID: [name], female   DOB: August 31, 1965, 51 y.o.   MRN: [id number]  HPI Patient seen as a work in with 2 week history of somewhat faint skin rash mostly on her upper extremities. She states this is somewhat pruritic. Possibly worse with sun exposure. She's tried multiple things topically including Benadryl gel and triamcinolone without relief. Also tried some oral Benadryl and Zyrtec. Denies any fever. No generalized rash. Rash seems to be confined to her shoulders and upper extremities and mostly sun exposed areas. No facial rash. No history of similar rash.  She has type 2 diabetes poorly controlled with last A1c over 11%.  Past Medical History:  Diagnosis Date  . Cough    taking Sudafed as needed.Iophen C as needed also for cough.No fever  . DIAB W/UNSPEC COMP TYPE II/UNSPEC TYPE UNCNTRL 08/24/2008   takes Metformin,CInnamon,and Glipizide daily  . Eczema   . Family history of adverse reaction to anesthesia    dad slow to wake up  . GASTROESOPHAGEAL REFLUX, NO ESOPHAGITIS 03/18/2006   doesn't take any meds  . History of blood clots 8+ yrs ago   right calf  . HYPERTENSION, BENIGN SYSTEMIC 03/18/2006   takes Lisinopril-HCTZ daily  . Joint pain   . Joint swelling   . OBESITY, NOS 03/18/2006  . PONV (postoperative nausea and vomiting)   . Seasonal allergies    takes Zyrtec and Claritin daily as well as Fluticasone   Past Surgical History:  Procedure Laterality Date  . ABLATION    . CHOLECYSTECTOMY    . ORIF ANKLE FRACTURE Right 02/01/2015   Procedure: OPEN REDUCTION INTERNAL FIXATION RIGHT ANKLE FRACTURE;  Surgeon: Mcarthur Rossetti, MD;  Location: [facility];  Service: Orthopedics;  Laterality: Right;  . TUBAL LIGATION  2004    reports that she has never smoked. She has never used smokeless tobacco. She reports that she does not drink alcohol or use drugs. family history includes Diabetes in her mother; Hypertension in her father and mother. No Known  Allergies   Review of Systems  Constitutional: Negative for chills and fever.  HENT: Negative for sore throat.   Respiratory: Negative for cough.   Skin: Positive for rash.       Objective:   Physical Exam  Constitutional: She appears well-developed and well-nourished.  Cardiovascular: Normal rate and regular rhythm.   Pulmonary/Chest: Effort normal and breath sounds normal. No respiratory distress. She has no wheezes. She has no rales.  Skin:  Patient has very faint erythematous minimally raised rash on her forearms, arms, and shoulders.       Assessment:     Faint skin rash.? Urticarial. Etiology unclear.?related to Lisinopril HCTZ.    Plan:     -She has pending appointment with dermatology but not until end of the month -She has already tried both topical antihistamines and oral antihistamines. I am reluctant to place her on steroids with uncontrolled type 2 diabetes -Consider trial off lisinopril HCTZ and try losartan 100 mg once daily -Follow-up to reassess blood pressure in 3-4 weeks -Also suggested consider trial of over-the-counter Pepcid or Zantac  Eulas Post MD [facility]

## 2016-07-20 NOTE — Patient Instructions (Signed)
Stop the Lisinopril HCTZ and start Losartan one daily Stay out of the sun as much as possible.   Follow up in one month for BP recheck.

## 2016-07-23 IMAGING — DX DG ANKLE COMPLETE 3+V*R*
3 series · 3 of 3 positions shown · non-contrast
Comparison: None.

CLINICAL DATA: Fall on ice today.  Right ankle pain and swelling.

EXAM:
RIGHT ANKLE - COMPLETE 3+ VIEW

[ankle ap]
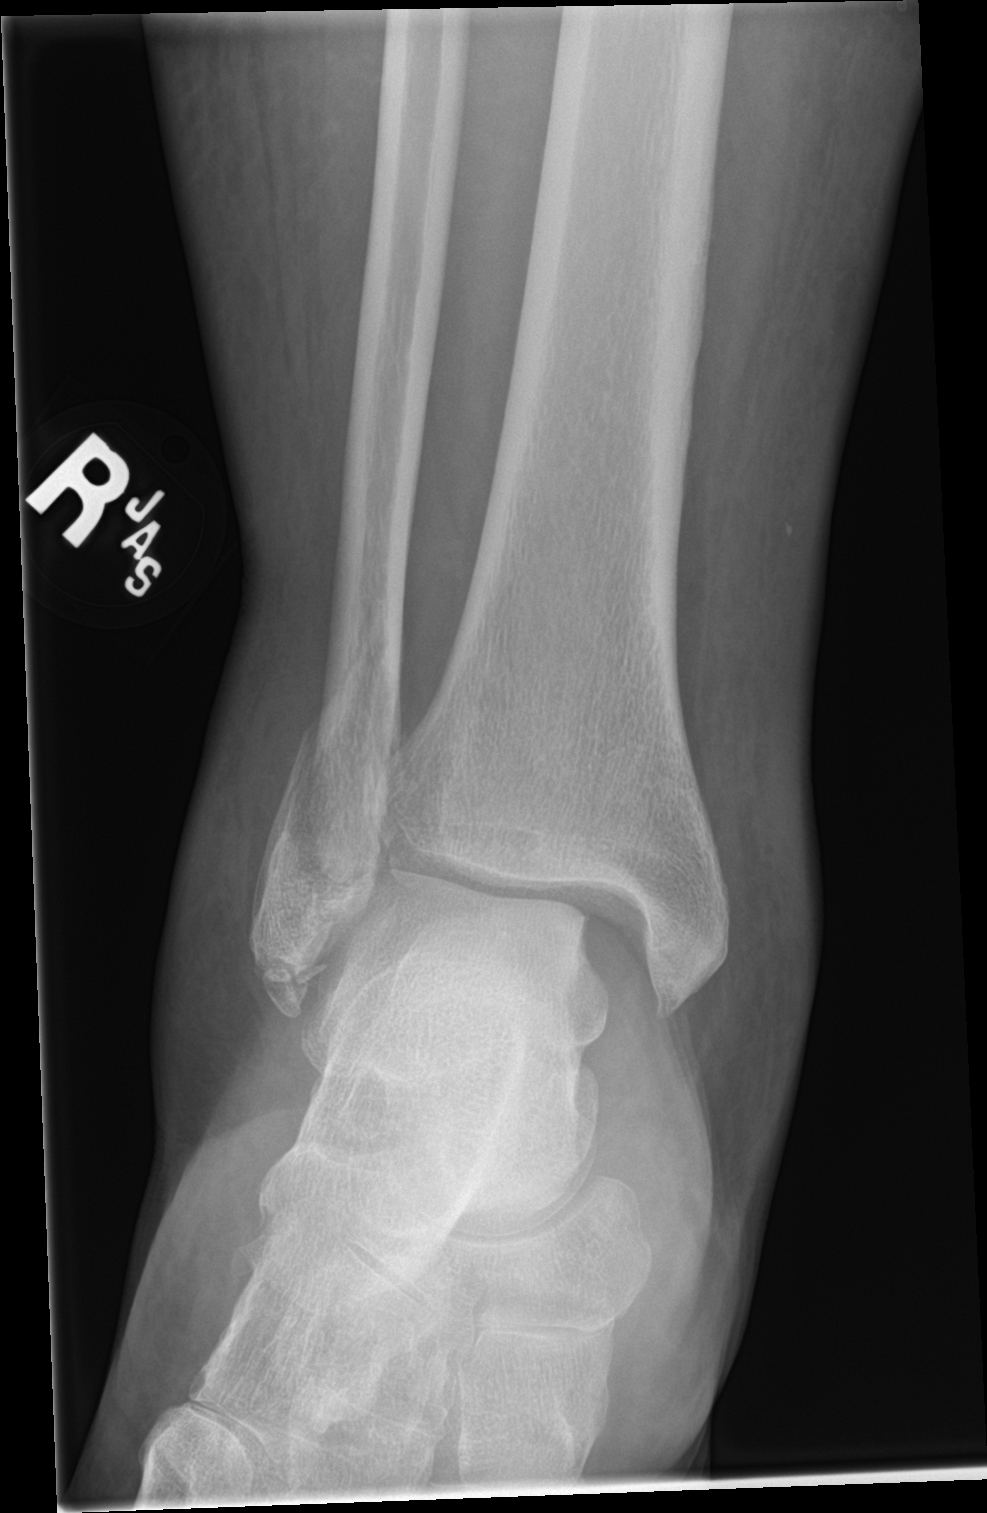

[ankle obl]
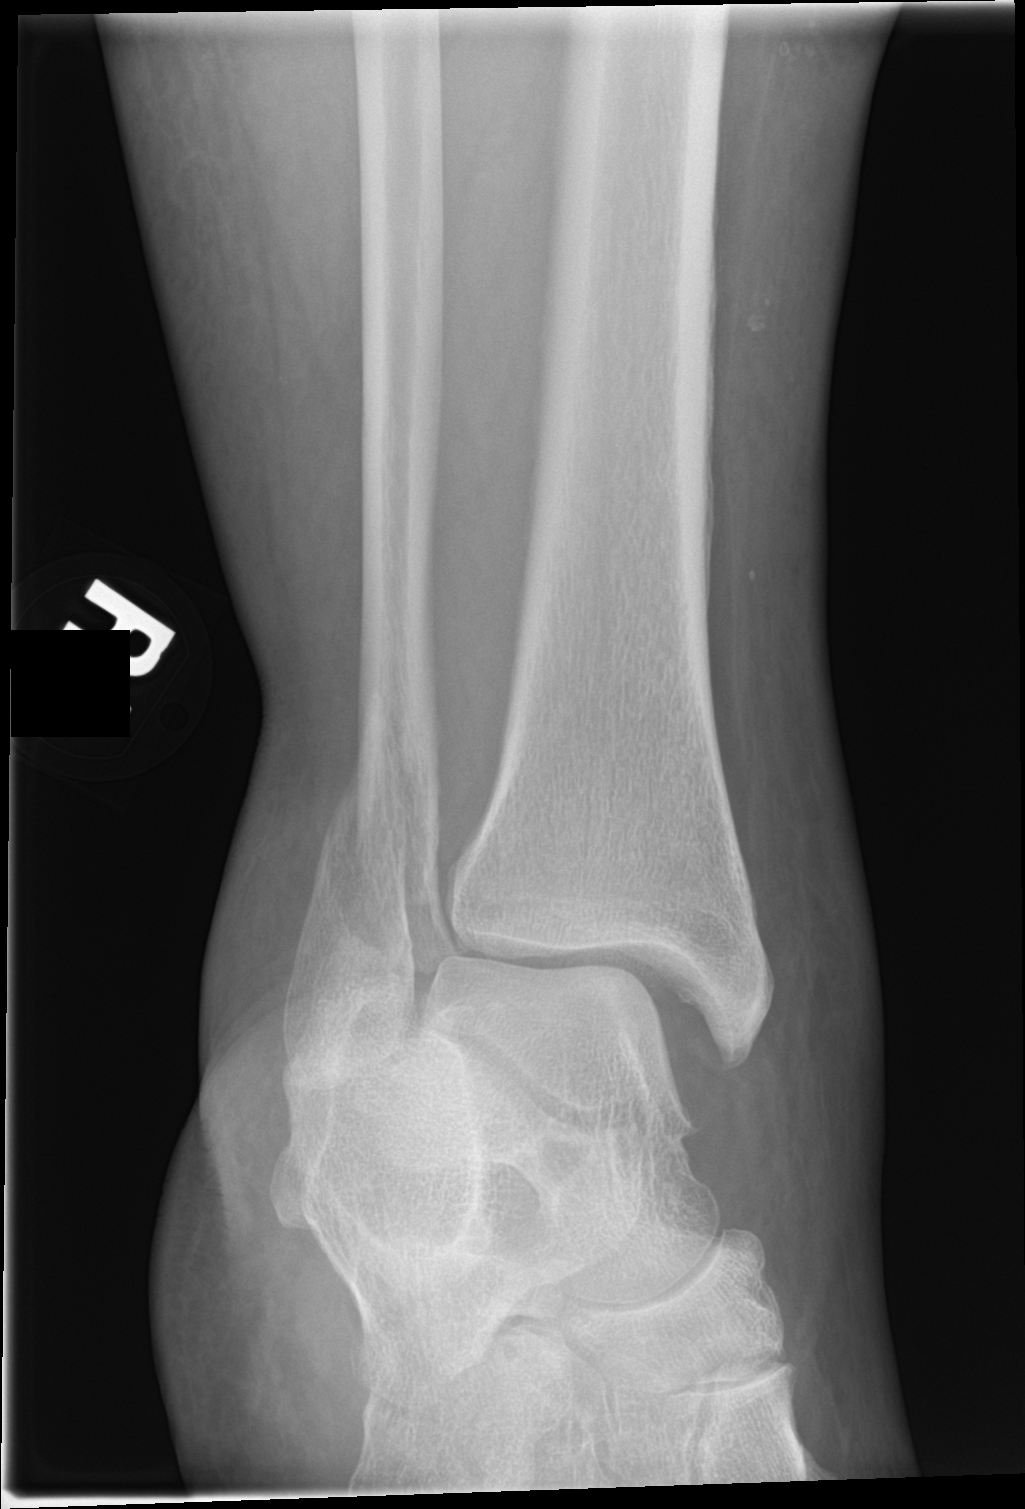

[ankle lat]
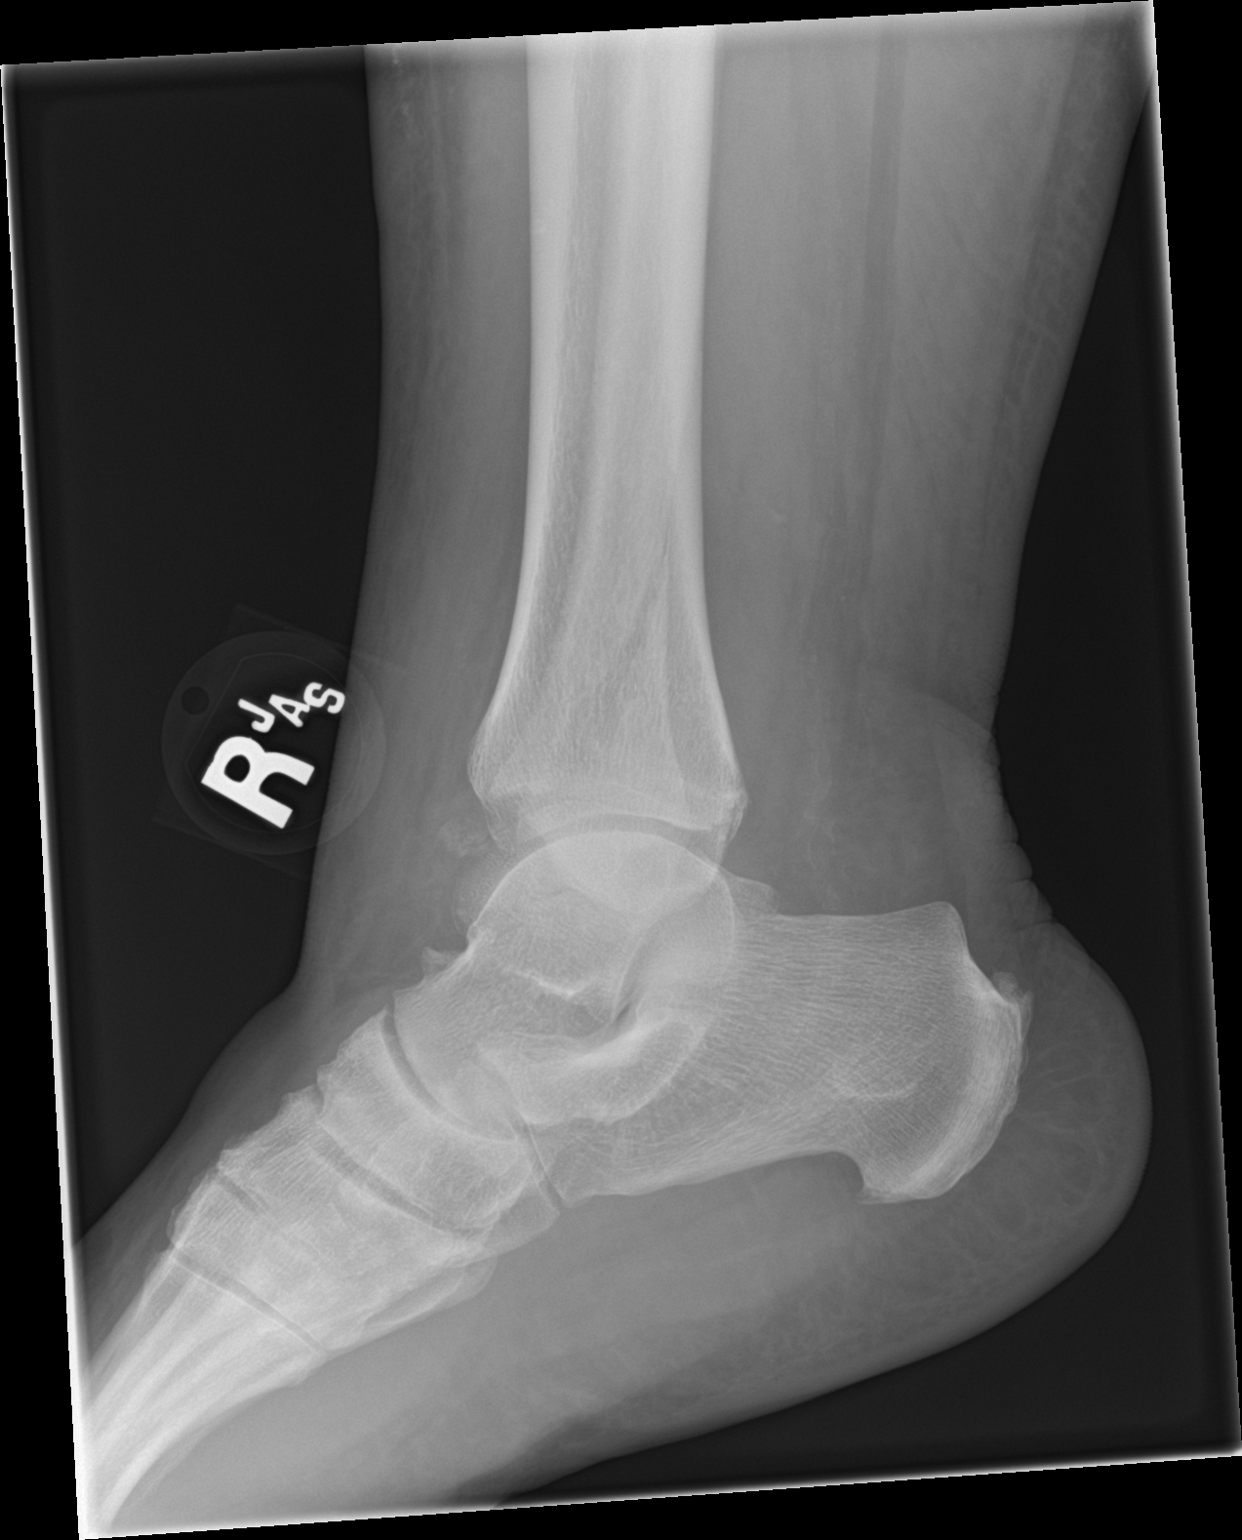

[3 of 3 positions shown; findings below may reference images not displayed]

FINDINGS: There is an oblique fracture of the distal fibula extending from the
meta diaphysis to the metaphysis. The distal fracture component is
displaced laterally by 4 mm. The talus has subluxed laterally also
by 4 mm. No medial malleolar fractures seen. There is a subtle
fracture of the posterior malleolus, without significant
displacement. Small ill-defined bone fragments are seen anterior to
the tibial articular surface on the lateral view.

There is diffuse surrounding soft tissue swelling.
IMPRESSION: 1. Oblique fracture of the distal fibula. Fracture is displaced
laterally, and the talus is subluxed laterally, 4 mm.
2. Fracture of the posterior margin of the distal tibia, the
posterior malleolus, without significant displacement. Per

## 2016-08-04 DIAGNOSIS — L309 Dermatitis, unspecified: Secondary | ICD-10-CM | POA: Diagnosis not present

## 2016-08-21 ENCOUNTER — Ambulatory Visit: Payer: Federal, State, Local not specified - PPO | Admitting: Family Medicine

## 2016-08-28 DIAGNOSIS — L293 Anogenital pruritus, unspecified: Secondary | ICD-10-CM | POA: Diagnosis not present

## 2016-08-28 DIAGNOSIS — N760 Acute vaginitis: Secondary | ICD-10-CM | POA: Diagnosis not present

## 2016-11-20 DIAGNOSIS — Z1389 Encounter for screening for other disorder: Secondary | ICD-10-CM | POA: Diagnosis not present

## 2016-11-20 DIAGNOSIS — Z130 Encounter for screening for diseases of the blood and blood-forming organs and certain disorders involving the immune mechanism: Secondary | ICD-10-CM | POA: Diagnosis not present

## 2016-11-20 DIAGNOSIS — N898 Other specified noninflammatory disorders of vagina: Secondary | ICD-10-CM | POA: Diagnosis not present

## 2016-11-20 DIAGNOSIS — Z01419 Encounter for gynecological examination (general) (routine) without abnormal findings: Secondary | ICD-10-CM | POA: Diagnosis not present

## 2016-11-20 DIAGNOSIS — Z6841 Body Mass Index (BMI) 40.0 and over, adult: Secondary | ICD-10-CM | POA: Diagnosis not present

## 2016-11-20 DIAGNOSIS — N9089 Other specified noninflammatory disorders of vulva and perineum: Secondary | ICD-10-CM | POA: Diagnosis not present

## 2016-11-24 ENCOUNTER — Encounter: Payer: Self-pay | Admitting: Internal Medicine

## 2016-12-04 ENCOUNTER — Ambulatory Visit: Payer: Federal, State, Local not specified - PPO | Admitting: Internal Medicine

## 2016-12-14 ENCOUNTER — Encounter: Payer: Self-pay | Admitting: Internal Medicine

## 2016-12-14 ENCOUNTER — Ambulatory Visit: Payer: Federal, State, Local not specified - PPO | Admitting: Internal Medicine

## 2016-12-14 ENCOUNTER — Other Ambulatory Visit (INDEPENDENT_AMBULATORY_CARE_PROVIDER_SITE_OTHER): Payer: Federal, State, Local not specified - PPO

## 2016-12-14 VITALS — BP 128/82 | HR 97 | Temp 98.3°F | Ht 60.0 in | Wt 240.2 lb

## 2016-12-14 DIAGNOSIS — E119 Type 2 diabetes mellitus without complications: Secondary | ICD-10-CM

## 2016-12-14 DIAGNOSIS — R110 Nausea: Secondary | ICD-10-CM

## 2016-12-14 DIAGNOSIS — M79672 Pain in left foot: Secondary | ICD-10-CM | POA: Diagnosis not present

## 2016-12-14 DIAGNOSIS — J302 Other seasonal allergic rhinitis: Secondary | ICD-10-CM

## 2016-12-14 DIAGNOSIS — J019 Acute sinusitis, unspecified: Secondary | ICD-10-CM | POA: Insufficient documentation

## 2016-12-14 LAB — CBC
HCT: 36.4 % (ref 36.0–46.0)
Hemoglobin: 12.4 g/dL (ref 12.0–15.0)
MCHC: 33.9 g/dL (ref 30.0–36.0)
MCV: 89 fl (ref 78.0–100.0)
Platelets: 259 10*3/uL (ref 150.0–400.0)
RBC: 4.09 Mil/uL (ref 3.87–5.11)
RDW: 13.1 % (ref 11.5–15.5)
WBC: 10.7 10*3/uL — AB (ref 4.0–10.5)

## 2016-12-14 LAB — LIPID PANEL
Cholesterol: 165 mg/dL (ref 0–200)
HDL: 40.3 mg/dL (ref >39.00)
LDL CALC: 95 mg/dL (ref 0–99)
NONHDL: 125.11
Total CHOL/HDL Ratio: 4
Triglycerides: 152 mg/dL — ABNORMAL HIGH (ref 0.0–149.0)
VLDL: 30.4 mg/dL (ref 0.0–40.0)

## 2016-12-14 LAB — COMPREHENSIVE METABOLIC PANEL
ALBUMIN: 3.9 g/dL (ref 3.5–5.2)
ALT: 27 U/L (ref 0–35)
AST: 21 U/L (ref 0–37)
Alkaline Phosphatase: 101 U/L (ref 39–117)
BUN: 14 mg/dL (ref 6–23)
CHLORIDE: 99 meq/L (ref 96–112)
CO2: 28 mEq/L (ref 19–32)
CREATININE: 1.04 mg/dL (ref 0.40–1.20)
Calcium: 9.6 mg/dL (ref 8.4–10.5)
GFR: 72.46 mL/min (ref >60.00)
Glucose, Bld: 283 mg/dL — ABNORMAL HIGH (ref 70–99)
Potassium: 4.1 mEq/L (ref 3.5–5.1)
SODIUM: 135 meq/L (ref 135–145)
TOTAL PROTEIN: 7.3 g/dL (ref 6.0–8.3)
Total Bilirubin: 0.3 mg/dL (ref 0.2–1.2)

## 2016-12-14 LAB — HEMOGLOBIN A1C: HEMOGLOBIN A1C: 11.5 % — AB (ref 4.6–6.5)

## 2016-12-14 MED ORDER — FLUTICASONE PROPIONATE 50 MCG/ACT NA SUSP: 2.0000 | Freq: Every day | NASAL | 5 refills | Status: DC

## 2016-12-14 MED ORDER — SCOPOLAMINE 1 MG/3DAYS TD PT72: 1.0000 | MEDICATED_PATCH | TRANSDERMAL | 0 refills | Status: DC

## 2016-12-14 NOTE — Progress Notes (Signed)
   Subjective:    Patient ID: [name], female    DOB: 1965-11-13, 51 y.o.   MRN: [id number]  HPI The patient is a 51 YO female coming in for follow up of diabetes (not well controlled, off meds previously, now taking metformin only, not on ARB, not known to be complicated), and heel pain (left heel, started about 1 week ago, denies falls or overuse, taking aleve and tylenol with good relief), and going on a cruise (wants to have protection against seasickness, taking her first cruise next week). Denies other new problems. Diet is okay and not exercising.  Review of Systems  Constitutional: Negative.   HENT: Negative.   Eyes: Negative.   Respiratory: Negative for cough, chest tightness and shortness of breath.   Cardiovascular: Negative for chest pain, palpitations and leg swelling.  Gastrointestinal: Negative for abdominal distention, abdominal pain, constipation, diarrhea, nausea and vomiting.  Musculoskeletal: Positive for arthralgias and myalgias.  Skin: Negative.   Neurological: Negative.   Psychiatric/Behavioral: Negative.       Objective:   Physical Exam  Constitutional: She is oriented to person, place, and time. She appears well-developed and well-nourished.  Overweight  HENT:  Head: Normocephalic and atraumatic.  Eyes: EOM are normal.  Neck: Normal range of motion.  Cardiovascular: Normal rate and regular rhythm.  Pulmonary/Chest: Effort normal and breath sounds normal. No respiratory distress. She has no wheezes. She has no rales.  Abdominal: Soft. Bowel sounds are normal. She exhibits no distension. There is no tenderness. There is no rebound.  Musculoskeletal: She exhibits tenderness. She exhibits no edema.  Pain over the left achilles tendon, pain with plantar flexion and extension, no defect or tear appreciated, no edema, no calf tenderness  Neurological: She is alert and oriented to person, place, and time. Coordination normal.  Skin: Skin is warm and dry.    Vitals:   12/14/16 0825  BP: 128/82  Pulse: 97  Temp: 98.3 F (36.8 C)  TempSrc: Oral  SpO2: 100%  Weight: 240 lb 4 oz (109 kg)  Height: 5' (1.524 m)      Assessment & Plan:

## 2016-12-14 NOTE — Patient Instructions (Signed)
We will check the labs today.  We have sent in the flonase.   We have given you the stretching exercises.   We have sent in the seasick patches to  Put on the morning of the cruise.   Achilles Tendinitis Rehab Ask your health care provider which exercises are safe for you. Do exercises exactly as told by your health care provider and adjust them as directed. It is normal to feel mild stretching, pulling, tightness, or discomfort as you do these exercises, but you should stop right away if you feel sudden pain or your pain gets worse. Do not begin these exercises until told by your health care provider. Stretching and range of motion exercises These exercises warm up your muscles and joints and improve the movement and flexibility of your ankle. These exercises also help to relieve pain, numbness, and tingling. Exercise A: Standing wall calf stretch, knee straight  1. Stand with your hands against a wall. 2. Extend your __________ leg behind you and bend your front knee slightly. Keep both of your heels on the floor. 3. Point the toes of your back foot slightly inward. 4. Keeping your heels on the floor and your back knee straight, shift your weight toward the wall. Do not allow your back to arch. You should feel a gentle stretch in your calf. 5. Hold this position for seconds. Repeat __________ times. Complete this stretch __________ times per day. Exercise B: Standing wall calf stretch, knee bent 1. Stand with your hands against a wall. 2. Extend your __________ leg behind you, and bend your front knee slightly. Keep both of your heels on the floor. 3. Point the toes of your back foot slightly inward. 4. Keeping your heels on the floor, unlock your back knee so that it is bent. You should feel a gentle stretch deep in your calf. 5. Hold this position for __________ seconds. Repeat __________ times. Complete this stretch __________ times per day. Strengthening exercises These exercises  build strength and control of your ankle. Endurance is the ability to use your muscles for a long time, even after they get tired. Exercise C: Plantar flexion with band  1. Sit on the floor with your __________ leg extended. You may put a pillow under your calf to give your foot more room to move. 2. Loop a rubber exercise band or tube around the ball of your __________ foot. The ball of your foot is on the walking surface, right under your toes. The band or tube should be slightly tense when your foot is relaxed. If the band or tube slips, you can put on your shoe or put a washcloth between the band and your foot to help it stay in place. 3. Slowly point your toes downward, pushing them away from you. 4. Hold this position for __________ seconds. 5. Slowly release the tension in the band or tube, controlling smoothly until your foot is back to the starting position. Repeat __________ times. Complete this exercise __________ times per day. Exercise D: Heel raise with eccentric lower  1. Stand on a step with the balls of your feet. The ball of your foot is on the walking surface, right under your toes. ? Do not put your heels on the step. ? For balance, rest your hands on the wall or on a railing. 2. Rise up onto the balls of your feet. 3. Keeping your heels up, shift all of your weight to your __________ leg and pick up your other  leg. 4. Slowly lower your __________ leg so your heel drops below the level of the step. 5. Put down your foot. If told by your health care provider, build up to:  3 sets of 15 repetitions while keeping your knees straight.  3 sets of 15 repetitions while keeping your knees bent as far as told by your health care provider.  Complete this exercise __________ times per day. If this exercise is too easy, try doing it while wearing a backpack with weights in it. Balance exercises These exercises improve or maintain your balance. Balance is important in preventing  falls. Exercise E: Single leg stand 1. Without shoes, stand near a railing or in a door frame. Hold on to the railing or door frame as needed. 2. Stand on your __________ foot. Keep your big toe down on the floor and try to keep your arch lifted. 3. Hold this position for __________ seconds. Repeat __________ times. Complete this exercise __________ times per day. If this exercise is too easy, you can try it with your eyes closed or while standing on a pillow. This information is not intended to replace advice given to you by your health care provider. Make sure you discuss any questions you have with your health care provider. Document Released: 08/06/2004 Document Revised: 09/12/2015 Document Reviewed: 09/11/2014 Elsevier Interactive Patient Education  [employer].

## 2016-12-14 NOTE — Assessment & Plan Note (Signed)
Likely tendinitis, encouraged to keep taking aleve with tylenol for pain. Given stretching exercises. No fall or concern for fracture to suggest need for imaging today. Given uncontrolled diabetes oral steroids not an option for treatment.

## 2016-12-14 NOTE — Assessment & Plan Note (Signed)
Checking HgA1c, taking metformin BID at this time. Last Hga1c 11 and not controlled, not complicated. Not on ACE-I or ARB. Talked to her about healthier eating even on vacation to help. Weight down about 5 pounds since last visit.

## 2016-12-14 NOTE — Assessment & Plan Note (Signed)
She is concerned about nausea with cruise. Rx for transderm patch to help avoid nausea while cruising.

## 2016-12-17 ENCOUNTER — Telehealth: Payer: Self-pay | Admitting: Internal Medicine

## 2016-12-17 NOTE — Telephone Encounter (Signed)
Copied from [place] [id number]. Topic: Quick Communication - See Telephone Encounter >> Dec 17, 2016  9:59 AM Ahmed Prima L wrote: CRM for notification. See Telephone encounter for:  Pt said she seen Dr.Crawford on Monday & she said that she called her in the wrong blood pressure pill. She needs her to call her in the lisinopril with the hctz. Pharmacy is [facility] on [address].  Please call back. 12/17/16.

## 2016-12-17 NOTE — Telephone Encounter (Signed)
See note; pt. Is of the understanding that Dr. Sharlet Salina planned to put her on Lisinopril with HCTZ; noted that plain HCTZ was ordered.  Please notify pt. of plan.

## 2016-12-17 NOTE — Telephone Encounter (Signed)
LVM informing patient that Blood pressure medication was not changed at visit or sent in to pharmacy at last visit and to call back in regard.

## 2016-12-17 NOTE — Telephone Encounter (Signed)
We did not make any changes to her blood pressure medicine at the visit.

## 2016-12-18 NOTE — Telephone Encounter (Signed)
States that she came in for a visit for her medications and was told she would get a refill on all her medications. States that she talked about being put back on her lisinopril with hctz. States that she went to [place] and saw Bruce and was put on something else while you were out because of a skin rash. When she went to dermatologist they told her it was not her Bp medication causing a rash and she wanted to get back on the lsinipril with hctz.

## 2016-12-21 MED ORDER — LISINOPRIL-HYDROCHLOROTHIAZIDE 20-25 MG PO TABS: 1.0000 | ORAL_TABLET | Freq: Every day | ORAL | 3 refills | Status: DC

## 2016-12-21 NOTE — Telephone Encounter (Signed)
LVM informing patient of MD response. To call back with any questions

## 2016-12-21 NOTE — Telephone Encounter (Signed)
Called patient back and left another detailed message in regards to stop taking losartan and HCTZ and that the lisinopril/HCTZ was sent to her pharmacy. To also call back and let us know if she is interested in starting another blood sugar medication on top of Metformin BID

## 2016-12-21 NOTE — Progress Notes (Signed)
[name] [address] [place], [place] [address] Phone: [phone] Subjective:    I'm seeing this patient by the request  of:  Hoyt Koch, MD   CC: Left ankle pain  [id number]  [name] is a 51 y.o. female coming in with complaint of left ankle and heel pain.   Onset- 2 weeks ago Location- Achilles Duration- Worse in the morning after sitting for long periods of time Character- sharp, dull Aggravating factors- Flat shoes Reliving factors- aleve and tylenol Therapies tried- as above.  Severity- Patient is concerned because she is going on a cruise and wants to be able to be very active.     Past Medical History:  Diagnosis Date  . Cough    taking Sudafed as needed.Iophen C as needed also for cough.No fever  . DIAB W/UNSPEC COMP TYPE II/UNSPEC TYPE UNCNTRL 08/24/2008   takes Metformin,CInnamon,and Glipizide daily  . Eczema   . Family history of adverse reaction to anesthesia    dad slow to wake up  . GASTROESOPHAGEAL REFLUX, NO ESOPHAGITIS 03/18/2006   doesn't take any meds  . History of blood clots 8+ yrs ago   right calf  . HYPERTENSION, BENIGN SYSTEMIC 03/18/2006   takes Lisinopril-HCTZ daily  . Joint pain   . Joint swelling   . OBESITY, NOS 03/18/2006  . PONV (postoperative nausea and vomiting)   . Seasonal allergies    takes Zyrtec and Claritin daily as well as Fluticasone   Past Surgical History:  Procedure Laterality Date  . ABLATION    . CHOLECYSTECTOMY    . ORIF ANKLE FRACTURE Right 02/01/2015   Procedure: OPEN REDUCTION INTERNAL FIXATION RIGHT ANKLE FRACTURE;  Surgeon: Mcarthur Rossetti, MD;  Location: [facility];  Service: Orthopedics;  Laterality: Right;  . TUBAL LIGATION  2004   Social History   Socioeconomic History  . Marital status: Single    Spouse name: None  . Number of children: None  . Years of education: None  . Highest education level: None  Social Needs  . Financial resource  strain: None  . Food insecurity - worry: None  . Food insecurity - inability: None  . Transportation needs - medical: None  . Transportation needs - non-medical: None  Occupational History  . None  Tobacco Use  . Smoking status: Never Smoker  . Smokeless tobacco: Never Used  Substance and Sexual Activity  . Alcohol use: No  . Drug use: No  . Sexual activity: Yes    Birth control/protection: Surgical  Other Topics Concern  . None  Social History Narrative  . None   No Known Allergies Family History  Problem Relation Age of Onset  . Hypertension Mother   . Diabetes Mother   . Hypertension Father      Past medical history, social, surgical and family history all reviewed in electronic medical record.  No pertanent information unless stated regarding to the chief complaint.   Review of Systems:Review of systems updated and as accurate as of 12/22/16  No headache, visual changes, nausea, vomiting, diarrhea, constipation, dizziness, abdominal pain, skin rash, fevers, chills, night sweats, weight loss, swollen lymph nodes, body aches, joint swelling, chest pain, shortness of breath, mood changes.  Positive muscle aches  Objective  Blood pressure 122/70, pulse 95, height 5' (1.524 m), weight 246 lb (111.6 kg), SpO2 97 %. Systems examined below as of 12/22/16   General: No apparent distress alert and oriented x3 mood and affect  normal, dressed appropriately.  HEENT: Pupils equal, extraocular movements intact  Respiratory: Patient's speak in full sentences and does not appear short of breath  Cardiovascular: No lower extremity edema, non tender, no erythema  Skin: Warm dry intact with no signs of infection or rash on extremities or on axial skeleton.  Abdomen: Soft nontender  Neuro: Cranial nerves II through XII are intact, neurovascularly intact in all extremities with 2+ DTRs and 2+ pulses.  Lymph: No lymphadenopathy of posterior or anterior cervical chain or axillae bilaterally.   Gait mild antalgic gait MSK:  Non tender with full range of motion and good stability and symmetric strength and tone of shoulders, elbows, wrist, hip, knee and ankles bilaterally.   Ankle exam shows the patient does have severe pes planus bilaterally.  Left ankle though does have pain at the insertion of the Achilles.  Limited range of motion in dorsiflexion secondary to tightness.  No swelling or nodule noted.  Negative Thompson.  Neurovascularly intact distally  MSK US performed of: Ankle This study was ordered, performed, and interpreted by Charlann Boxer D.O.  Foot/Ankle:   Achilles tendon she does show some mild tightness with very small amount of nodularity.  No tearing noted.  Mild increase in hypoechoic changes.  Patient does have a retrocalcaneal bursa that seems to be calcific in nature  IMPRESSION: Mild Achilles tendinitis  Procedure 97110; 15 additional minutes spent for Therapeutic exercises as stated in above notes.  This included exercises focusing on stretching, strengthening, with significant focus on eccentric aspects.   Long term goals include an improvement in range of motion, strength, endurance as well as avoiding reinjury. Patient's frequency would include in 1-2 times a day, 3-5 times a week for a duration of 6-12 weeks. Ankle strengthening that included:  Basic range of motion exercises to allow proper full motion at ankle Stretching of the lower leg and hamstrings  Theraband exercises for the lower leg - inversion, eversion, dorsiflexion and plantarflexion each to be completed with a theraband which was given  Balance exercises to increase proprioception Weight bearing exercises to increase strength and balance  Proper technique shown and discussed handout in great detail with ATC.  All questions were discussed and answered.       Impression and Recommendations:     This case required medical decision making of moderate complexity.      Note: This dictation  was prepared with Dragon dictation along with smaller phrase technology. Any transcriptional errors that result from this process are unintentional.

## 2016-12-21 NOTE — Telephone Encounter (Signed)
Patient called [facility] requesting a call back.

## 2016-12-21 NOTE — Telephone Encounter (Signed)
Sent in lisinopril/hctz. She needs to stop taking losartan (she told us she was already not taking at visit) and hctz. Needs labs at next visit.

## 2016-12-22 ENCOUNTER — Ambulatory Visit: Payer: Federal, State, Local not specified - PPO | Admitting: Family Medicine

## 2016-12-22 ENCOUNTER — Ambulatory Visit: Payer: Self-pay

## 2016-12-22 ENCOUNTER — Encounter: Payer: Self-pay | Admitting: Family Medicine

## 2016-12-22 ENCOUNTER — Telehealth: Payer: Self-pay | Admitting: Internal Medicine

## 2016-12-22 ENCOUNTER — Other Ambulatory Visit: Payer: Self-pay

## 2016-12-22 VITALS — BP 122/70 | HR 95 | Ht 60.0 in | Wt 246.0 lb

## 2016-12-22 DIAGNOSIS — M25572 Pain in left ankle and joints of left foot: Secondary | ICD-10-CM

## 2016-12-22 DIAGNOSIS — M7662 Achilles tendinitis, left leg: Secondary | ICD-10-CM | POA: Diagnosis not present

## 2016-12-22 DIAGNOSIS — G8929 Other chronic pain: Secondary | ICD-10-CM | POA: Diagnosis not present

## 2016-12-22 MED ORDER — DICLOFENAC SODIUM 2 % TD SOLN: 2.0000 g | Freq: Two times a day (BID) | TRANSDERMAL | 3 refills | Status: DC

## 2016-12-22 MED ORDER — EMPAGLIFLOZIN 25 MG PO TABS: 25.0000 mg | ORAL_TABLET | Freq: Every day | ORAL | 3 refills | Status: DC

## 2016-12-22 NOTE — Patient Instructions (Signed)
Good to see you.  Ice 20 minutes 2 times daily. Usually after activity and before bed. pennsaid pinkie amount topically 2 times daily as needed.  Can take the tylenol and ibuprofen if you need it Duexis 3 times a day for 3 days Wear bace with walking Exercises 3 times a week.  Good shoes with rigid bottom.  Jalene Mullet, [place]rell or New balance greater then 700, other one would be HOKA [employer] orthotics "total support" online would be great otherwise a heel lift. See me again in 4-6 weeks to make sure better

## 2016-12-22 NOTE — Telephone Encounter (Signed)
Pt states that she would like a VM when something is called in for her

## 2016-12-22 NOTE — Assessment & Plan Note (Signed)
Patient does have some mild Achilles tendinitis.  Given home exercise, topical and oral anti-inflammatories prescribed to see which will be better.  Home exercises encouraged.  Patient given a brace as well as well as we discussed a heel lift in the shoe.  Follow-up again in 4-6 weeks

## 2016-12-22 NOTE — Telephone Encounter (Signed)
Copied from [place] [id number]. Topic: Quick Communication - Lab Results >> Dec 21, 2016 12:08 PM Blondell Reveal, CMA wrote: Notified patient regarding lab results.  Need to know if willing to start another medication on top of taking Metformin BID for blood sugars. Rx was sent in for lisinopril/hctz. She needs to stop taking losartan and hctz. >> Dec 21, 2016  3:08 PM Oliver Pila B wrote: Pt has been informed of information, and is interested on getting on a medication on top of the metformin if its not expensive no more than $50, contact pt if needed, also she [facility] [address] [facility], if you call in another medication on top of the metformin [facility] so that she doesn't have to go to 2 different pharmacies

## 2016-12-22 NOTE — Addendum Note (Signed)
Addended by: Pricilla Holm A on: 12/22/2016 02:58 PM   Modules accepted: Orders

## 2016-12-22 NOTE — Telephone Encounter (Signed)
Sent in [place].Take 1 pill daily. Needs follow up for diabetes in 3 months.

## 2016-12-22 NOTE — Telephone Encounter (Signed)
Patient informed. 

## 2017-01-21 ENCOUNTER — Encounter: Payer: Self-pay | Admitting: Family Medicine

## 2017-01-21 ENCOUNTER — Ambulatory Visit: Payer: Self-pay

## 2017-01-21 ENCOUNTER — Ambulatory Visit: Payer: Federal, State, Local not specified - PPO | Admitting: Family Medicine

## 2017-01-21 VITALS — BP 102/76 | HR 103 | Wt 242.0 lb

## 2017-01-21 DIAGNOSIS — M7662 Achilles tendinitis, left leg: Secondary | ICD-10-CM | POA: Diagnosis not present

## 2017-01-21 DIAGNOSIS — G8929 Other chronic pain: Secondary | ICD-10-CM | POA: Diagnosis not present

## 2017-01-21 DIAGNOSIS — M7661 Achilles tendinitis, right leg: Secondary | ICD-10-CM

## 2017-01-21 DIAGNOSIS — M25571 Pain in right ankle and joints of right foot: Principal | ICD-10-CM

## 2017-01-21 MED ORDER — IBUPROFEN-FAMOTIDINE 800-26.6 MG PO TABS: 1.0000 | ORAL_TABLET | Freq: Three times a day (TID) | ORAL | 3 refills | Status: DC

## 2017-01-21 NOTE — Progress Notes (Signed)
[name] [address] [place], [place] [address] Phone: [phone] Subjective:    I'm seeing this patient by the request  of:    CC:   [id number]  [name] is a 52 y.o. female coming in for follow up for ankle pain. She said that she bought better shoes which have helped. She continues to have pain in the left achilles area. She did take Duexis which helped during her cruise. When she is having pain it seems to be achy in character. She also tried doing the exercises but is unsure if they helped.  Onset-  Location Duration-  Character- Aggravating factors- Reliving factors-  Therapies tried-  Severity-     Past Medical History:  Diagnosis Date  . Cough    taking Sudafed as needed.Iophen C as needed also for cough.No fever  . DIAB W/UNSPEC COMP TYPE II/UNSPEC TYPE UNCNTRL 08/24/2008   takes Metformin,CInnamon,and Glipizide daily  . Eczema   . Family history of adverse reaction to anesthesia    dad slow to wake up  . GASTROESOPHAGEAL REFLUX, NO ESOPHAGITIS 03/18/2006   doesn't take any meds  . History of blood clots 8+ yrs ago   right calf  . HYPERTENSION, BENIGN SYSTEMIC 03/18/2006   takes Lisinopril-HCTZ daily  . Joint pain   . Joint swelling   . OBESITY, NOS 03/18/2006  . PONV (postoperative nausea and vomiting)   . Seasonal allergies    takes Zyrtec and Claritin daily as well as Fluticasone   Past Surgical History:  Procedure Laterality Date  . ABLATION    . CHOLECYSTECTOMY    . ORIF ANKLE FRACTURE Right 02/01/2015   Procedure: OPEN REDUCTION INTERNAL FIXATION RIGHT ANKLE FRACTURE;  Surgeon: Mcarthur Rossetti, MD;  Location: [facility];  Service: Orthopedics;  Laterality: Right;  . TUBAL LIGATION  2004   Social History   Socioeconomic History  . Marital status: Single    Spouse name: Not on file  . Number of children: Not on file  . Years of education: Not on file  . Highest education level: Not on file  Social  Needs  . Financial resource strain: Not on file  . Food insecurity - worry: Not on file  . Food insecurity - inability: Not on file  . Transportation needs - medical: Not on file  . Transportation needs - non-medical: Not on file  Occupational History  . Not on file  Tobacco Use  . Smoking status: Never Smoker  . Smokeless tobacco: Never Used  Substance and Sexual Activity  . Alcohol use: No  . Drug use: No  . Sexual activity: Yes    Birth control/protection: Surgical  Other Topics Concern  . Not on file  Social History Narrative  . Not on file   No Known Allergies Family History  Problem Relation Age of Onset  . Hypertension Mother   . Diabetes Mother   . Hypertension Father      Past medical history, social, surgical and family history all reviewed in electronic medical record.  No pertanent information unless stated regarding to the chief complaint.   Review of Systems:Review of systems updated and as accurate as of 01/21/17  No headache, visual changes, nausea, vomiting, diarrhea, constipation, dizziness, abdominal pain, skin rash, fevers, chills, night sweats, weight loss, swollen lymph nodes, body aches, joint swelling, muscle aches, chest pain, shortness of breath, mood changes.   Objective  There were no vitals taken for this visit. Systems  examined below as of 01/21/17   General: No apparent distress alert and oriented x3 mood and affect normal, dressed appropriately.  HEENT: Pupils equal, extraocular movements intact  Respiratory: Patient's speak in full sentences and does not appear short of breath  Cardiovascular: No lower extremity edema, non tender, no erythema  Skin: Warm dry intact with no signs of infection or rash on extremities or on axial skeleton.  Abdomen: Soft nontender  Neuro: Cranial nerves II through XII are intact, neurovascularly intact in all extremities with 2+ DTRs and 2+ pulses.  Lymph: No lymphadenopathy of posterior or anterior cervical  chain or axillae bilaterally.  Gait normal with good balance and coordination.  MSK:  Non tender with full range of motion and good stability and symmetric strength and tone of shoulders, elbows, wrist, hip, knee and ankles bilaterally.     Impression and Recommendations:     This case required medical decision making of moderate complexity.      Note: This dictation was prepared with Dragon dictation along with smaller phrase technology. Any transcriptional errors that result from this process are unintentional.

## 2017-01-21 NOTE — Progress Notes (Signed)
[name] [address] [name], [place] [address] Phone: [phone] Subjective:    I'm seeing this patient by the request  of:    CC: Left ankle pain follow-up  [id number]  [name] is a 52 y.o. female coming in with complaint of left ankle pain.  Found to have an Achilles tendinitis.  States that she is feeling approximately 50% better.  States that the pain is not as severe as what it was previously.  Still though does have discomfort.  Patient got new shoes but has only been wearing them intermittently.  When she does wear patient denies any new symptoms.     Past Medical History:  Diagnosis Date  . Cough    taking Sudafed as needed.Iophen C as needed also for cough.No fever  . DIAB W/UNSPEC COMP TYPE II/UNSPEC TYPE UNCNTRL 08/24/2008   takes Metformin,CInnamon,and Glipizide daily  . Eczema   . Family history of adverse reaction to anesthesia    dad slow to wake up  . GASTROESOPHAGEAL REFLUX, NO ESOPHAGITIS 03/18/2006   doesn't take any meds  . History of blood clots 8+ yrs ago   right calf  . HYPERTENSION, BENIGN SYSTEMIC 03/18/2006   takes Lisinopril-HCTZ daily  . Joint pain   . Joint swelling   . OBESITY, NOS 03/18/2006  . PONV (postoperative nausea and vomiting)   . Seasonal allergies    takes Zyrtec and Claritin daily as well as Fluticasone   Past Surgical History:  Procedure Laterality Date  . ABLATION    . CHOLECYSTECTOMY    . ORIF ANKLE FRACTURE Right 02/01/2015   Procedure: OPEN REDUCTION INTERNAL FIXATION RIGHT ANKLE FRACTURE;  Surgeon: Mcarthur Rossetti, MD;  Location: [facility];  Service: Orthopedics;  Laterality: Right;  . TUBAL LIGATION  2004   Social History   Socioeconomic History  . Marital status: Single    Spouse name: None  . Number of children: None  . Years of education: None  . Highest education level: None  Social Needs  . Financial resource strain: None  . Food insecurity - worry: None  .  Food insecurity - inability: None  . Transportation needs - medical: None  . Transportation needs - non-medical: None  Occupational History  . None  Tobacco Use  . Smoking status: Never Smoker  . Smokeless tobacco: Never Used  Substance and Sexual Activity  . Alcohol use: No  . Drug use: No  . Sexual activity: Yes    Birth control/protection: Surgical  Other Topics Concern  . None  Social History Narrative  . None   No Known Allergies Family History  Problem Relation Age of Onset  . Hypertension Mother   . Diabetes Mother   . Hypertension Father      Past medical history, social, surgical and family history all reviewed in electronic medical record.  No pertanent information unless stated regarding to the chief complaint.   Review of Systems:Review of systems updated and as accurate as of 01/21/17  No headache, visual changes, nausea, vomiting, diarrhea, constipation, dizziness, abdominal pain, skin rash, fevers, chills, night sweats, weight loss, swollen lymph nodes, body aches, joint swelling, muscle aches, chest pain, shortness of breath, mood changes.   Objective  Blood pressure 102/76, pulse (!) 103, weight 242 lb (109.8 kg), SpO2 99 %. Systems examined below as of 01/21/17   General: No apparent distress alert and oriented x3 mood and affect normal, dressed appropriately.  HEENT: Pupils equal, extraocular  movements intact  Respiratory: Patient's speak in full sentences and does not appear short of breath  Cardiovascular: No lower extremity edema, non tender, no erythema  Skin: Warm dry intact with no signs of infection or rash on extremities or on axial skeleton.  Abdomen: Soft nontender  Neuro: Cranial nerves II through XII are intact, neurovascularly intact in all extremities with 2+ DTRs and 2+ pulses.  Lymph: No lymphadenopathy of posterior or anterior cervical chain or axillae bilaterally.  Gait normal with good balance and coordination.  MSK:  Non tender with  full range of motion and good stability and symmetric strength and tone of shoulders, elbows, wrist, hip, knee bilaterally.  Foot exam shows the patient does have pes planus bilaterally left greater than right.  Overpronation of the hindfoot.  More tenderness over the insertion of the Achilles as well as still over the retrocalcaneal area.  Mild tightness of the posterior cord.  Neurovascularly intact distally with full strength.  Limited musculoskeletal ultrasound was performed and interpreted by Lyndal Pulley  Limited ultrasound of patient's left Achilles shows the patient does have still some mild hypoechoic changes as well as a retrocalcaneal bursitis.  No true tearing appreciated and no increasing Doppler flow.      Impression and Recommendations:     This case required medical decision making of moderate complexity.      Note: This dictation was prepared with Dragon dictation along with smaller phrase technology. Any transcriptional errors that result from this process are unintentional.

## 2017-01-21 NOTE — Patient Instructions (Signed)
Good to see you  [name] is your friend.  We will get you into physical therapy  Duexis 3 times a day and they will be calling you  Happy New Year!  See me again in 4 weeks

## 2017-01-21 NOTE — Assessment & Plan Note (Signed)
Mild improvement at this time.  We will send a formal physical therapy to help with home exercises.  Patient did not respond well to the topical anti-inflammatories and sent in oral anti-inflammatories.  We discussed icing regimen.  Follow-up again with me in 4-6 weeks

## 2017-02-19 ENCOUNTER — Ambulatory Visit: Payer: Federal, State, Local not specified - PPO | Admitting: Family Medicine

## 2017-03-08 LAB — HM DIABETES EYE EXAM

## 2017-03-15 ENCOUNTER — Encounter: Payer: Self-pay | Admitting: Family Medicine

## 2017-03-23 ENCOUNTER — Encounter: Payer: Self-pay | Admitting: Internal Medicine

## 2017-03-23 NOTE — Progress Notes (Signed)
Abstracted and sent to scan  

## 2017-04-26 IMAGING — MG STEREOTACTIC CORE NEEDLE BIOPSY
1 series · 1 of 1 positions shown · non-contrast
Comparison: Previous exams.

ADDENDUM:
Pathology revealed SCLEROSING PAPILLARY LESION WITH USUAL DUCTAL
HYPERPLASIA AND ASSOCIATED CALCIFICATION of the upper outer Left
breast, with excision recommended. This was found to be concordant
by Dr. Hiroko Rodgers. Pathology results were discussed with the patient
by telephone. The patient reported doing well after the biopsy with
tenderness and minimal bleeding at the site. Post biopsy
instructions and care were reviewed and questions were answered. The
patient was encouraged to call [REDACTED] for any additional concerns. Surgical consultation has been
arranged with Dr. Abirou Coiffure at [REDACTED] on
November 18, 2015.

Pathology results reported by Dalvi Dasa, RN on 11/01/2015.
CLINICAL DATA: Patient status post stereotactic guided core needle
biopsy left breast calcifications.
EXAM:
LEFT BREAST STEREOTACTIC CORE NEEDLE BIOPSY

[L SPECIMEN]
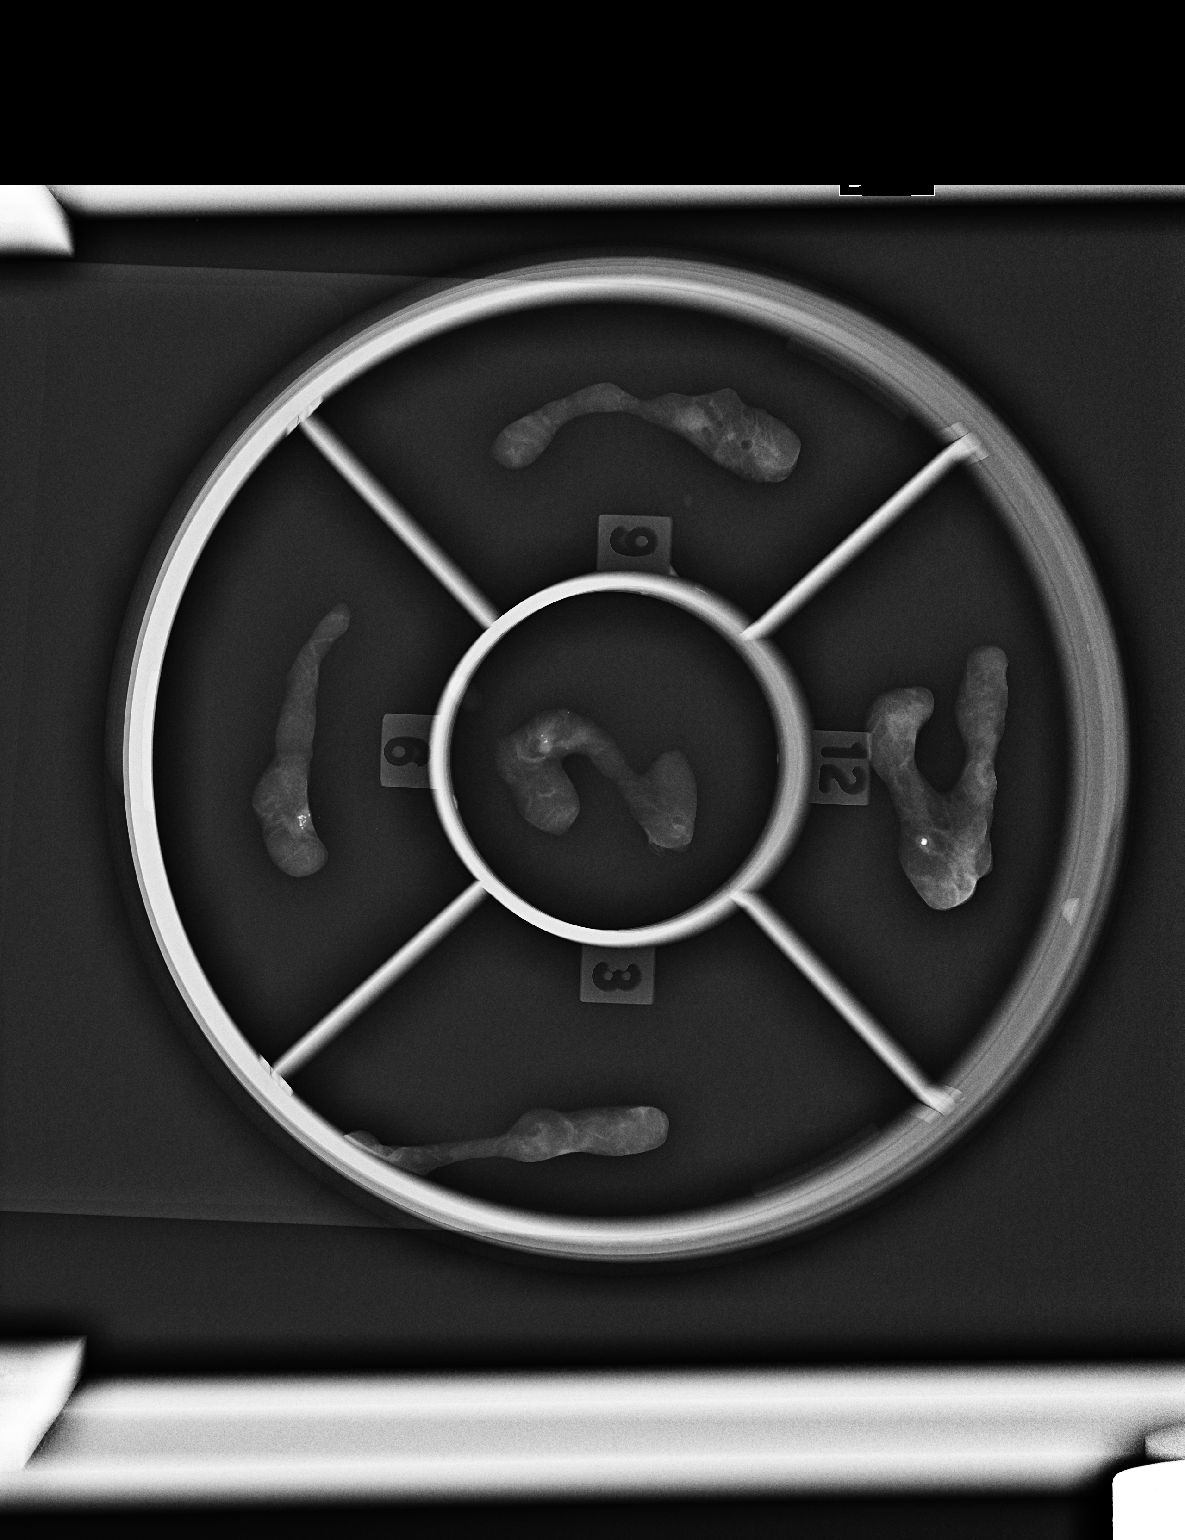

[1 of 1 positions shown; findings below may reference images not displayed]



Using sterile technique and 1% Lidocaine as local anesthetic, under
stereotactic guidance, a 9 gauge vacuum assisted core needle biopsy
device was used to perform core needle biopsy of calcifications in
the upper-outer left breast using a cranial approach. Specimen
radiograph was performed showing calcifications. Specimens with
calcifications are identified for pathology.

At the conclusion of the procedure, a cylinder-shaped tissue marker
clip was deployed into the biopsy cavity. Follow-up 2-view mammogram
was performed and dictated separately.
IMPRESSION: Stereotactic-guided biopsy of left breast calcifications. No
apparent complications.

## 2017-08-06 ENCOUNTER — Ambulatory Visit: Payer: Federal, State, Local not specified - PPO | Admitting: Internal Medicine

## 2017-08-06 ENCOUNTER — Encounter: Payer: Self-pay | Admitting: Internal Medicine

## 2017-08-06 ENCOUNTER — Telehealth: Payer: Self-pay

## 2017-08-06 VITALS — BP 100/60 | HR 113 | Temp 98.7°F | Ht 60.0 in | Wt 241.0 lb

## 2017-08-06 DIAGNOSIS — E1165 Type 2 diabetes mellitus with hyperglycemia: Secondary | ICD-10-CM

## 2017-08-06 LAB — POCT GLYCOSYLATED HEMOGLOBIN (HGB A1C): Hemoglobin A1C: 11.9 % — AB (ref 4.0–5.6)

## 2017-08-06 MED ORDER — TRIAMCINOLONE ACETONIDE 0.5 % EX OINT: TOPICAL_OINTMENT | CUTANEOUS | 0 refills | Status: AC

## 2017-08-06 MED ORDER — LISINOPRIL-HYDROCHLOROTHIAZIDE 20-25 MG PO TABS: 1.0000 | ORAL_TABLET | Freq: Every day | ORAL | 3 refills | Status: DC

## 2017-08-06 MED ORDER — BLOOD GLUCOSE METER KIT: PACK | 0 refills | Status: AC

## 2017-08-06 MED ORDER — METFORMIN HCL 1000 MG PO TABS: ORAL_TABLET | ORAL | 3 refills | Status: DC

## 2017-08-06 MED ORDER — INSULIN GLARGINE-LIXISENATIDE 100-33 UNT-MCG/ML ~~LOC~~ SOPN: 20.0000 [IU] | PEN_INJECTOR | Freq: Every day | SUBCUTANEOUS | 3 refills | Status: DC

## 2017-08-06 NOTE — Progress Notes (Signed)
   Subjective:    Patient ID: [name], female    DOB: 01/31/1965, 52 y.o.   MRN: [id number]  HPI The patient is a 52 YO female coming in for uncontrolled diabetes follow up. She is only taking metformin 1000 mg BID currently. At our last visit we did start jardiance. She got this and took it for 3-4 days and got rash in her hair and needed to stop taking it. She has been under stress and is not sure they will be doing well. She denies new numbness or weakness. She denies chest pains. Eye exam Feb without retinopathy.   Review of Systems  Constitutional: Negative.   HENT: Negative.   Eyes: Negative.   Respiratory: Negative for cough, chest tightness and shortness of breath.   Cardiovascular: Negative for chest pain, palpitations and leg swelling.  Gastrointestinal: Negative for abdominal distention, abdominal pain, constipation, diarrhea, nausea and vomiting.  Musculoskeletal: Negative.   Skin: Negative.   Neurological: Negative.   Psychiatric/Behavioral: Negative.       Objective:   Physical Exam  Constitutional: She is oriented to person, place, and time. She appears well-developed and well-nourished.  HENT:  Head: Normocephalic and atraumatic.  Eyes: EOM are normal.  Neck: Normal range of motion.  Cardiovascular: Normal rate and regular rhythm.  Pulmonary/Chest: Effort normal and breath sounds normal. No respiratory distress. She has no wheezes. She has no rales.  Abdominal: Soft. Bowel sounds are normal. She exhibits no distension. There is no tenderness. There is no rebound.  Musculoskeletal: She exhibits no edema.  Neurological: She is alert and oriented to person, place, and time. Coordination normal.  Skin: Skin is warm and dry.  Psychiatric: She has a normal mood and affect.   Vitals:   08/06/17 1333  BP: 100/60  Pulse: (!) 113  Temp: 98.7 F (37.1 C)  TempSrc: Oral  SpO2: 97%  Weight: 241 lb (109.3 kg)  Height: 5' (1.524 m)   POC HgA1c 11.9      Assessment & Plan:

## 2017-08-06 NOTE — Telephone Encounter (Signed)
Contacted patient will start a PA on the medication to see if we can get it covered. Patient is aware if it comes back not covered to call insurance and see what alternatives they do cover. Patient stated understanding

## 2017-08-06 NOTE — Assessment & Plan Note (Signed)
POC HgA1c 11.9 on metformin 1000 mg BID. She stopped taking jardiance due to side effect about 3 days after starting. Will add soliqua with glucometer and instructions given on titration schedule to fasting sugars

## 2017-08-06 NOTE — Telephone Encounter (Signed)
Copied from [place] [id number]. Topic: General - Other >> Aug 06, 2017  2:59 PM Yvette Rack wrote: Reason for CRM: pt calling back stating that the [facility] isn't covered under her insurance and would like something else the medicine cost over $800

## 2017-08-06 NOTE — Patient Instructions (Addendum)
We will have you keep taking metformin 1 pill twice a day.   The HgA1c is 11.9 today which is very high. This is dangerously high for your long term health.   I have sent in a medicine called soliqua which is a pen that you will use which has a combination of medications in it including a medicine to help with cravings and weight loss as well as insulin to help bring the sugars down.   Start off using 20 units daily. Check your sugars in the morning. After 3 days if your sugar is more than 150 in the morning increase by 3 units. Do this every 3 days until your sugars are between 100-150 in the morning consistently and then let us know the amount that you need.   We have sent in a meter to your pharmacy so that you can start checking your sugars every morning before eating. Bring this to your next visit and every visit when we will discuss your diabetes.

## 2017-08-10 ENCOUNTER — Telehealth: Payer: Self-pay

## 2017-08-10 NOTE — Telephone Encounter (Signed)
PA started on CoverMyMeds KEY: [id number]

## 2017-08-13 ENCOUNTER — Telehealth: Payer: Self-pay

## 2017-08-13 ENCOUNTER — Other Ambulatory Visit: Payer: Self-pay | Admitting: Internal Medicine

## 2017-08-13 NOTE — Telephone Encounter (Signed)
Called patient the PA was denied and patient will call insurance to see what they will cover and another sample is ready on Side a in fridge with patients name on it.

## 2017-08-31 ENCOUNTER — Other Ambulatory Visit: Payer: Self-pay | Admitting: Internal Medicine

## 2017-08-31 NOTE — Telephone Encounter (Signed)
The medications that her insurance will cover are: trulicity victoza ovepic   Does not want anything that is in the [place] family. States that [place] made her hair fall out.  CB#: [phone]

## 2017-08-31 NOTE — Telephone Encounter (Signed)
We need to know the dosing she was taking of soliqua (she was given titration instructions) so we can send in the right medicines. We will have to send in 2 medicines to replace this (as this medicine has 2 medicines in it).

## 2017-08-31 NOTE — Telephone Encounter (Signed)
LVM for patient to call back and let us know what her current dosage of Willeen Niece is and to inform us of what her sugar levels have been

## 2017-08-31 NOTE — Telephone Encounter (Signed)
LVM informing patient to call her insurance to see what alternative to [facility] that they cover and to call back and let us know

## 2017-08-31 NOTE — Telephone Encounter (Signed)
Patient called, left VM to return call to the office with the information requested by Dr. Sharlet Salina in reference to [facility] and her blood sugar levels, see notes below for details.

## 2017-08-31 NOTE — Telephone Encounter (Signed)
Copied from [place] [id number]. Topic: Quick Communication - Rx Refill/Question >> Aug 31, 2017  8:39 AM Scherrie Gerlach wrote: Medication: Insulin Glargine-Lixisenatide (SOLIQUA) 100-33 UNT-MCG/ML SOPN  Pt states her insurance will not cover this med.  Pt is going to run out of the samples Dr Sharlet Salina provided and would like to know what she should do now? Pt states ok to leave message.   [facility] #[id number] - [place], [place] [phone] (Phone) [phone] (Fax)

## 2017-09-01 NOTE — Telephone Encounter (Signed)
Patient reported the following:  100/33 of the [place], and the blood sugars are running between 160-180.   Patient will be leaving town tomorrow and she is about out of the [place] that she was given.

## 2017-09-02 ENCOUNTER — Telehealth: Payer: Self-pay | Admitting: Internal Medicine

## 2017-09-02 NOTE — Telephone Encounter (Unsigned)
Copied from [place] [id number]. Topic: Quick Communication - See Telephone Encounter >> Sep 02, 2017 10:20 AM Blondell Reveal, CMA wrote: CRM for notification. See Telephone encounter for: 09/01/17.  PEC can give information from telephone encounter dated above Patient to call back and let us know what dose of medication she is giving herself. What she is dialing pen to before giving the injection

## 2017-09-02 NOTE — Telephone Encounter (Signed)
This is not dosing information. The medication name is soliqua 100/33. We need to know how many units the pen is dialed to when she is giving herself the shot. This is the dose of the medication.

## 2017-09-02 NOTE — Telephone Encounter (Signed)
LVM for patient to call back letting us know what units she is using (what she is dialing pen to)

## 2017-09-15 NOTE — Telephone Encounter (Signed)
Patient states that she is dialing the pen to 20.

## 2017-09-17 MED ORDER — INSULIN GLARGINE 100 UNIT/ML SOLOSTAR PEN: 20.0000 [IU] | PEN_INJECTOR | Freq: Every day | SUBCUTANEOUS | 11 refills | Status: DC

## 2017-09-17 MED ORDER — DULAGLUTIDE 1.5 MG/0.5ML ~~LOC~~ SOAJ: 1.5000 mg | SUBCUTANEOUS | 11 refills | Status: DC

## 2017-09-17 NOTE — Addendum Note (Signed)
Addended by: Pricilla Holm A on: 09/17/2017 01:53 PM   Modules accepted: Orders

## 2017-09-17 NOTE — Telephone Encounter (Signed)
Patient informed of MD response  

## 2017-09-17 NOTE — Telephone Encounter (Signed)
Patient called back that she is dialing the pen to 20.  She is awaiting medication please advise thanks.  Patients call back number [phone]

## 2017-09-17 NOTE — Telephone Encounter (Signed)
We have sent in the replacements. She will need to take lantus daily 20 units as well as trulicity 1 pen weekly. Continue to monitor sugars daily in the morning and call for dosing adjustment for any sugars >200. Come back for a visit in about 1 month for dosing adjustment.

## 2017-10-21 ENCOUNTER — Telehealth: Payer: Self-pay | Admitting: Internal Medicine

## 2017-10-21 NOTE — Telephone Encounter (Signed)
Only checking sugars in the morning because she cant check at work. States that between 11-1 will start to feel bad light headed. States she is eating when taking that medication. Protein shake and breakfast bar for breakfast. Has a snack about 11 (pack of crackers) and then has lunch at 1pm. Taking 20 units daily 20 units daily

## 2017-10-21 NOTE — Telephone Encounter (Signed)
We cannot know if we need to change dosage without knowing her sugars. If she can check on an off work day when she feels bad we can advise her appropriately.

## 2017-10-21 NOTE — Telephone Encounter (Signed)
What are sugar readings when they are dropping low? Is she eating when taking these medications? Doses?

## 2017-10-21 NOTE — Telephone Encounter (Signed)
LVM with MD response and for patient to call back if she had any questions

## 2017-10-21 NOTE — Telephone Encounter (Signed)
Copied from [place] [id number]. Topic: Quick Communication - Rx Refill/Question >> Oct 21, 2017 11:17 AM Burchel, Abbi R wrote: Medication: Dulaglutide (TRULICITY) 1.5 TW/2.4OR SOPN, Insulin Glargine (LANTUS SOLOSTAR) 100 UNIT/ML Solostar Pen  Pt states her sugars have been between 150-160, and she feels like her sugar may be dropping too low around mid-morning.  Please advise pt if she should adjust dosage.   [phone] ok to leave vmail

## 2017-10-22 DIAGNOSIS — M7732 Calcaneal spur, left foot: Secondary | ICD-10-CM | POA: Diagnosis not present

## 2017-10-22 DIAGNOSIS — M19072 Primary osteoarthritis, left ankle and foot: Secondary | ICD-10-CM | POA: Diagnosis not present

## 2017-10-22 DIAGNOSIS — M7662 Achilles tendinitis, left leg: Secondary | ICD-10-CM | POA: Diagnosis not present

## 2017-10-29 ENCOUNTER — Encounter: Payer: Self-pay | Admitting: Internal Medicine

## 2017-10-29 ENCOUNTER — Other Ambulatory Visit (INDEPENDENT_AMBULATORY_CARE_PROVIDER_SITE_OTHER): Payer: Federal, State, Local not specified - PPO

## 2017-10-29 ENCOUNTER — Ambulatory Visit: Payer: Federal, State, Local not specified - PPO | Admitting: Internal Medicine

## 2017-10-29 VITALS — BP 122/82 | HR 104 | Temp 98.0°F | Ht 60.0 in

## 2017-10-29 DIAGNOSIS — E1165 Type 2 diabetes mellitus with hyperglycemia: Secondary | ICD-10-CM

## 2017-10-29 DIAGNOSIS — J3089 Other allergic rhinitis: Secondary | ICD-10-CM | POA: Diagnosis not present

## 2017-10-29 LAB — COMPREHENSIVE METABOLIC PANEL
ALK PHOS: 116 U/L (ref 39–117)
ALT: 35 U/L (ref 0–35)
AST: 21 U/L (ref 0–37)
Albumin: 4.3 g/dL (ref 3.5–5.2)
BILIRUBIN TOTAL: 0.2 mg/dL (ref 0.2–1.2)
BUN: 16 mg/dL (ref 6–23)
CALCIUM: 10 mg/dL (ref 8.4–10.5)
CO2: 30 meq/L (ref 19–32)
CREATININE: 1.05 mg/dL (ref 0.40–1.20)
Chloride: 100 mEq/L (ref 96–112)
GFR: 71.4 mL/min (ref >60.00)
GLUCOSE: 127 mg/dL — AB (ref 70–99)
Potassium: 4 mEq/L (ref 3.5–5.1)
Sodium: 137 mEq/L (ref 135–145)
TOTAL PROTEIN: 8 g/dL (ref 6.0–8.3)

## 2017-10-29 LAB — LIPID PANEL
Cholesterol: 138 mg/dL (ref 0–200)
HDL: 39 mg/dL — ABNORMAL LOW (ref >39.00)
LDL Cholesterol: 71 mg/dL (ref 0–99)
NONHDL: 98.66
Total CHOL/HDL Ratio: 4
Triglycerides: 139 mg/dL (ref 0.0–149.0)
VLDL: 27.8 mg/dL (ref 0.0–40.0)

## 2017-10-29 LAB — HEMOGLOBIN A1C: Hgb A1c MFr Bld: 8.7 % — ABNORMAL HIGH (ref 4.6–6.5)

## 2017-10-29 MED ORDER — MONTELUKAST SODIUM 10 MG PO TABS: 10.0000 mg | ORAL_TABLET | Freq: Every day | ORAL | 3 refills | Status: DC

## 2017-10-29 NOTE — Patient Instructions (Signed)
We have sent in singulair to take 1 pill daily.   Keep taking the allegra.

## 2017-10-29 NOTE — Assessment & Plan Note (Signed)
Rx for singulair. Continue allegra and stop flonase due to bleeding nose to avoid nosebleeds.

## 2017-10-29 NOTE — Assessment & Plan Note (Signed)
Needs HgA1c, lipid panel. Prior around 11 with poor control and moderate to severe. Adjust meds if needed. Taking trulicity and lantus and metformin

## 2017-10-29 NOTE — Progress Notes (Signed)
   Subjective:    Patient ID: [name], female    DOB: 02-May-1965, 53 y.o.   MRN: [id number]  HPI The patient is a 52 YO female coming in for sinus symptoms. She has had symptoms since 4-5 days. She is having sinus pressure. Mild ear pressure right. Denies fevers or chills. Denies SOB. Has some cough. Worse at night. Non-productive. Overall is worsening. Taking mucinex over the counter which does help for a few hours. Taking allegra as well which helps some. She is having some blood with blowing her nose. Still using flonase. Needs follow up diabetes while here.   Review of Systems  Constitutional: Positive for activity change and fatigue. Negative for appetite change, chills, fever and unexpected weight change.  HENT: Positive for congestion, postnasal drip, rhinorrhea and sinus pressure. Negative for ear discharge, ear pain, sinus pain, sneezing, sore throat, tinnitus, trouble swallowing and voice change.        Blood when wiping nose  Eyes: Negative.   Respiratory: Positive for cough. Negative for chest tightness, shortness of breath and wheezing.   Cardiovascular: Negative.   Gastrointestinal: Negative.   Musculoskeletal: Positive for myalgias.  Neurological: Negative.       Objective:   Physical Exam  Constitutional: She is oriented to person, place, and time. She appears well-developed and well-nourished.  HENT:  Head: Normocephalic and atraumatic.  Oropharynx with redness and clear drainage, nose with swollen turbinates, TMs normal bilaterally  Eyes: EOM are normal.  Neck: Normal range of motion. No thyromegaly present.  Cardiovascular: Normal rate and regular rhythm.  Pulmonary/Chest: Effort normal and breath sounds normal. No respiratory distress. She has no wheezes. She has no rales.  Abdominal: Soft. Bowel sounds are normal. She exhibits no distension. There is no tenderness. There is no rebound.  Musculoskeletal: She exhibits tenderness. She exhibits no edema.    Lymphadenopathy:    She has no cervical adenopathy.  Neurological: She is alert and oriented to person, place, and time. Coordination normal.  Skin: Skin is warm and dry.   Vitals:   10/29/17 1051  BP: 122/82  Pulse: (!) 104  Temp: 98 F (36.7 C)  TempSrc: Oral  SpO2: 99%  Height: 5' (1.524 m)      Assessment & Plan:

## 2017-11-01 DIAGNOSIS — M71572 Other bursitis, not elsewhere classified, left ankle and foot: Secondary | ICD-10-CM | POA: Diagnosis not present

## 2017-11-01 DIAGNOSIS — M7662 Achilles tendinitis, left leg: Secondary | ICD-10-CM | POA: Diagnosis not present

## 2017-11-04 ENCOUNTER — Telehealth: Payer: Self-pay

## 2017-11-04 NOTE — Telephone Encounter (Signed)
Copied from [place] [id number]. Topic: General - Other >> Nov 04, 2017  3:41 PM Janace Aris A wrote: Reason for CRM: Pt wants to know if there is anything Dr. Sharlet Salina can prescribe for her sinus symptoms. She says that the singulair that she was initially prescribed is not doing much for her symptoms.  She is still complaining of congestion, pressure, and draining.    [phone]

## 2017-11-05 NOTE — Telephone Encounter (Signed)
Is she still taking allegra and flonase as well as singulair? I would recommend to add sinus rinses as these sound like allergy symptoms rather than sinus infection symptoms. An antibiotic would not help this.

## 2017-11-05 NOTE — Telephone Encounter (Signed)
LVM with MD response  

## 2017-11-08 NOTE — Telephone Encounter (Signed)
Pt called in she says nobody returned her call or left a VM. She says she is still experiencing these symptoms, she is doing everything Dr. Sharlet Salina has advised, and she is not feeling any better. I asked pt did she want to be seen for a office visit, she declined. She did add that her throat is now scratchy.    Pt would like a call back in regards to this.

## 2017-11-12 ENCOUNTER — Ambulatory Visit: Payer: Federal, State, Local not specified - PPO | Admitting: Internal Medicine

## 2017-11-18 ENCOUNTER — Other Ambulatory Visit: Payer: Self-pay | Admitting: Internal Medicine

## 2017-12-10 ENCOUNTER — Telehealth: Payer: Self-pay | Admitting: Internal Medicine

## 2017-12-10 NOTE — Telephone Encounter (Signed)
Copied from [place] [id number]. Topic: Quick Communication - Rx Refill/Question >> Dec 10, 2017 10:58 AM Yvette Rack wrote: Medication: Pt requests the needles that go on to the lancet  Has the patient contacted their pharmacy? yes   Preferred Pharmacy (with phone number or street name): [facility] #[id number] - [place], [place] [phone] (Phone) [phone] (Fax)  Agent: Please be advised that RX refills may take up to 3 business days. We ask that you follow-up with your pharmacy.

## 2017-12-14 MED ORDER — PEN NEEDLES 30G X 8 MM MISC: 1.0000 "pen " | Freq: Every day | 0 refills | Status: DC

## 2017-12-14 MED ORDER — INSULIN GLARGINE 100 UNIT/ML SOLOSTAR PEN: 20.0000 [IU] | PEN_INJECTOR | Freq: Every day | SUBCUTANEOUS | 11 refills | Status: DC

## 2017-12-14 NOTE — Telephone Encounter (Signed)
Pt calling to request needles to use with Lantus Solostar insulin pen. Pt states she was given samples previously and does not know the size of the pen needle that she was given. Pt requesting for the needle size not to be too thick because she states the smaller the needle is the less it hurts. Prescription for pen needles not on current medication list. Pt would like for the prescription to be sent to [facility] on [address]

## 2017-12-14 NOTE — Telephone Encounter (Signed)
Patient called to follow up ,she would like the short needles called in to the pharmacy  Medication: Pt requests the needles that go on to the lancet    Has the patient contacted their pharmacy? yes     Preferred Pharmacy (with phone number or street name): [facility] #[id number] - [place], [place] [phone] (Phone)  [phone] (Fax)    Agent: Please be advised that RX refills may take up to 3 business days. We ask that you follow-up with your pharmacy.

## 2017-12-14 NOTE — Telephone Encounter (Signed)
Refills and needles sent

## 2017-12-14 NOTE — Telephone Encounter (Signed)
Okay to refill lantus and okay to send in pen needles 30 gauge please or 32

## 2017-12-29 ENCOUNTER — Other Ambulatory Visit: Payer: Self-pay | Admitting: Internal Medicine

## 2018-02-25 ENCOUNTER — Other Ambulatory Visit (INDEPENDENT_AMBULATORY_CARE_PROVIDER_SITE_OTHER): Payer: Federal, State, Local not specified - PPO

## 2018-02-25 ENCOUNTER — Encounter: Payer: Self-pay | Admitting: Internal Medicine

## 2018-02-25 ENCOUNTER — Ambulatory Visit: Payer: Federal, State, Local not specified - PPO | Admitting: Internal Medicine

## 2018-02-25 VITALS — BP 110/68 | HR 102 | Temp 98.2°F | Ht 60.0 in | Wt 240.0 lb

## 2018-02-25 DIAGNOSIS — J302 Other seasonal allergic rhinitis: Secondary | ICD-10-CM | POA: Diagnosis not present

## 2018-02-25 DIAGNOSIS — I10 Essential (primary) hypertension: Secondary | ICD-10-CM | POA: Diagnosis not present

## 2018-02-25 DIAGNOSIS — J0110 Acute frontal sinusitis, unspecified: Secondary | ICD-10-CM | POA: Diagnosis not present

## 2018-02-25 DIAGNOSIS — E1165 Type 2 diabetes mellitus with hyperglycemia: Secondary | ICD-10-CM

## 2018-02-25 LAB — HEMOGLOBIN A1C: Hgb A1c MFr Bld: 8.1 % — ABNORMAL HIGH (ref 4.6–6.5)

## 2018-02-25 MED ORDER — FLUCONAZOLE 150 MG PO TABS: 150.0000 mg | ORAL_TABLET | Freq: Once | ORAL | 0 refills | Status: AC

## 2018-02-25 MED ORDER — FLUTICASONE PROPIONATE 50 MCG/ACT NA SUSP: 2.0000 | Freq: Every day | NASAL | 11 refills | Status: AC

## 2018-02-25 MED ORDER — BASAGLAR KWIKPEN 100 UNIT/ML ~~LOC~~ SOPN: 20.0000 [IU] | PEN_INJECTOR | Freq: Every day | SUBCUTANEOUS | 11 refills | Status: DC

## 2018-02-25 MED ORDER — AMOXICILLIN-POT CLAVULANATE 875-125 MG PO TABS: 1.0000 | ORAL_TABLET | Freq: Two times a day (BID) | ORAL | 0 refills | Status: DC

## 2018-02-25 NOTE — Progress Notes (Signed)
   Subjective:   Patient ID: [name], female    DOB: 1965-10-10, 53 y.o.   MRN: [id number]  HPI The patient is a 53 YO female coming in for several concerns including sinus problems (started 2-3 weeks ago, sinus pressure and headaches, drainage which is green and yellow, denies fevers or chills, taking claritin and singulair without relief, out of flonase currently, also using chloricedin hbp also) and diabetes (her lantus is too expensive this year with insurance and needs an alternative, she has been out for several weeks due to cost, she has called and can take basaglar or levemir or tresiba, denies low blood sugars, trying to eat better, not exercising currently) and blood pressure (is taking her lisinopril/hctz and worried about BP due to recent cold, is having headaches, denies chest pains, denies side effects)  Review of Systems  Constitutional: Positive for activity change, appetite change and chills. Negative for fatigue, fever and unexpected weight change.  HENT: Positive for congestion, postnasal drip, rhinorrhea and sinus pressure. Negative for ear discharge, ear pain, sinus pain, sneezing, sore throat, tinnitus, trouble swallowing and voice change.   Eyes: Negative.   Respiratory: Positive for cough. Negative for chest tightness, shortness of breath and wheezing.   Cardiovascular: Negative.  Negative for chest pain, palpitations and leg swelling.  Gastrointestinal: Negative.  Negative for abdominal distention, abdominal pain, constipation, diarrhea, nausea and vomiting.  Musculoskeletal: Positive for myalgias.  Skin: Negative.   Neurological: Negative.   Psychiatric/Behavioral: Negative.     Objective:  Physical Exam Constitutional:      Appearance: She is well-developed. She is obese.  HENT:     Head: Normocephalic and atraumatic.     Comments: Oropharynx with redness and clear drainage, nose with swollen turbinates, TMs normal bilaterally.  Neck:   Musculoskeletal: Normal range of motion.     Thyroid: No thyromegaly.  Cardiovascular:     Rate and Rhythm: Normal rate and regular rhythm.  Pulmonary:     Effort: Pulmonary effort is normal. No respiratory distress.     Breath sounds: Normal breath sounds. No wheezing or rales.  Abdominal:     General: Bowel sounds are normal. There is no distension.     Palpations: Abdomen is soft.     Tenderness: There is no abdominal tenderness. There is no rebound.  Musculoskeletal:        General: Tenderness present.  Lymphadenopathy:     Cervical: No cervical adenopathy.  Skin:    General: Skin is warm and dry.  Neurological:     Mental Status: She is alert and oriented to person, place, and time.     Coordination: Coordination normal.     Vitals:   02/25/18 1514  BP: 110/68  Pulse: (!) 102  Temp: 98.2 F (36.8 C)  TempSrc: Oral  SpO2: 99%  Weight: 240 lb (108.9 kg)  Height: 5' (1.524 m)    Assessment & Plan:

## 2018-02-25 NOTE — Assessment & Plan Note (Signed)
Change lantus to basaglar for financial savings, continue trulicity and metformin as well. Checking HgA1c. Adjust as needed.

## 2018-02-25 NOTE — Patient Instructions (Signed)
We can change the lantus to basaglar. This will be the same dose just a different brand.

## 2018-02-25 NOTE — Assessment & Plan Note (Signed)
Rx for augmentin and diflucan for sinus infection. Refilled flonase and recommended to continue singulair and claritin as well.

## 2018-02-25 NOTE — Assessment & Plan Note (Signed)
Recent BMP okay. BP at goal on lisinopril/hctz and will continue.

## 2018-03-01 ENCOUNTER — Telehealth: Payer: Self-pay | Admitting: Internal Medicine

## 2018-03-01 NOTE — Telephone Encounter (Unsigned)
Copied from [place] [id number]. Topic: Quick Communication - Lab Results (Clinic Use ONLY) >> Mar 01, 2018 11:09 AM Yvette Rack wrote: Pt returned call for lab results. Pt requests call back. Cb# [phone]

## 2018-04-23 ENCOUNTER — Other Ambulatory Visit: Payer: Self-pay | Admitting: Internal Medicine

## 2018-06-02 NOTE — Progress Notes (Signed)
[name] [address], [place] [address] Phone: [phone] Subjective:   [name], am serving as a scribe for Dr. Hulan Saas.   CC: Left ankle pain  [id number]  [name] is a 53 y.o. female coming in with complaint of left ankle pain. Seen in 2018 for left achilles tendonitis and in 2019 for right achilles tendonitis. Patient states that her pain in on the achilles. Pain when weight bearing for prolonged periods. Pain can be sharp or dull. Has been using IBU and Tylenol. Unable to always wear supportive shoes at work.   Orthotics Left and right transverse 250/70  Medial 200/40 right lateral 250/70     Past Medical History:  Diagnosis Date  . Cough    taking Sudafed as needed.Iophen C as needed also for cough.Hanson fever  . DIAB W/UNSPEC COMP TYPE II/UNSPEC TYPE UNCNTRL 08/24/2008   takes Metformin,CInnamon,and Glipizide daily  . Eczema   . Family history of adverse reaction to anesthesia    dad slow to wake up  . GASTROESOPHAGEAL REFLUX, Hanson ESOPHAGITIS 03/18/2006   doesn't take any meds  . History of blood clots 8+ yrs ago   right calf  . HYPERTENSION, BENIGN SYSTEMIC 03/18/2006   takes Lisinopril-HCTZ daily  . Joint pain   . Joint swelling   . OBESITY, NOS 03/18/2006  . PONV (postoperative nausea and vomiting)   . Seasonal allergies    takes Zyrtec and Claritin daily as well as Fluticasone   Past Surgical History:  Procedure Laterality Date  . ABLATION    . CHOLECYSTECTOMY    . ORIF ANKLE FRACTURE Right 02/01/2015   Procedure: OPEN REDUCTION INTERNAL FIXATION RIGHT ANKLE FRACTURE;  Surgeon: Mcarthur Rossetti, MD;  Location: [facility];  Service: Orthopedics;  Laterality: Right;  . TUBAL LIGATION  2004   Social History   Socioeconomic History  . Marital status: Single    Spouse name: Not on file  . Number of children: Not on file  . Years of education: Not on file  . Highest education level: Not on file   Occupational History  . Not on file  Social Needs  . Financial resource strain: Not on file  . Food insecurity:    Worry: Not on file    Inability: Not on file  . Transportation needs:    Medical: Not on file    Non-medical: Not on file  Tobacco Use  . Smoking status: Never Smoker  . Smokeless tobacco: Never Used  Substance and Sexual Activity  . Alcohol use: Hanson  . Drug use: Hanson  . Sexual activity: Yes    Birth control/protection: Surgical  Lifestyle  . Physical activity:    Days per week: Not on file    Minutes per session: Not on file  . Stress: Not on file  Relationships  . Social connections:    Talks on phone: Not on file    Gets together: Not on file    Attends religious service: Not on file    Active member of club or organization: Not on file    Attends meetings of clubs or organizations: Not on file    Relationship status: Not on file  Other Topics Concern  . Not on file  Social History Narrative  . Not on file   Hanson Known Allergies Family History  Problem Relation Age of Onset  . Hypertension Mother   . Diabetes Mother   . Hypertension Father  Current Outpatient Medications (Endocrine & Metabolic):  Marland Kitchen  Dulaglutide (TRULICITY) 1.5 TD/3.2KG SOPN, Inject 1.5 mg into the skin once a week. .  etonogestrel (IMPLANON) 68 MG IMPL implant, 1 each by Subdermal route once. .  Insulin Glargine (BASAGLAR KWIKPEN) 100 UNIT/ML SOPN, Inject 0.2 mLs (20 Units total) into the skin daily. .  metFORMIN (GLUCOPHAGE) 1000 MG tablet, TAKE 1 TABLET (1,000 MG TOTAL) BY MOUTH 2 (TWO) TIMES DAILY WITH A MEAL.  Current Outpatient Medications (Cardiovascular):  .  lisinopril-hydrochlorothiazide (PRINZIDE,ZESTORETIC) 20-25 MG tablet, Take 1 tablet by mouth daily.  Current Outpatient Medications (Respiratory):  .  fluticasone (FLONASE) 50 MCG/ACT nasal spray, Place 2 sprays into both nostrils daily. Marland Kitchen  loratadine (CLARITIN) 10 MG tablet, Take 10 mg by mouth daily. .  montelukast  (SINGULAIR) 10 MG tablet, TAKE 1 TABLET(10 MG) BY MOUTH AT BEDTIME .  cetirizine (ZYRTEC) 10 MG tablet, Take 1 tablet (10 mg total) by mouth daily. (Patient taking differently: Take 10 mg by mouth at bedtime. )  Current Outpatient Medications (Analgesics):  .  aspirin 325 MG tablet, Take 325 mg by mouth 2 (two) times daily.  .  Ibuprofen-Famotidine (DUEXIS) 800-26.6 MG TABS, Take 1 tablet by mouth 3 (three) times daily.   Current Outpatient Medications (Other):  .  amoxicillin-clavulanate (AUGMENTIN) 875-125 MG tablet, Take 1 tablet by mouth 2 (two) times daily. .  Biotin 1000 MCG tablet, Take 1,000 mcg by mouth daily. .  blood glucose meter kit and supplies, Dispense based on patient and insurance preference. Use up to four times daily as directed. (FOR ICD-10 E10.9, E11.9). .  Diclofenac Sodium (PENNSAID) 2 % SOLN, Place 2 g onto the skin 2 (two) times daily. Marland Kitchen  estradiol (ESTRACE VAGINAL) 0.1 MG/GM vaginal cream, Place 1 Applicatorful vaginally at bedtime. .  Insulin Pen Needle (PEN NEEDLES) 30G X 8 MM MISC, 1 pen by Does not apply route daily. Used with insulin .  ONE TOUCH ULTRA TEST test strip, USE TO TEST FOUR TIMES DAILY AS DIRECTED .  triamcinolone ointment (KENALOG) 0.5 %, APPLY EXTERNALLY TO THE AFFECTED AREA TWICE DAILY .  vitamin C (ASCORBIC ACID) 500 MG tablet, Take 500 mg by mouth daily.    Past medical history, social, surgical and family history all reviewed in electronic medical record.  Hanson pertanent information unless stated regarding to the chief complaint.   Review of Systems:  Hanson headache, visual changes, nausea, vomiting, diarrhea, constipation, dizziness, abdominal pain, skin rash, fevers, chills, night sweats, weight loss, swollen lymph nodes, body aches, joint swelling, muscle aches, chest pain, shortness of breath, mood changes.   Objective  Blood pressure 102/70, pulse (!) 105, height 5' (1.524 m), weight 237 lb (107.5 kg), SpO2 98 %.    General: Hanson apparent  distress alert and oriented x3 mood and affect normal, dressed appropriately.  Obese  HEENT: Pupils equal, extraocular movements intact  Respiratory: Patient's speak in full sentences and does not appear short of breath  Cardiovascular: Hanson lower extremity edema, non tender, Hanson erythema  Skin: Warm dry intact with Hanson signs of infection or rash on extremities or on axial skeleton.  Abdomen: Soft nontender  Neuro: Cranial nerves II through XII are intact, neurovascularly intact in all extremities with 2+ DTRs and 2+ pulses.  Lymph: Hanson lymphadenopathy of posterior or anterior cervical chain or axillae bilaterally.  Gait normal with good balance and coordination.  MSK:  Non tender with full range of motion and good stability and symmetric strength and tone of  shoulders, elbows, wrist, hip, knee and ankles bilaterally.  Foot exam shows the patient does have overpronation of the hindfoot bilaterally as well as pes planus.  Mild splaying between the first and second toes.  Tightness of the left Achilles noted.    Impression and Recommendations:     This case required medical decision making of moderate complexity. The above documentation has been reviewed and is accurate and complete Nancy Pulley, DO       Note: This dictation was prepared with Dragon dictation along with smaller phrase technology. Any transcriptional errors that result from this process are unintentional.

## 2018-06-03 ENCOUNTER — Other Ambulatory Visit: Payer: Self-pay

## 2018-06-03 ENCOUNTER — Ambulatory Visit: Payer: Self-pay

## 2018-06-03 ENCOUNTER — Ambulatory Visit: Payer: Federal, State, Local not specified - PPO | Admitting: Family Medicine

## 2018-06-03 ENCOUNTER — Encounter: Payer: Self-pay | Admitting: Family Medicine

## 2018-06-03 VITALS — BP 102/70 | HR 105 | Ht 60.0 in | Wt 237.0 lb

## 2018-06-03 DIAGNOSIS — M25572 Pain in left ankle and joints of left foot: Secondary | ICD-10-CM

## 2018-06-03 DIAGNOSIS — M7662 Achilles tendinitis, left leg: Secondary | ICD-10-CM | POA: Diagnosis not present

## 2018-06-03 DIAGNOSIS — G8929 Other chronic pain: Secondary | ICD-10-CM | POA: Diagnosis not present

## 2018-06-03 DIAGNOSIS — M79672 Pain in left foot: Secondary | ICD-10-CM | POA: Diagnosis not present

## 2018-06-03 NOTE — Assessment & Plan Note (Signed)
Left Achilles tendinitis.  Has had this for quite some time.  Does have breakdown of the foot as well.  Encourage weight loss, putting custom orthotics today that I think patient will tolerate well.  Has seen other providers for it.  Discussed icing regimen and home exercises.  Discussed which activities of doing which wants to avoid.  Patient will follow-up again in 4 weeks and we can make adjustments to orthotics if necessary

## 2018-06-03 NOTE — Patient Instructions (Signed)
You are awesom e Wear them 2 hours first day then increase hour a day  You know where I am if you need me ;) Be safe

## 2018-10-11 DIAGNOSIS — Z3046 Encounter for surveillance of implantable subdermal contraceptive: Secondary | ICD-10-CM | POA: Diagnosis not present

## 2018-10-11 DIAGNOSIS — N951 Menopausal and female climacteric states: Secondary | ICD-10-CM | POA: Diagnosis not present

## 2018-10-11 DIAGNOSIS — Z01419 Encounter for gynecological examination (general) (routine) without abnormal findings: Secondary | ICD-10-CM | POA: Diagnosis not present

## 2018-10-11 DIAGNOSIS — Z124 Encounter for screening for malignant neoplasm of cervix: Secondary | ICD-10-CM | POA: Diagnosis not present

## 2018-10-11 DIAGNOSIS — Z130 Encounter for screening for diseases of the blood and blood-forming organs and certain disorders involving the immune mechanism: Secondary | ICD-10-CM | POA: Diagnosis not present

## 2018-10-11 LAB — HM PAP SMEAR

## 2018-10-12 ENCOUNTER — Other Ambulatory Visit: Payer: Self-pay | Admitting: Obstetrics and Gynecology

## 2018-10-12 DIAGNOSIS — R921 Mammographic calcification found on diagnostic imaging of breast: Secondary | ICD-10-CM

## 2018-10-18 ENCOUNTER — Other Ambulatory Visit: Payer: Federal, State, Local not specified - PPO

## 2018-10-20 DIAGNOSIS — Z3046 Encounter for surveillance of implantable subdermal contraceptive: Secondary | ICD-10-CM | POA: Diagnosis not present

## 2018-10-20 DIAGNOSIS — Z3049 Encounter for surveillance of other contraceptives: Secondary | ICD-10-CM | POA: Diagnosis not present

## 2018-10-20 DIAGNOSIS — Z23 Encounter for immunization: Secondary | ICD-10-CM | POA: Diagnosis not present

## 2018-10-21 ENCOUNTER — Other Ambulatory Visit: Payer: Self-pay | Admitting: Obstetrics and Gynecology

## 2018-10-21 ENCOUNTER — Other Ambulatory Visit: Payer: Self-pay

## 2018-10-21 ENCOUNTER — Ambulatory Visit: Payer: Federal, State, Local not specified - PPO

## 2018-10-21 ENCOUNTER — Other Ambulatory Visit: Payer: Federal, State, Local not specified - PPO

## 2018-10-21 ENCOUNTER — Ambulatory Visit
Admission: RE | Admit: 2018-10-21 | Discharge: 2018-10-21 | Disposition: A | Discharge status: Home / Self Care | Payer: Federal, State, Local not specified - PPO | Source: Ambulatory Visit | Attending: Obstetrics and Gynecology | Admitting: Obstetrics and Gynecology

## 2018-10-21 DIAGNOSIS — R921 Mammographic calcification found on diagnostic imaging of breast: Secondary | ICD-10-CM

## 2018-10-21 DIAGNOSIS — D242 Benign neoplasm of left breast: Secondary | ICD-10-CM

## 2018-10-25 ENCOUNTER — Encounter: Payer: Self-pay | Admitting: Internal Medicine

## 2018-10-25 NOTE — Progress Notes (Signed)
Abstracted and sent to scan  

## 2018-11-03 ENCOUNTER — Other Ambulatory Visit: Payer: Self-pay

## 2018-11-03 MED ORDER — LISINOPRIL-HYDROCHLOROTHIAZIDE 20-25 MG PO TABS: 1.0000 | ORAL_TABLET | Freq: Every day | ORAL | 3 refills | Status: DC

## 2018-11-08 DIAGNOSIS — R102 Pelvic and perineal pain: Secondary | ICD-10-CM | POA: Diagnosis not present

## 2018-11-08 DIAGNOSIS — L02211 Cutaneous abscess of abdominal wall: Secondary | ICD-10-CM | POA: Diagnosis not present

## 2018-11-18 ENCOUNTER — Other Ambulatory Visit (INDEPENDENT_AMBULATORY_CARE_PROVIDER_SITE_OTHER): Payer: Federal, State, Local not specified - PPO

## 2018-11-18 ENCOUNTER — Ambulatory Visit (INDEPENDENT_AMBULATORY_CARE_PROVIDER_SITE_OTHER): Payer: Federal, State, Local not specified - PPO | Admitting: Internal Medicine

## 2018-11-18 ENCOUNTER — Other Ambulatory Visit: Payer: Self-pay

## 2018-11-18 ENCOUNTER — Encounter: Payer: Self-pay | Admitting: Internal Medicine

## 2018-11-18 VITALS — BP 116/72 | HR 91 | Temp 98.1°F | Ht 60.0 in | Wt 238.0 lb

## 2018-11-18 DIAGNOSIS — E1169 Type 2 diabetes mellitus with other specified complication: Secondary | ICD-10-CM

## 2018-11-18 DIAGNOSIS — L02211 Cutaneous abscess of abdominal wall: Secondary | ICD-10-CM | POA: Insufficient documentation

## 2018-11-18 DIAGNOSIS — E1165 Type 2 diabetes mellitus with hyperglycemia: Secondary | ICD-10-CM | POA: Diagnosis not present

## 2018-11-18 DIAGNOSIS — E785 Hyperlipidemia, unspecified: Secondary | ICD-10-CM | POA: Diagnosis not present

## 2018-11-18 LAB — LIPID PANEL
Cholesterol: 146 mg/dL (ref 0–200)
HDL: 33.4 mg/dL — ABNORMAL LOW (ref >39.00)
LDL Cholesterol: 81 mg/dL (ref 0–99)
NonHDL: 112.21
Total CHOL/HDL Ratio: 4
Triglycerides: 156 mg/dL — ABNORMAL HIGH (ref 0.0–149.0)
VLDL: 31.2 mg/dL (ref 0.0–40.0)

## 2018-11-18 LAB — COMPREHENSIVE METABOLIC PANEL
ALT: 33 U/L (ref 0–35)
AST: 26 U/L (ref 0–37)
Albumin: 4.1 g/dL (ref 3.5–5.2)
Alkaline Phosphatase: 117 U/L (ref 39–117)
BUN: 19 mg/dL (ref 6–23)
CO2: 23 mEq/L (ref 19–32)
Calcium: 9.5 mg/dL (ref 8.4–10.5)
Chloride: 99 mEq/L (ref 96–112)
Creatinine, Ser: 1.38 mg/dL — ABNORMAL HIGH (ref 0.40–1.20)
GFR: 48.8 mL/min — ABNORMAL LOW (ref >60.00)
Glucose, Bld: 245 mg/dL — ABNORMAL HIGH (ref 70–99)
Potassium: 4.4 mEq/L (ref 3.5–5.1)
Sodium: 130 mEq/L — ABNORMAL LOW (ref 135–145)
Total Bilirubin: 0.3 mg/dL (ref 0.2–1.2)
Total Protein: 7.4 g/dL (ref 6.0–8.3)

## 2018-11-18 LAB — CBC
HCT: 35.7 % — ABNORMAL LOW (ref 36.0–46.0)
Hemoglobin: 11.9 g/dL — ABNORMAL LOW (ref 12.0–15.0)
MCHC: 33.5 g/dL (ref 30.0–36.0)
MCV: 86.7 fl (ref 78.0–100.0)
Platelets: 308 10*3/uL (ref 150.0–400.0)
RBC: 4.11 Mil/uL (ref 3.87–5.11)
RDW: 13.5 % (ref 11.5–15.5)
WBC: 12.6 10*3/uL — ABNORMAL HIGH (ref 4.0–10.5)

## 2018-11-18 LAB — HEMOGLOBIN A1C: Hgb A1c MFr Bld: 12 % — ABNORMAL HIGH (ref 4.6–6.5)

## 2018-11-18 LAB — MICROALBUMIN / CREATININE URINE RATIO
Creatinine,U: 165.9 mg/dL
Microalb Creat Ratio: 1.3 mg/g (ref 0.0–30.0)
Microalb, Ur: 2.1 mg/dL — ABNORMAL HIGH (ref 0.0–1.9)

## 2018-11-18 NOTE — Progress Notes (Signed)
   Subjective:   Patient ID: [name], female    DOB: May 20, 1965, 53 y.o.   MRN: [id number]  HPI The patient is a 53 YO female coming in for concerns about wound on stomach (got worse about 10 days ago and spontaneously drained, took antibiotic from gyn who evaluated and today is last day, originally painful, now seems to be closing and not painful, no fevers or chills throughout), and diabetes (needs follow up, not controlled but previously closer to goal, working on weight loss and is down about 1-2 pounds, denies exercise currently) and cholesterol (currently diet controlled but with diabetes likely should be on statin, she was making changes for diabetes control last year and did not want multiple changes).   Review of Systems  Constitutional: Negative.   HENT: Negative.   Eyes: Negative.   Respiratory: Negative for cough, chest tightness and shortness of breath.   Cardiovascular: Negative for chest pain, palpitations and leg swelling.  Gastrointestinal: Negative for abdominal distention, abdominal pain, constipation, diarrhea, nausea and vomiting.  Musculoskeletal: Negative.   Skin: Negative.   Neurological: Negative.   Psychiatric/Behavioral: Negative.     Objective:  Physical Exam Constitutional:      Appearance: She is well-developed. She is obese.  HENT:     Head: Normocephalic and atraumatic.  Neck:     Musculoskeletal: Normal range of motion.  Cardiovascular:     Rate and Rhythm: Normal rate and regular rhythm.  Pulmonary:     Effort: Pulmonary effort is normal. No respiratory distress.     Breath sounds: Normal breath sounds. No wheezing or rales.  Abdominal:     General: Bowel sounds are normal. There is no distension.     Palpations: Abdomen is soft.     Tenderness: There is no abdominal tenderness. There is no rebound.     Comments: Wound right inguinal folds without expressible purulence, no abscess appreciable with any fluctuance in the region  Skin:     General: Skin is warm and dry.  Neurological:     Mental Status: She is alert and oriented to person, place, and time.     Coordination: Coordination normal.     Vitals:   11/18/18 0858  BP: 116/72  Pulse: 91  Temp: 98.1 F (36.7 C)  TempSrc: Oral  SpO2: 97%  Weight: 238 lb (108 kg)  Height: 5' (1.524 m)    Assessment & Plan:

## 2018-11-18 NOTE — Assessment & Plan Note (Signed)
Checking HgA1c and microalbumin to creatinine ratio and adjust trulicity, basaglar and metformin.

## 2018-11-18 NOTE — Patient Instructions (Signed)
We will check the blood work today and call you back about the results.   Diabetes Mellitus and Standards of Medical Care Managing diabetes (diabetes mellitus) can be complicated. Your diabetes treatment may be managed by a team of health care providers, including:  A physician who specializes in diabetes (endocrinologist).  A nurse practitioner or physician assistant.  Nurses.  A diet and nutrition specialist (registered dietitian).  A certified diabetes educator (CDE).  An exercise specialist.  A pharmacist.  An eye doctor.  A foot specialist (podiatrist).  A dentist.  A primary care provider.  A mental health provider. Your health care providers follow guidelines to help you get the best quality of care. The following schedule is a general guideline for your diabetes management plan. Your health care providers may give you more specific instructions. Physical exams Upon being diagnosed with diabetes mellitus, and each year after that, your health care provider will ask about your medical and family history. He or she will also do a physical exam. Your exam may include:  Measuring your height, weight, and body mass index (BMI).  Checking your blood pressure. This will be done at every routine medical visit. Your target blood pressure may vary depending on your medical conditions, your age, and other factors.  Thyroid gland exam.  Skin exam.  Screening for damage to your nerves (peripheral neuropathy). This may include checking the pulse in your legs and feet and checking the level of sensation in your hands and feet.  A complete foot exam to inspect the structure and skin of your feet, including checking for cuts, bruises, redness, blisters, sores, or other problems.  Screening for blood vessel (vascular) problems, which may include checking the pulse in your legs and feet and checking your temperature. Blood tests Depending on your treatment plan and your personal  needs, you may have the following tests done:  HbA1c (hemoglobin A1c). This test provides information about blood sugar (glucose) control over the previous 2-3 months. It is used to adjust your treatment plan, if needed. This test will be done: ? At least 2 times a year, if you are meeting your treatment goals. ? 4 times a year, if you are not meeting your treatment goals or if treatment goals have changed.  Lipid testing, including total, LDL, and HDL cholesterol and triglyceride levels. ? The goal for LDL is less than 100 mg/dL (5.5 mmol/L). If you are at high risk for complications, the goal is less than 70 mg/dL (3.9 mmol/L). ? The goal for HDL is 40 mg/dL (2.2 mmol/L) or higher for men and 50 mg/dL (2.8 mmol/L) or higher for women. An HDL cholesterol of 60 mg/dL (3.3 mmol/L) or higher gives some protection against heart disease. ? The goal for triglycerides is less than 150 mg/dL (8.3 mmol/L).  Liver function tests.  Kidney function tests.  Thyroid function tests. Dental and eye exams  Visit your dentist two times a year.  If you have type 1 diabetes, your health care provider may recommend an eye exam 3-5 years after you are diagnosed, and then once a year after your first exam. ? For children with type 1 diabetes, a health care provider may recommend an eye exam when your child is age 65 or older and has had diabetes for 3-5 years. After the first exam, your child should get an eye exam once a year.  If you have type 2 diabetes, your health care provider may recommend an eye exam as soon as  you are diagnosed, and then once a year after your first exam. Immunizations   The yearly flu (influenza) vaccine is recommended for everyone 6 months or older who has diabetes.  The pneumonia (pneumococcal) vaccine is recommended for everyone 2 years or older who has diabetes. If you are 37 or older, you may get the pneumonia vaccine as a series of two separate shots.  The hepatitis B  vaccine is recommended for adults shortly after being diagnosed with diabetes.  Adults and children with diabetes should receive all other vaccines according to age-specific recommendations from the Centers for Disease Control and Prevention (CDC). Mental and emotional health Screening for symptoms of eating disorders, anxiety, and depression is recommended at the time of diagnosis and afterward as needed. If your screening shows that you have symptoms (positive screening result), you may need more evaluation and you may work with a mental health care provider. Treatment plan Your treatment plan will be reviewed at every medical visit. You and your health care provider will discuss:  How you are taking your medicines, including insulin.  Any side effects you are experiencing.  Your blood glucose target goals.  The frequency of your blood glucose monitoring.  Lifestyle habits, such as activity level as well as tobacco, alcohol, and substance use. Diabetes self-management education Your health care provider will assess how well you are monitoring your blood glucose levels and whether you are taking your insulin correctly. He or she may refer you to:  A certified diabetes educator to manage your diabetes throughout your life, starting at diagnosis.  A registered dietitian who can create or review your personal nutrition plan.  An exercise specialist who can discuss your activity level and exercise plan. Summary  Managing diabetes (diabetes mellitus) can be complicated. Your diabetes treatment may be managed by a team of health care providers.  Your health care providers follow guidelines in order to help you get the best quality of care.  Standards of care including having regular physical exams, blood tests, blood pressure monitoring, immunizations, screening tests, and education about how to manage your diabetes.  Your health care providers may also give you more specific instructions  based on your individual health. This information is not intended to replace advice given to you by your health care provider. Make sure you discuss any questions you have with your health care provider. Document Released: 11/02/2008 Document Revised: 09/24/2017 Document Reviewed: 10/04/2015 Elsevier Patient Education  2020 [employer].

## 2018-11-18 NOTE — Assessment & Plan Note (Signed)
Finish last day of antibiotics and no further are needed. Talked about keeping clean and using antibiotic ointment if needed.

## 2018-11-18 NOTE — Assessment & Plan Note (Signed)
Checking lipid panel and adjust as needed.  

## 2018-11-23 ENCOUNTER — Other Ambulatory Visit: Payer: Self-pay | Admitting: General Surgery

## 2018-11-23 ENCOUNTER — Other Ambulatory Visit: Payer: Self-pay | Admitting: Internal Medicine

## 2018-11-23 DIAGNOSIS — E1165 Type 2 diabetes mellitus with hyperglycemia: Secondary | ICD-10-CM

## 2018-11-23 DIAGNOSIS — Z6841 Body Mass Index (BMI) 40.0 and over, adult: Secondary | ICD-10-CM | POA: Diagnosis not present

## 2018-11-23 DIAGNOSIS — E119 Type 2 diabetes mellitus without complications: Secondary | ICD-10-CM | POA: Diagnosis not present

## 2018-11-23 DIAGNOSIS — D242 Benign neoplasm of left breast: Secondary | ICD-10-CM

## 2018-11-23 DIAGNOSIS — I10 Essential (primary) hypertension: Secondary | ICD-10-CM | POA: Diagnosis not present

## 2018-11-25 ENCOUNTER — Other Ambulatory Visit: Payer: Self-pay | Admitting: General Surgery

## 2018-11-25 DIAGNOSIS — D242 Benign neoplasm of left breast: Secondary | ICD-10-CM

## 2018-12-05 ENCOUNTER — Other Ambulatory Visit: Payer: Self-pay | Admitting: Internal Medicine

## 2018-12-06 ENCOUNTER — Ambulatory Visit: Admit: 2018-12-06 | Payer: Federal, State, Local not specified - PPO | Admitting: General Surgery

## 2018-12-06 ENCOUNTER — Other Ambulatory Visit (HOSPITAL_COMMUNITY): Payer: Federal, State, Local not specified - PPO

## 2018-12-06 SURGERY — BREAST LUMPECTOMY WITH RADIOACTIVE SEED LOCALIZATION
Anesthesia: General | Site: Breast | Laterality: Left

## 2019-01-06 ENCOUNTER — Ambulatory Visit: Payer: Federal, State, Local not specified - PPO | Admitting: Internal Medicine

## 2019-01-27 DIAGNOSIS — K080 Exfoliation of teeth due to systemic causes: Secondary | ICD-10-CM | POA: Diagnosis not present

## 2019-02-01 ENCOUNTER — Other Ambulatory Visit: Payer: Self-pay

## 2019-02-03 ENCOUNTER — Ambulatory Visit (INDEPENDENT_AMBULATORY_CARE_PROVIDER_SITE_OTHER): Payer: Federal, State, Local not specified - PPO | Admitting: Internal Medicine

## 2019-02-03 ENCOUNTER — Encounter: Payer: Self-pay | Admitting: Internal Medicine

## 2019-02-03 VITALS — BP 118/84 | HR 100 | Temp 98.3°F | Ht 60.0 in | Wt 237.0 lb

## 2019-02-03 DIAGNOSIS — E1165 Type 2 diabetes mellitus with hyperglycemia: Secondary | ICD-10-CM

## 2019-02-03 LAB — POCT GLYCOSYLATED HEMOGLOBIN (HGB A1C): Hemoglobin A1C: 9.6 % — AB (ref 4.0–5.6)

## 2019-02-03 LAB — GLUCOSE, POCT (MANUAL RESULT ENTRY): POC Glucose: 276 mg/dl — AB (ref 70–99)

## 2019-02-03 MED ORDER — INSULIN PEN NEEDLE 31G X 5 MM MISC: 1.0000 | Freq: Every day | 11 refills | Status: DC

## 2019-02-03 MED ORDER — ONETOUCH VERIO VI STRP: ORAL_STRIP | 12 refills | Status: AC

## 2019-02-03 MED ORDER — BASAGLAR KWIKPEN 100 UNIT/ML ~~LOC~~ SOPN: 28.0000 [IU] | PEN_INJECTOR | Freq: Every day | SUBCUTANEOUS | 6 refills | Status: DC

## 2019-02-03 MED ORDER — METFORMIN HCL 1000 MG PO TABS: 1000.0000 mg | ORAL_TABLET | Freq: Two times a day (BID) | ORAL | 3 refills | Status: DC

## 2019-02-03 NOTE — Patient Instructions (Addendum)
-   Increase Basaglar to 28 units daily  - Restart Metformin 1000 mg Twice daily      - Check sugar fasting and bedtime     Choose healthy, lower carb lower calorie snacks: toss salad, vegetables, cottage cheese, peanut butter, low fat cheese / string cheese, lower sodium deli meat, tuna salad or chicken salad, boiled eggs.      HOW TO TREAT LOW BLOOD SUGARS (Blood sugar LESS THAN 70 MG/DL)  Please follow the RULE OF 15 for the treatment of hypoglycemia treatment (when your (blood sugars are less than 70 mg/dL)    STEP 1: Take 15 grams of carbohydrates when your blood sugar is low, which includes:   3-4 GLUCOSE TABS  OR  3-4 OZ OF JUICE OR REGULAR SODA OR  ONE TUBE OF GLUCOSE GEL     STEP 2: RECHECK blood sugar in 15 MINUTES STEP 3: If your blood sugar is still low at the 15 minute recheck --> then, go back to STEP 1 and treat AGAIN with another 15 grams of carbohydrates.

## 2019-02-03 NOTE — Progress Notes (Signed)
Name: [name]  MRN/ DOB: [id number], Jul 12, 1965   Age/ Sex: 54 y.o., female    PCP: Hoyt Koch, MD   Reason for Endocrinology Evaluation: Type 2 Diabetes Mellitus     Date of Initial Endocrinology Visit: 02/03/2019     PATIENT IDENTIFIER: [name] is a 54 y.o. female with a past medical history of HTN, T2DM, and GERD. The patient presented for initial endocrinology clinic visit on 02/03/2019 for consultative assistance with her diabetes management.    HPI: Ms. Leven was    Diagnosed with DM in 2010 Prior Medications tried/Intolerance: insulin started in 2020. Trulicity - yeast infection , and hair loss. Glipizide Januvia  Currently checking blood sugars 1 x / day. Hypoglycemia episodes : no           Hemoglobin A1c has ranged from 7.1% in 2016, peaking at 12.0% in 2020. Patient required assistance for hypoglycemia: no Patient has required hospitalization within the last 1 year from hyper or hypoglycemia: no  In terms of diet, the patient eats 2 meals a day, snacks through the day. Drinks occasional sugar- sweetened beverages.    Works at [employer] at the [place]: Basaglar 20 units daily  Metformin 1000 mg BID    Statin: no ACE-I/ARB: yes Prior Diabetic Education:    METER DOWNLOAD SUMMARY: Did not bring    DIABETIC COMPLICATIONS: Microvascular complications:    Denies: CKD, retinopathy, neuropathy   Last eye exam: Completed 12/2018  Macrovascular complications:    Denies: CAD, PVD, CVA   PAST HISTORY: Past Medical History:  Past Medical History:  Diagnosis Date  . Cough    taking Sudafed as needed.Iophen C as needed also for cough.No fever  . DIAB W/UNSPEC COMP TYPE II/UNSPEC TYPE UNCNTRL 08/24/2008   takes Metformin,CInnamon,and Glipizide daily  . Eczema   . Family history of adverse reaction to anesthesia    dad slow to wake up  . GASTROESOPHAGEAL REFLUX, NO ESOPHAGITIS 03/18/2006   doesn't take any meds  . History of blood clots 8+ yrs ago   right calf  . HYPERTENSION, BENIGN SYSTEMIC 03/18/2006   takes Lisinopril-HCTZ daily  . Joint pain   . Joint swelling   . OBESITY, NOS 03/18/2006  . PONV (postoperative nausea and vomiting)   . Seasonal allergies    takes Zyrtec and Claritin daily as well as Fluticasone   Past Surgical History:  Past Surgical History:  Procedure Laterality Date  . ABLATION    . BREAST BIOPSY Left 10/30/2015  . CHOLECYSTECTOMY    . ORIF ANKLE FRACTURE Right 02/01/2015   Procedure: OPEN REDUCTION INTERNAL FIXATION RIGHT ANKLE FRACTURE;  Surgeon: Mcarthur Rossetti, MD;  Location: [facility];  Service: Orthopedics;  Laterality: Right;  . TUBAL LIGATION  2004      Social History:  reports that she has never smoked. She has never used smokeless tobacco. She reports that she does not drink alcohol or use drugs. Family History:  Family History  Problem Relation Age of Onset  . Hypertension Mother   . Diabetes Mother   . Hypertension Father      HOME MEDICATIONS: Allergies as of 02/03/2019      Reactions   Trulicity [dulaglutide] Other (See Comments)   Yeast infections, hair breakage      Medication List       Accurate as of February 03, 2019 10:46 AM. If you have any questions, ask your nurse or doctor.  STOP taking these medications   amoxicillin-clavulanate 875-125 MG tablet Commonly known as: AUGMENTIN   Dulaglutide 1.5 MG/0.5ML Sopn Commonly known as: Trulicity     TAKE these medications   aspirin 325 MG tablet Take 325 mg by mouth 2 (two) times daily.   Basaglar KwikPen 100 UNIT/ML Sopn Inject 0.28 mLs (28 Units total) into the skin daily. What changed: how much to take   Biotin 1000 MCG tablet Take 1,000 mcg by mouth daily.   blood glucose meter kit and supplies Dispense based on patient and insurance preference. Use up to four times daily as directed. (FOR ICD-10 E10.9, E11.9).   cetirizine 10 MG tablet  Commonly known as: ZYRTEC Take 1 tablet (10 mg total) by mouth daily. What changed: when to take this   Diclofenac Sodium 2 % Soln Commonly known as: Pennsaid Place 2 g onto the skin 2 (two) times daily.   estradiol 0.1 MG/GM vaginal cream Commonly known as: ESTRACE VAGINAL Place 1 Applicatorful vaginally at bedtime.   fluticasone 50 MCG/ACT nasal spray Commonly known as: Flonase Place 2 sprays into both nostrils daily.   Ibuprofen-Famotidine 800-26.6 MG Tabs Commonly known as: Duexis Take 1 tablet by mouth 3 (three) times daily.   Implanon 68 MG Impl implant Generic drug: etonogestrel 1 each by Subdermal route once.   Insulin Pen Needle 31G X 5 MM Misc 1 Device by Does not apply route daily. What changed:   medication strength  how much to take  additional instructions   lisinopril-hydrochlorothiazide 20-25 MG tablet Commonly known as: ZESTORETIC Take 1 tablet by mouth daily.   loratadine 10 MG tablet Commonly known as: CLARITIN Take 10 mg by mouth daily.   metFORMIN 1000 MG tablet Commonly known as: GLUCOPHAGE Take 1 tablet (1,000 mg total) by mouth 2 (two) times daily with a meal. TAKE 1 TABLET (1,000 MG TOTAL) BY MOUTH 2 (TWO) TIMES DAILY WITH A MEAL. What changed:   how much to take  how to take this  when to take this   montelukast 10 MG tablet Commonly known as: SINGULAIR TAKE 1 TABLET(10 MG) BY MOUTH AT BEDTIME   OneTouch Verio test strip Generic drug: glucose blood 2x daily What changed: See the new instructions.   triamcinolone ointment 0.5 % Commonly known as: KENALOG APPLY EXTERNALLY TO THE AFFECTED AREA TWICE DAILY   vitamin C 500 MG tablet Commonly known as: ASCORBIC ACID Take 500 mg by mouth daily.        ALLERGIES: Allergies  Allergen Reactions  . Trulicity [Dulaglutide] Other (See Comments)    Yeast infections, hair breakage     REVIEW OF SYSTEMS: A comprehensive ROS was conducted with the patient and is negative  except as per HPI and below:  Review of Systems  Eyes: Negative for blurred vision and pain.  Gastrointestinal: Negative for diarrhea and nausea.  Genitourinary: Negative for frequency.  Endo/Heme/Allergies: Negative for polydipsia.      OBJECTIVE:   VITAL SIGNS: BP 118/84 (BP Location: Left Arm, Patient Position: Sitting, Cuff Size: Large)   Pulse 100   Temp 98.3 F (36.8 C)   Ht 5' (1.524 m)   Wt 237 lb (107.5 kg)   SpO2 99%   BMI 46.29 kg/m    PHYSICAL EXAM:  General: Pt appears well and is in NAD  HEENT:  Eyes: External eye exam normal without stare, lid lag or exophthalmos.  EOM intact.   Neck: General: Supple without adenopathy or carotid bruits. Thyroid: Thyroid size normal.  No goiter or nodules appreciated. No thyroid bruit.  Lungs: Clear with good BS bilat with no rales, rhonchi, or wheezes  Heart: RRR with normal S1 and S2 and no gallops; no murmurs; no rub  Abdomen: Normoactive bowel sounds, soft, nontender, without masses or organomegaly palpable  Extremities:  Lower extremities - No pretibial edema. No lesions.  Skin: Normal texture and temperature to palpation.  Neuro: MS is good with appropriate affect, pt is alert and Ox3    DM foot exam: 02/02/2018  The skin of the feet is intact without sores or ulcerations. The pedal pulses are 2+ on right and 2+ on left. The sensation is intact to a screening 5.07, 10 gram monofilament bilaterally   DATA REVIEWED:  Lab Results  Component Value Date   HGBA1C 9.6 (A) 02/03/2019   HGBA1C 12.0 (H) 11/18/2018   HGBA1C 8.1 (H) 02/25/2018   Lab Results  Component Value Date   MICROALBUR 2.1 (H) 11/18/2018   LDLCALC 81 11/18/2018   CREATININE 1.38 (H) 11/18/2018   Lab Results  Component Value Date   MICRALBCREAT 1.3 11/18/2018    Lab Results  Component Value Date   CHOL 146 11/18/2018   HDL 33.40 (L) 11/18/2018   LDLCALC 81 11/18/2018   LDLDIRECT 84 01/25/2009   TRIG 156.0 (H) 11/18/2018   CHOLHDL 4  11/18/2018       Old records , labs and images have been reviewed.  In-office BG 276 mg/dL  ASSESSMENT / PLAN / RECOMMENDATIONS:   1) Type 2 Diabetes Mellitus, Poorly controlled, Without complications - Most recent A1c of  9.6 %. Goal A1c < 7.0 %.    Plan: GENERAL: I have discussed with the patient the pathophysiology of diabetes. We went over the natural progression of the disease. We talked about both insulin resistance and insulin deficiency. We stressed the importance of lifestyle changes including diet and exercise. I explained the complications associated with diabetes including retinopathy, nephropathy, neuropathy as well as increased risk of cardiovascular disease. We went over the benefit seen with glycemic control.   I explained to the patient that diabetic patients are at higher than normal risk for amputations.  We discussed the importance of avoiding sugar-sweetened beverages and avoiding snacks or choosing low carb snacks.  We also discussed the importance of checking glucose at home and having this data available to me.   Will increase insulin as below. Will restart Metformin   MEDICATIONS: Increase Basaglar to 28 units daily  Restart Metformin 1000 mg Twice daily   EDUCATION / INSTRUCTIONS:  BG monitoring instructions: Patient is instructed to check her blood sugars 2 times a day, fasting and bedtime.  Call [facility] if: BG persistently < 70 or > 300. . I reviewed the Rule of 15 for the treatment of hypoglycemia in detail with the patient. Literature supplied.   2) Diabetic complications:   Eye: Does not have known diabetic retinopathy.   Neuro/ Feet: Does not have known diabetic peripheral neuropathy.  Renal: Patient does not have known baseline CKD. She is not on an ACEI/ARB at present.Check urine albumin/creatinine ratio yearly starting at time of diagnosis.  3) Lipids: Patient is not on  A statin , LDL acceptable at 81 mg/dL    4)  Hypertension: She is  at goal of < 140/90 mmHg.   F/u in 3 months     Signed electronically by: Mack Guise, MD  [facility] End[facility]  [facility] [address] [place]  [place], [place] [address] Phone: [phone] FAX: [phone]   CC: Hoyt Koch, [place] [address] Phone: [phone]  Fax: [phone]    Return to Endocrinology clinic as below: Future Appointments  Date Time Provider [place]  05/05/2019  9:50 AM Raniyah Curenton, Melanie Crazier, MD [facility] None

## 2019-04-05 ENCOUNTER — Other Ambulatory Visit: Payer: Self-pay | Admitting: Internal Medicine

## 2019-04-20 DIAGNOSIS — H1011 Acute atopic conjunctivitis, right eye: Secondary | ICD-10-CM | POA: Diagnosis not present

## 2019-04-20 DIAGNOSIS — I10 Essential (primary) hypertension: Secondary | ICD-10-CM | POA: Diagnosis not present

## 2019-04-20 DIAGNOSIS — E119 Type 2 diabetes mellitus without complications: Secondary | ICD-10-CM | POA: Diagnosis not present

## 2019-04-20 DIAGNOSIS — H44001 Unspecified purulent endophthalmitis, right eye: Secondary | ICD-10-CM | POA: Diagnosis not present

## 2019-04-29 ENCOUNTER — Ambulatory Visit: Payer: Federal, State, Local not specified - PPO | Attending: Internal Medicine

## 2019-04-29 DIAGNOSIS — Z23 Encounter for immunization: Secondary | ICD-10-CM

## 2019-04-29 NOTE — Progress Notes (Signed)
   Covid-19 Vaccination Clinic  Name:  [name]    MRN: [id number] DOB: 07/02/1965  04/29/2019  [name] was observed post Covid-19 immunization for 15 minutes without incident. She was provided with Vaccine Information Sheet and instruction to access the V-Safe system.   [name] was instructed to call 911 with any severe reactions post vaccine: Marland Kitchen Difficulty breathing  . Swelling of face and throat  . A fast heartbeat  . A bad rash all over body  . Dizziness and weakness   Immunizations Administered    Name Date Dose VIS Date Route   Moderna COVID-19 Vaccine 04/29/2019 12:44 PM 0.5 mL 12/20/2018 Intramuscular   Manufacturer: Moderna   LotFP:[id number]   [id number]

## 2019-05-05 ENCOUNTER — Ambulatory Visit: Payer: Federal, State, Local not specified - PPO | Admitting: Internal Medicine

## 2019-05-16 DIAGNOSIS — H04123 Dry eye syndrome of bilateral lacrimal glands: Secondary | ICD-10-CM | POA: Diagnosis not present

## 2019-05-27 ENCOUNTER — Ambulatory Visit: Payer: Federal, State, Local not specified - PPO | Attending: Internal Medicine

## 2019-05-27 DIAGNOSIS — Z23 Encounter for immunization: Secondary | ICD-10-CM

## 2019-05-27 NOTE — Progress Notes (Signed)
   Covid-19 Vaccination Clinic  Name:  [name]    MRN: [id number] DOB: 1965/02/09  05/27/2019  [name] was observed post Covid-19 immunization for 15 minutes without incident. She was provided with Vaccine Information Sheet and instruction to access the V-Safe system.   [name] was instructed to call 911 with any severe reactions post vaccine: Marland Kitchen Difficulty breathing  . Swelling of face and throat  . A fast heartbeat  . A bad rash all over body  . Dizziness and weakness   Immunizations Administered    Name Date Dose VIS Date Route   Moderna COVID-19 Vaccine 05/27/2019  9:54 AM 0.5 mL 12/2018 Intramuscular   Manufacturer: Moderna   Lot: [id number]   [id number]

## 2019-09-01 ENCOUNTER — Ambulatory Visit: Payer: Federal, State, Local not specified - PPO | Admitting: Internal Medicine

## 2019-09-29 ENCOUNTER — Ambulatory Visit: Payer: Federal, State, Local not specified - PPO | Admitting: Family

## 2019-11-14 ENCOUNTER — Other Ambulatory Visit: Payer: Self-pay | Admitting: Internal Medicine

## 2019-12-17 DIAGNOSIS — R42 Dizziness and giddiness: Secondary | ICD-10-CM | POA: Diagnosis not present

## 2019-12-17 DIAGNOSIS — E119 Type 2 diabetes mellitus without complications: Secondary | ICD-10-CM | POA: Diagnosis not present

## 2020-02-03 ENCOUNTER — Other Ambulatory Visit: Payer: Self-pay | Admitting: Internal Medicine

## 2020-02-06 ENCOUNTER — Other Ambulatory Visit: Payer: Self-pay | Admitting: Internal Medicine

## 2020-07-31 ENCOUNTER — Other Ambulatory Visit: Payer: Self-pay

## 2020-07-31 ENCOUNTER — Ambulatory Visit: Payer: Federal, State, Local not specified - PPO | Admitting: Podiatry

## 2020-07-31 DIAGNOSIS — B353 Tinea pedis: Secondary | ICD-10-CM | POA: Diagnosis not present

## 2020-07-31 MED ORDER — CLOTRIMAZOLE-BETAMETHASONE 1-0.05 % EX CREA: 1.0000 "application " | TOPICAL_CREAM | Freq: Two times a day (BID) | CUTANEOUS | 2 refills | Status: DC

## 2020-08-01 ENCOUNTER — Encounter: Payer: Self-pay | Admitting: Podiatry

## 2020-08-01 NOTE — Progress Notes (Signed)
Subjective:  Patient ID: [name], female    DOB: June 28, 1965,  MRN: [id number]  Chief Complaint  Patient presents with   Foot Pain    PT is concerned about her feet peeling  Some itching but no pain or burning     55 y.o. female presents with the above complaint.  Patient presents with bilateral plantar foot itching with epidermal lysis.  No pain or burning.  She states been going for quite some time has progressed to gotten worse.  She would like to discuss treatment options for it.  She has tried applying some over-the-counter medication none of which has helped.  She denies any other acute complaints she has been known to go barefooted in public spacing.   Review of Systems: Negative except as noted in the HPI. Denies N/V/F/Ch.  Past Medical History:  Diagnosis Date   Cough    taking Sudafed as needed.Iophen C as needed also for cough.No fever   DIAB W/UNSPEC COMP TYPE II/UNSPEC TYPE UNCNTRL 08/24/2008   takes Metformin,CInnamon,and Glipizide daily   Eczema    Family history of adverse reaction to anesthesia    dad slow to wake up   [place], NO ESOPHAGITIS 03/18/2006   doesn't take any meds   History of blood clots 8+ yrs ago   right calf   HYPERTENSION, BENIGN SYSTEMIC 03/18/2006   takes Lisinopril-HCTZ daily   Joint pain    Joint swelling    OBESITY, NOS 03/18/2006   PONV (postoperative nausea and vomiting)    Seasonal allergies    takes Zyrtec and Claritin daily as well as Fluticasone    Current Outpatient Medications:    clotrimazole-betamethasone (LOTRISONE) cream, Apply 1 application topically 2 (two) times daily., Disp: 30 g, Rfl: 2   aspirin 325 MG tablet, Take 325 mg by mouth 2 (two) times daily. , Disp: , Rfl:    Biotin 1000 MCG tablet, Take 1,000 mcg by mouth daily., Disp: , Rfl:    blood glucose meter kit and supplies, Dispense based on patient and insurance preference. Use up to four times daily as directed. (FOR ICD-10 E10.9, E11.9).,  Disp: 1 each, Rfl: 0   cetirizine (ZYRTEC) 10 MG tablet, Take 1 tablet (10 mg total) by mouth daily. (Patient taking differently: Take 10 mg by mouth at bedtime. ), Disp: 30 tablet, Rfl: 11   Diclofenac Sodium (PENNSAID) 2 % SOLN, Place 2 g onto the skin 2 (two) times daily., Disp: 112 g, Rfl: 3   estradiol (ESTRACE VAGINAL) 0.1 MG/GM vaginal cream, Place 1 Applicatorful vaginally at bedtime., Disp: 42.5 g, Rfl: 12   etonogestrel (IMPLANON) 68 MG IMPL implant, 1 each by Subdermal route once., Disp: , Rfl:    fluticasone (FLONASE) 50 MCG/ACT nasal spray, Place 2 sprays into both nostrils daily., Disp: 32 g, Rfl: 11   glucose blood (ONETOUCH VERIO) test strip, 2x daily, Disp: 100 each, Rfl: 12   Ibuprofen-Famotidine (DUEXIS) 800-26.6 MG TABS, Take 1 tablet by mouth 3 (three) times daily., Disp: 90 tablet, Rfl: 3   Insulin Glargine (BASAGLAR KWIKPEN) 100 UNIT/ML, INJECT 28 UNITS UNDER THE SKIN DAILY, Disp: 15 mL, Rfl: 0   Insulin Pen Needle 31G X 5 MM MISC, 1 Device by Does not apply route daily., Disp: 50 each, Rfl: 11   lisinopril-hydrochlorothiazide (ZESTORETIC) 20-25 MG tablet, TAKE 1 TABLET BY MOUTH DAILY, Disp: 90 tablet, Rfl: 0   loratadine (CLARITIN) 10 MG tablet, Take 10 mg by mouth daily., Disp: , Rfl:  metFORMIN (GLUCOPHAGE) 1000 MG tablet, Take 1 tablet (1,000 mg total) by mouth 2 (two) times daily with a meal. TAKE 1 TABLET (1,000 MG TOTAL) BY MOUTH 2 (TWO) TIMES DAILY WITH A MEAL., Disp: 180 tablet, Rfl: 3   montelukast (SINGULAIR) 10 MG tablet, TAKE 1 TABLET(10 MG) BY MOUTH AT BEDTIME, Disp: 90 tablet, Rfl: 0   triamcinolone ointment (KENALOG) 0.5 %, APPLY EXTERNALLY TO THE AFFECTED AREA TWICE DAILY, Disp: 30 g, Rfl: 0   vitamin C (ASCORBIC ACID) 500 MG tablet, Take 500 mg by mouth daily., Disp: , Rfl:   Social History   Tobacco Use  Smoking Status Never  Smokeless Tobacco Never    Allergies  Allergen Reactions   Trulicity [Dulaglutide] Other (See Comments)    Yeast  infections, hair breakage   Objective:  There were no vitals filed for this visit. There is no height or weight on file to calculate BMI. Constitutional Well developed. Well nourished.  Vascular Dorsalis pedis pulses palpable bilaterally. Posterior tibial pulses palpable bilaterally. Capillary refill normal to all digits.  No cyanosis or clubbing noted. Pedal hair growth normal.  Neurologic Normal speech. Oriented to person, place, and time. Epicritic sensation to light touch grossly present bilaterally.  Dermatologic Epidermal lysis noted with subjective component of itching noted to bone plantar foot.  No open ulceration or lesions noted.  No psoriasis noted.  No xerosis noted.  Orthopedic: Normal joint ROM without pain or crepitus bilaterally. No visible deformities. No bony tenderness.   Radiographs: None Assessment:   1. Tinea pedis of both feet    Plan:  Patient was evaluated and treated and all questions answered.  Bilateral athlete's foot -I explained to the patient the etiology of athlete's foot and various treatment options were extensively discussed.  Given the amount of athlete's foot that is present I believe she will benefit from Lotrisone cream to be be applied twice a day.  If it does not get result we will discuss oral medication at next clinical visit.  Patient states understanding would like to proceed with topical medication -Lotrisone cream was sent to the pharmacy to be taken twice a day until resolve meant.  No follow-ups on file.

## 2020-09-27 ENCOUNTER — Other Ambulatory Visit: Payer: Self-pay

## 2020-09-27 ENCOUNTER — Ambulatory Visit: Payer: Federal, State, Local not specified - PPO | Admitting: Podiatry

## 2020-09-27 DIAGNOSIS — B353 Tinea pedis: Secondary | ICD-10-CM

## 2020-09-27 MED ORDER — CLOTRIMAZOLE-BETAMETHASONE 1-0.05 % EX CREA: 1.0000 "application " | TOPICAL_CREAM | Freq: Two times a day (BID) | CUTANEOUS | 0 refills | Status: DC

## 2020-10-02 NOTE — Progress Notes (Signed)
Subjective:  Patient ID: [name], female    DOB: 1965-06-11,  MRN: [id number]  Chief Complaint  Patient presents with   Tinea Pedis    Pt stated that she thinks her feet are doing better     55 y.o. female presents with the above complaint.  Patient presents for follow-up of bilateral tinea pedis.  Patient states that it has improved considerably.  She has been applying Lotrisone cream twice a day.  She would like another refill on it.  Overall the dryness has improved considerably.  She denies any other acute complaints.   Review of Systems: Negative except as noted in the HPI. Denies N/V/F/Ch.  Past Medical History:  Diagnosis Date   Cough    taking Sudafed as needed.Iophen C as needed also for cough.No fever   DIAB W/UNSPEC COMP TYPE II/UNSPEC TYPE UNCNTRL 08/24/2008   takes Metformin,CInnamon,and Glipizide daily   Eczema    Family history of adverse reaction to anesthesia    dad slow to wake up   [place], NO ESOPHAGITIS 03/18/2006   doesn't take any meds   History of blood clots 8+ yrs ago   right calf   HYPERTENSION, BENIGN SYSTEMIC 03/18/2006   takes Lisinopril-HCTZ daily   Joint pain    Joint swelling    OBESITY, NOS 03/18/2006   PONV (postoperative nausea and vomiting)    Seasonal allergies    takes Zyrtec and Claritin daily as well as Fluticasone    Current Outpatient Medications:    clotrimazole-betamethasone (LOTRISONE) cream, Apply 1 application topically 2 (two) times daily., Disp: 30 g, Rfl: 0   aspirin 325 MG tablet, Take 325 mg by mouth 2 (two) times daily. , Disp: , Rfl:    Biotin 1000 MCG tablet, Take 1,000 mcg by mouth daily., Disp: , Rfl:    blood glucose meter kit and supplies, Dispense based on patient and insurance preference. Use up to four times daily as directed. (FOR ICD-10 E10.9, E11.9)., Disp: 1 each, Rfl: 0   cetirizine (ZYRTEC) 10 MG tablet, Take 1 tablet (10 mg total) by mouth daily. (Patient taking differently: Take  10 mg by mouth at bedtime. ), Disp: 30 tablet, Rfl: 11   clotrimazole-betamethasone (LOTRISONE) cream, Apply 1 application topically 2 (two) times daily., Disp: 30 g, Rfl: 2   Diclofenac Sodium (PENNSAID) 2 % SOLN, Place 2 g onto the skin 2 (two) times daily., Disp: 112 g, Rfl: 3   estradiol (ESTRACE VAGINAL) 0.1 MG/GM vaginal cream, Place 1 Applicatorful vaginally at bedtime., Disp: 42.5 g, Rfl: 12   etonogestrel (IMPLANON) 68 MG IMPL implant, 1 each by Subdermal route once., Disp: , Rfl:    fluticasone (FLONASE) 50 MCG/ACT nasal spray, Place 2 sprays into both nostrils daily., Disp: 32 g, Rfl: 11   glucose blood (ONETOUCH VERIO) test strip, 2x daily, Disp: 100 each, Rfl: 12   Ibuprofen-Famotidine (DUEXIS) 800-26.6 MG TABS, Take 1 tablet by mouth 3 (three) times daily., Disp: 90 tablet, Rfl: 3   Insulin Glargine (BASAGLAR KWIKPEN) 100 UNIT/ML, INJECT 28 UNITS UNDER THE SKIN DAILY, Disp: 15 mL, Rfl: 0   Insulin Pen Needle 31G X 5 MM MISC, 1 Device by Does not apply route daily., Disp: 50 each, Rfl: 11   lisinopril-hydrochlorothiazide (ZESTORETIC) 20-25 MG tablet, TAKE 1 TABLET BY MOUTH DAILY, Disp: 90 tablet, Rfl: 0   loratadine (CLARITIN) 10 MG tablet, Take 10 mg by mouth daily., Disp: , Rfl:    metFORMIN (GLUCOPHAGE) 1000 MG tablet, Take 1  tablet (1,000 mg total) by mouth 2 (two) times daily with a meal. TAKE 1 TABLET (1,000 MG TOTAL) BY MOUTH 2 (TWO) TIMES DAILY WITH A MEAL., Disp: 180 tablet, Rfl: 3   montelukast (SINGULAIR) 10 MG tablet, TAKE 1 TABLET(10 MG) BY MOUTH AT BEDTIME, Disp: 90 tablet, Rfl: 0   triamcinolone ointment (KENALOG) 0.5 %, APPLY EXTERNALLY TO THE AFFECTED AREA TWICE DAILY, Disp: 30 g, Rfl: 0   vitamin C (ASCORBIC ACID) 500 MG tablet, Take 500 mg by mouth daily., Disp: , Rfl:   Social History   Tobacco Use  Smoking Status Never  Smokeless Tobacco Never    Allergies  Allergen Reactions   Trulicity [Dulaglutide] Other (See Comments)    Yeast infections, hair breakage    Objective:  There were no vitals filed for this visit. There is no height or weight on file to calculate BMI. Constitutional Well developed. Well nourished.  Vascular Dorsalis pedis pulses palpable bilaterally. Posterior tibial pulses palpable bilaterally. Capillary refill normal to all digits.  No cyanosis or clubbing noted. Pedal hair growth normal.  Neurologic Normal speech. Oriented to person, place, and time. Epicritic sensation to light touch grossly present bilaterally.  Dermatologic No further epidermal lysis noted with subjective component of itching noted to bone plantar foot.  No open ulceration or lesions noted.  No psoriasis noted.  No xerosis noted.  Orthopedic: Normal joint ROM without pain or crepitus bilaterally. No visible deformities. No bony tenderness.   Radiographs: None Assessment:   1. Tinea pedis of both feet     Plan:  Patient was evaluated and treated and all questions answered.  Bilateral athlete's foot -I explained to the patient the etiology of athlete's foot and various treatment options were extensively discussed.  Clinically the athlete's foot is resolving and is improving considerably.  I discussed with her to continue using Lotrisone cream as needed at this point.  Her tinea pedis has resolved. -Lotrisone cream was refilled.  No follow-ups on file.

## 2021-05-27 ENCOUNTER — Ambulatory Visit: Payer: Federal, State, Local not specified - PPO | Admitting: Sports Medicine

## 2021-05-27 VITALS — BP 132/80 | HR 84 | Ht 60.0 in | Wt 229.0 lb

## 2021-05-27 DIAGNOSIS — M722 Plantar fascial fibromatosis: Secondary | ICD-10-CM | POA: Diagnosis not present

## 2021-05-27 MED ORDER — MELOXICAM 15 MG PO TABS: 15.0000 mg | ORAL_TABLET | Freq: Every day | ORAL | 0 refills | Status: DC

## 2021-05-27 NOTE — Progress Notes (Signed)
? ? [name] D.Merril Abbe ?[facility] ?[place] ?Phone: [phone] ?  ?Assessment and Plan:   ?  ?1. Plantar fasciitis of left foot ?-Acute, uncomplicated, initial sports medicine visit ?- Likely Planter fasciitis of left foot caused by wearing flat shoes with pes planus based on HPI, physical exam ?- Start meloxicam 15 mg daily x2 weeks.  If still having pain after 2 weeks, complete 3rd-week of meloxicam. May use remaining meloxicam as needed once daily for pain control.  Do not to use additional NSAIDs while taking meloxicam.  May use Tylenol [phone] mg 2 to 3 times a day for breakthrough pain. ?- Start HEP for Planter fasciitis ?- Recommend wearing shoes with arch support ?  ?Pertinent previous records reviewed include none ?  ?Follow Up: 3 weeks for reevaluation.  Could consider ultrasound versus x-ray versus CSI if no improvement or worsening of symptoms ?  ?Subjective:   ?I, Pincus Badder, am serving as a Education administrator for [employer] ? ?Chief Complaint: left foot pain  ? ?HPI:  ? ?05/27/21 ?Patient is a 56 year old female complaining of left foot pain. Patient states that yesterday she wore vans and Sunday she wore hey dude and she now has pain from great toe to heel and when she moves her foot a certain way she gets pain, did have some numbness and tingling, no clicking or popping, has pain when walking , has tried ib and tylenol and that has been helping a little, no MOI  ? ?Relevant Historical Information: DM type II, hypertension ? ?Additional pertinent review of systems negative. ? ? ?Current Outpatient Medications:  ?  Biotin 1000 MCG tablet, Take 1,000 mcg by mouth daily., Disp: , Rfl:  ?  blood glucose meter kit and supplies, Dispense based on patient and insurance preference. Use up to four times daily as directed. (FOR ICD-10 E10.9, E11.9)., Disp: 1 each, Rfl: 0 ?  clotrimazole-betamethasone (LOTRISONE) cream, Apply 1 application  topically 2 (two) times daily., Disp: 30 g, Rfl: 2 ?  clotrimazole-betamethasone (LOTRISONE) cream, Apply 1 application topically 2 (two) times daily., Disp: 30 g, Rfl: 0 ?  etonogestrel (NEXPLANON) 68 MG IMPL implant, 1 each by Subdermal route once., Disp: , Rfl:  ?  glucose blood (ONETOUCH VERIO) test strip, 2x daily, Disp: 100 each, Rfl: 12 ?  lisinopril-hydrochlorothiazide (ZESTORETIC) 20-25 MG tablet, TAKE 1 TABLET BY MOUTH DAILY, Disp: 90 tablet, Rfl: 0 ?  loratadine (CLARITIN) 10 MG tablet, Take 10 mg by mouth daily., Disp: , Rfl:  ?  meloxicam (MOBIC) 15 MG tablet, Take 1 tablet (15 mg total) by mouth daily., Disp: 30 tablet, Rfl: 0 ?  triamcinolone ointment (KENALOG) 0.5 %, APPLY EXTERNALLY TO THE AFFECTED AREA TWICE DAILY, Disp: 30 g, Rfl: 0 ?  vitamin C (ASCORBIC ACID) 500 MG tablet, Take 500 mg by mouth daily., Disp: , Rfl:  ?  aspirin 325 MG tablet, Take 325 mg by mouth 2 (two) times daily.  (Patient not taking: Reported on 05/27/2021), Disp: , Rfl:  ?  cetirizine (ZYRTEC) 10 MG tablet, Take 1 tablet (10 mg total) by mouth daily. (Patient taking differently: Take 10 mg by mouth at bedtime. ), Disp: 30 tablet, Rfl: 11 ?  fluticasone (FLONASE) 50 MCG/ACT nasal spray, Place 2 sprays into both nostrils daily., Disp: 32 g, Rfl: 11  ? ?Objective:   ?  ?Vitals:  ? 05/27/21 1405  ?BP: 132/80  ?Pulse: 84  ?SpO2: 98%  ?Weight:  229 lb (103.9 kg)  ?Height: 5' (1.524 m)  ?  ?  ?Body mass index is 44.72 kg/m?.  ?  ?Physical Exam:   ? ?Gen: Appears well, nad, nontoxic and pleasant ?Psych: Alert and oriented, appropriate mood and affect ?Neuro: sensation intact, strength is 5/5 with df/pf/inv/ev, muscle tone wnl ?Skin: no susupicious lesions or rashes ? ?Left foot/ankle: no deformity, no swelling or effusion ?Mild pes planus ?TTP along plantar fascia and most significant at origin of anterior calcaneus ?NTTP over fibular head, lat mal, medial mal, achilles, navicular, base of 5th, ATFL, CFL, deltoid, calcaneous or  midfoot ?ROM DF 30, PF 45, inv/ev intact ?Negative ant drawer, talar tilt, rotation test, squeeze test. ?Neg thompson ?No pain with resisted inversion or eversion  ? ? ?Electronically signed by:  ?[name] D.Merril Abbe ?[facility] ?2:35 PM 05/27/21 ?

## 2021-05-27 NOTE — Patient Instructions (Addendum)
Good to see you  ?Work note provided ?- Start meloxicam 15 mg daily x2 weeks.  If still having pain after 2 weeks, complete 3rd-week of meloxicam. May use remaining meloxicam as needed once daily for pain control.  Do not to use additional NSAIDs while taking meloxicam.  May use Tylenol [phone] mg 2 to 3 times a day for breakthrough pain. ?Plantar fasciitis HEP  ?Recommend wearing shoes with arch support ?3 week follow up  ?

## 2021-05-29 ENCOUNTER — Ambulatory Visit: Payer: Federal, State, Local not specified - PPO | Admitting: Podiatry

## 2021-06-12 NOTE — Progress Notes (Signed)
[name] D.[facility] Phone: [phone]   Assessment and Plan:     1. Plantar fasciitis of left foot -Chronic with exacerbation, subsequent visit - Significant improvement in left foot plantar fasciitis after completion of 3-week course of meloxicam 15 mg daily - Discontinue daily use of meloxicam and may use remainder as needed - Recommend buying over-the-counter inserts to try and decrease flares of Planter fasciitis.  Recommended arch support with patient having moderate pes planus bilaterally.  Fleet feet is an option patient could try   Pertinent previous records reviewed include none   Follow Up: As needed   Subjective:   I, Moenique Parris, am serving as a Education administrator for [employer]   Chief Complaint: left foot pain    HPI:    05/27/21 Patient is a 56 year old female complaining of left foot pain. Patient states that yesterday she wore vans and Sunday she wore hey dude and she now has pain from great toe to heel and when she moves her foot a certain way she gets pain, did have some numbness and tingling, no clicking or popping, has pain when walking , has tried ib and tylenol and that has been helping a little, no MOI   06/17/2021 Patient states that she is great much better than when she first came    Relevant Historical Information: DM type II, hypertension  Additional pertinent review of systems negative.   Current Outpatient Medications:    aspirin 325 MG tablet, Take 325 mg by mouth 2 (two) times daily., Disp: , Rfl:    Biotin 1000 MCG tablet, Take 1,000 mcg by mouth daily., Disp: , Rfl:    blood glucose meter kit and supplies, Dispense based on patient and insurance preference. Use up to four times daily as directed. (FOR ICD-10 E10.9, E11.9)., Disp: 1 each, Rfl: 0   clotrimazole-betamethasone (LOTRISONE) cream, Apply 1 application topically 2 (two) times daily., Disp: 30 g,  Rfl: 2   clotrimazole-betamethasone (LOTRISONE) cream, Apply 1 application topically 2 (two) times daily., Disp: 30 g, Rfl: 0   etonogestrel (NEXPLANON) 68 MG IMPL implant, 1 each by Subdermal route once., Disp: , Rfl:    glucose blood (ONETOUCH VERIO) test strip, 2x daily, Disp: 100 each, Rfl: 12   lisinopril-hydrochlorothiazide (ZESTORETIC) 20-25 MG tablet, TAKE 1 TABLET BY MOUTH DAILY, Disp: 90 tablet, Rfl: 0   loratadine (CLARITIN) 10 MG tablet, Take 10 mg by mouth daily., Disp: , Rfl:    meloxicam (MOBIC) 15 MG tablet, Take 1 tablet (15 mg total) by mouth daily., Disp: 30 tablet, Rfl: 0   triamcinolone ointment (KENALOG) 0.5 %, APPLY EXTERNALLY TO THE AFFECTED AREA TWICE DAILY, Disp: 30 g, Rfl: 0   vitamin C (ASCORBIC ACID) 500 MG tablet, Take 500 mg by mouth daily., Disp: , Rfl:    cetirizine (ZYRTEC) 10 MG tablet, Take 1 tablet (10 mg total) by mouth daily. (Patient taking differently: Take 10 mg by mouth at bedtime. ), Disp: 30 tablet, Rfl: 11   fluticasone (FLONASE) 50 MCG/ACT nasal spray, Place 2 sprays into both nostrils daily., Disp: 32 g, Rfl: 11   Objective:     Vitals:   06/17/21 1549  BP: 122/82  Weight: 229 lb (103.9 kg)  Height: 5' (1.524 m)      Body mass index is 44.72 kg/m.    Physical Exam:    Gen: Appears well, nad, nontoxic and pleasant Psych: Alert  and oriented, appropriate mood and affect Neuro: sensation intact, strength is 5/5 with df/pf/inv/ev, muscle tone wnl Skin: no susupicious lesions or rashes  Left foot/ankle: no deformity, no swelling or effusion NTTP over fibular head, lat mal, medial mal, achilles, navicular, base of 5th, ATFL, CFL, deltoid, calcaneous or midfoot ROM DF 30, PF 45, inv/ev intact Negative ant drawer, talar tilt, rotation test, squeeze test. Neg thompson No pain with resisted inversion or eversion    Electronically signed by:  [name] D.[facility] 4:24 PM 06/17/21

## 2021-06-17 ENCOUNTER — Ambulatory Visit: Payer: Federal, State, Local not specified - PPO | Admitting: Sports Medicine

## 2021-06-17 VITALS — BP 122/82 | Ht 60.0 in | Wt 229.0 lb

## 2021-06-17 DIAGNOSIS — M722 Plantar fascial fibromatosis: Secondary | ICD-10-CM | POA: Diagnosis not present

## 2021-06-17 NOTE — Patient Instructions (Addendum)
Good to see you  Can try fleet feet for help finding and insert to help decrease plantar fascitis flare As needed follow up

## 2021-06-29 ENCOUNTER — Other Ambulatory Visit: Payer: Self-pay | Admitting: Sports Medicine

## 2021-09-25 ENCOUNTER — Ambulatory Visit (HOSPITAL_COMMUNITY)
Admission: EM | Admit: 2021-09-25 | Discharge: 2021-09-25 | Disposition: A | Discharge status: Home / Self Care | Payer: Federal, State, Local not specified - PPO | Attending: Internal Medicine | Admitting: Internal Medicine

## 2021-09-25 ENCOUNTER — Encounter (HOSPITAL_COMMUNITY): Payer: Self-pay | Admitting: Emergency Medicine

## 2021-09-25 DIAGNOSIS — J069 Acute upper respiratory infection, unspecified: Secondary | ICD-10-CM | POA: Diagnosis not present

## 2021-09-25 MED ORDER — PREDNISONE 20 MG PO TABS: 20.0000 mg | ORAL_TABLET | Freq: Every day | ORAL | 0 refills | Status: AC

## 2021-09-25 MED ORDER — IPRATROPIUM BROMIDE 0.03 % NA SOLN: 2.0000 | Freq: Two times a day (BID) | NASAL | 12 refills | Status: DC

## 2021-09-25 MED ORDER — BENZONATATE 100 MG PO CAPS: 100.0000 mg | ORAL_CAPSULE | Freq: Three times a day (TID) | ORAL | 0 refills | Status: DC | PRN

## 2021-09-25 NOTE — ED Provider Notes (Signed)
[place]    CSN: [id number] Arrival date & time: 09/25/21  2703      History   Chief Complaint Chief Complaint  Patient presents with   Cough   Nasal Congestion    HPI [name] is a 56 y.o. female was to the urgent care comes to the urgent care with a 1 week history of nasal congestion, cough and hoarseness of voice.  Patient says symptoms started insidiously and has been persistent.  She has tried Mucinex DM, DayQuil, Flonase and Zyrtec without relief.  Patient endorses postnasal drainage at bedtime.  She denies any shortness of breath or wheezing.  No facial pain or periorbital swelling.  No nausea, vomiting or diarrhea.  Patient's grand child has runny nose.  Patient is fully vaccinated against COVID-19 virus.  She denies any generalized body aches.  She has been using a humidifier with vapor rub with no improvement in her symptoms.  HPI  Past Medical History:  Diagnosis Date   Cough    taking Sudafed as needed.Iophen C as needed also for cough.No fever   DIAB W/UNSPEC COMP TYPE II/UNSPEC TYPE UNCNTRL 08/24/2008   takes Metformin,CInnamon,and Glipizide daily   Eczema    Family history of adverse reaction to anesthesia    dad slow to wake up   [place], NO ESOPHAGITIS 03/18/2006   doesn't take any meds   History of blood clots 8+ yrs ago   right calf   HYPERTENSION, BENIGN SYSTEMIC 03/18/2006   takes Lisinopril-HCTZ daily   Joint pain    Joint swelling    OBESITY, NOS 03/18/2006   PONV (postoperative nausea and vomiting)    Seasonal allergies    takes Zyrtec and Claritin daily as well as Fluticasone    Patient Active Problem List   Diagnosis Date Noted   Abscess of abdominal wall 11/18/2018   Left Achilles tendinitis 12/22/2016   Pain of left heel 12/14/2016   Routine general medical examination at a health care facility 04/10/2016   Hyperlipidemia associated with type 2 diabetes mellitus ([place]) 09/26/2014   Allergic rhinitis  06/30/2013   Uncontrolled diabetes mellitus 08/24/2008   Morbid obesity with BMI of 45.0-49.9, adult ([place]) 03/18/2006   HYPERTENSION, BENIGN SYSTEMIC 03/18/2006   GASTROESOPHAGEAL REFLUX, NO ESOPHAGITIS 03/18/2006    Past Surgical History:  Procedure Laterality Date   ABLATION     BREAST BIOPSY Left 10/30/2015   CHOLECYSTECTOMY     ORIF ANKLE FRACTURE Right 02/01/2015   Procedure: OPEN REDUCTION INTERNAL FIXATION RIGHT ANKLE FRACTURE;  Surgeon: Mcarthur Rossetti, MD;  Location: [facility];  Service: Orthopedics;  Laterality: Right;   TUBAL LIGATION  2004    OB History   No obstetric history on file.      Home Medications    Prior to Admission medications   Medication Sig Start Date End Date Taking? Authorizing Provider  benzonatate (TESSALON) 100 MG capsule Take 1 capsule (100 mg total) by mouth 3 (three) times daily as needed for cough. 09/25/21  Yes Lavender Stanke, Myrene Galas, MD  ipratropium (ATROVENT) 0.03 % nasal spray Place 2 sprays into both nostrils every 12 (twelve) hours. 09/25/21  Yes Nicki Gracy, Myrene Galas, MD  predniSONE (DELTASONE) 20 MG tablet Take 1 tablet (20 mg total) by mouth daily for 5 days. 09/25/21 09/30/21 Yes Xochilth Standish, Myrene Galas, MD  Biotin 1000 MCG tablet Take 1,000 mcg by mouth daily.    [provider]  blood glucose meter kit and supplies Dispense based on patient  and insurance preference. Use up to four times daily as directed. (FOR ICD-10 E10.9, E11.9). 08/06/17   Hoyt Koch, MD  cetirizine (ZYRTEC) 10 MG tablet Take 1 tablet (10 mg total) by mouth daily. Patient taking differently: Take 10 mg by mouth at bedtime.  01/06/13 12/29/15  Marin Olp, MD  etonogestrel (NEXPLANON) 68 MG IMPL implant 1 each by Subdermal route once.    [provider]  fluticasone (FLONASE) 50 MCG/ACT nasal spray Place 2 sprays into both nostrils daily. 02/25/18 02/25/19  Hoyt Koch, MD  glucose blood [facility] VERIO) test strip 2x daily 02/03/19    Shamleffer, Melanie Crazier, MD  JARDIANCE 25 MG TABS tablet Take 25 mg by mouth every morning. 06/25/21   [provider]  lisinopril-hydrochlorothiazide (ZESTORETIC) 20-25 MG tablet TAKE 1 TABLET BY MOUTH DAILY 11/17/19   Hoyt Koch, MD  loratadine (CLARITIN) 10 MG tablet Take 10 mg by mouth daily.    [provider]  meloxicam (MOBIC) 15 MG tablet Take 1 tablet (15 mg total) by mouth daily. 05/27/21   Glennon Mac, DO  OZEMPIC, 1 MG/DOSE, 4 MG/3ML SOPN Inject 1 mg into the skin once a week. 07/24/21   [provider]  triamcinolone ointment (KENALOG) 0.5 % APPLY EXTERNALLY TO THE AFFECTED AREA TWICE DAILY 08/06/17   Hoyt Koch, MD  vitamin C (ASCORBIC ACID) 500 MG tablet Take 500 mg by mouth daily.    [provider]    Family History Family History  Problem Relation Age of Onset   Hypertension Mother    Diabetes Mother    Hypertension Father     Social History Social History   Tobacco Use   Smoking status: Never   Smokeless tobacco: Never  Substance Use Topics   Alcohol use: No   Drug use: No     Allergies   Trulicity [dulaglutide]   Review of Systems Review of Systems As per HPI  Physical Exam Triage Vital Signs ED Triage Vitals  Enc Vitals Group     BP 09/25/21 1041 113/69     Pulse Rate 09/25/21 1041 96     Resp 09/25/21 1041 19     Temp 09/25/21 1041 97.9 F (36.6 C)     Temp Source 09/25/21 1041 Oral     SpO2 09/25/21 1041 98 %     Weight --      Height --      Head Circumference --      Peak Flow --      Pain Score 09/25/21 1038 0     Pain Loc --      Pain Edu? --      Excl. in [place]? --    No data found.  Updated Vital Signs BP 113/69 (BP Location: Right Arm)   Pulse 96   Temp 97.9 F (36.6 C) (Oral)   Resp 19   SpO2 98%   Visual Acuity Right Eye Distance:   Left Eye Distance:   Bilateral Distance:    Right Eye Near:   Left Eye Near:    Bilateral Near:     Physical Exam Vitals  and nursing note reviewed.  Constitutional:      Appearance: She is not ill-appearing.  HENT:     Right Ear: Tympanic membrane normal.     Left Ear: Tympanic membrane normal.  Eyes:     Extraocular Movements: Extraocular movements intact.  Cardiovascular:     Rate and Rhythm: Normal rate and  regular rhythm.     Pulses: Normal pulses.     Heart sounds: Normal heart sounds.  Pulmonary:     Effort: Pulmonary effort is normal.     Breath sounds: Normal breath sounds.  Neurological:     Mental Status: She is alert.      UC Treatments / Results  Labs (all labs ordered are listed, but only abnormal results are displayed) Labs Reviewed - No data to display  EKG   Radiology No results found.  Procedures Procedures (including critical care time)  Medications Ordered in UC Medications - No data to display  Initial Impression / Assessment and Plan / UC Course  I have reviewed the triage vital signs and the nursing notes.  Pertinent labs & imaging results that were available during my care of the patient were reviewed by me and considered in my medical decision making (see chart for details).     1.  Viral URI with cough: Ipratropium nasal spray No indication for COVID-19 testing given the duration of symptoms Prednisone 20 mg orally daily for 5 days Tessalon Perles as needed for cough Return precautions given Patient's lung exam is unremarkable. Final Clinical Impressions(s) / UC Diagnoses   Final diagnoses:  Viral URI with cough     Discharge Instructions      Please continue Flonase, Mucinex, humidifier/VapoRub use Take medications as prescribed Maintain adequate hydration There is no indication for COVID/testing given the duration of your symptoms.   Return to urgent care if symptoms worsen.   ED Prescriptions     Medication Sig Dispense Auth. Provider   ipratropium (ATROVENT) 0.03 % nasal spray Place 2 sprays into both nostrils every 12 (twelve) hours.  30 mL Tylik Treese, Myrene Galas, MD   predniSONE (DELTASONE) 20 MG tablet Take 1 tablet (20 mg total) by mouth daily for 5 days. 5 tablet Evon Dejarnett, Myrene Galas, MD   benzonatate (TESSALON) 100 MG capsule Take 1 capsule (100 mg total) by mouth 3 (three) times daily as needed for cough. 21 capsule Muadh Creasy, Myrene Galas, MD      PDMP not reviewed this encounter.   Chase Picket, MD 09/25/21 1201

## 2021-09-25 NOTE — ED Triage Notes (Addendum)
Pt having cough, congestion, voice going out for all most a week. Denies pain. Taking Mucinex DM and Dayquil, Flonase and Xyrtec without relief

## 2021-09-25 NOTE — Discharge Instructions (Addendum)
Please continue Flonase, Mucinex, humidifier/VapoRub use Take medications as prescribed Maintain adequate hydration There is no indication for COVID/testing given the duration of your symptoms.   Return to urgent care if symptoms worsen.

## 2022-01-22 NOTE — Progress Notes (Signed)
Benito Mccreedy D.[place] Phone: [phone]   Assessment and Plan:     1. Acute left ankle pain  -Acute, initial sports medicine visit - Unclear etiology of left ankle pain for 1 to 2 days without significant MOI.  Patient is globally mildly tender and has mild to moderate pain with weightbearing, though she is able to do so.  Differential includes acute ankle sprain versus diabetic neuropathy - Start meloxicam 15 mg daily x2 weeks.  If still having pain after 2 weeks, complete 3rd-week of meloxicam. May use remaining meloxicam as needed once daily for pain control.  Do not to use additional NSAIDs while taking meloxicam.  May use Tylenol [phone] mg 2 to 3 times a day for breakthrough pain. -Start HEP for ankle - Work note given for patient to be out of work today  Pertinent previous records reviewed include none   Follow Up: 3 to 4 weeks for reevaluation.  If no improvement or worsening of symptoms, would obtain left foot and ankle x-ray, and would review lab work to include A1c and consider diabetic neuropathy as a potential diagnosis   Subjective:   I, Pincus Badder, am serving as a Education administrator for Doctor Glennon Mac   Chief Complaint: left foot pain    HPI:    05/27/21 Patient is a 57 year old female complaining of left foot pain. Patient states that yesterday she wore vans and Sunday she wore hey dude and she now has pain from great toe to heel and when she moves her foot a certain way she gets pain, did have some numbness and tingling, no clicking or popping, has pain when walking , has tried ib and tylenol and that has been helping a little, no MOI    06/17/2021 Patient states that she is great much better than when she first came   01/23/2022 Patient states foot pain is worse   Relevant Historical Information: DM type II, hypertension  Additional pertinent review of systems  negative.   Current Outpatient Medications:    benzonatate (TESSALON) 100 MG capsule, Take 1 capsule (100 mg total) by mouth 3 (three) times daily as needed for cough., Disp: 21 capsule, Rfl: 0   Biotin 1000 MCG tablet, Take 1,000 mcg by mouth daily., Disp: , Rfl:    blood glucose meter kit and supplies, Dispense based on patient and insurance preference. Use up to four times daily as directed. (FOR ICD-10 E10.9, E11.9)., Disp: 1 each, Rfl: 0   etonogestrel (NEXPLANON) 68 MG IMPL implant, 1 each by Subdermal route once., Disp: , Rfl:    glucose blood (ONETOUCH VERIO) test strip, 2x daily, Disp: 100 each, Rfl: 12   ipratropium (ATROVENT) 0.03 % nasal spray, Place 2 sprays into both nostrils every 12 (twelve) hours., Disp: 30 mL, Rfl: 12   JARDIANCE 25 MG TABS tablet, Take 25 mg by mouth every morning., Disp: , Rfl:    lisinopril-hydrochlorothiazide (ZESTORETIC) 20-25 MG tablet, TAKE 1 TABLET BY MOUTH DAILY, Disp: 90 tablet, Rfl: 0   loratadine (CLARITIN) 10 MG tablet, Take 10 mg by mouth daily., Disp: , Rfl:    meloxicam (MOBIC) 15 MG tablet, Take 1 tablet (15 mg total) by mouth daily., Disp: 30 tablet, Rfl: 0   meloxicam (MOBIC) 15 MG tablet, Take 1 tablet (15 mg total) by mouth daily., Disp: 30 tablet, Rfl: 0   OZEMPIC, 1 MG/DOSE, 4 MG/3ML SOPN, Inject 1 mg into the  skin once a week., Disp: , Rfl:    triamcinolone ointment (KENALOG) 0.5 %, APPLY EXTERNALLY TO THE AFFECTED AREA TWICE DAILY, Disp: 30 g, Rfl: 0   vitamin C (ASCORBIC ACID) 500 MG tablet, Take 500 mg by mouth daily., Disp: , Rfl:    cetirizine (ZYRTEC) 10 MG tablet, Take 1 tablet (10 mg total) by mouth daily. (Patient taking differently: Take 10 mg by mouth at bedtime. ), Disp: 30 tablet, Rfl: 11   fluticasone (FLONASE) 50 MCG/ACT nasal spray, Place 2 sprays into both nostrils daily., Disp: 32 g, Rfl: 11   Objective:     Vitals:   01/23/22 0958  Weight: 229 lb (103.9 kg)  Height: 5' (1.524 m)      Body mass index is 44.72  kg/m.    Physical Exam:    Gen: Appears well, nad, nontoxic and pleasant Psych: Alert and oriented, appropriate mood and affect Neuro: sensation intact, strength is 5/5 with df/pf/inv/ev, muscle tone wnl Skin: no susupicious lesions or rashes  Left ankle: no deformity, no swelling or effusion Nonspecifically, globally tender including TTP over  , lat mal, medial mal, achilles, navicular, base of 5th, ATFL, CFL, deltoid, calcaneous or midfoot ROM DF 30, PF 45, inv/ev intact Negative ant drawer, talar tilt, rotation test, squeeze test. Neg thompson Mild pain with resisted inversion or eversion    Electronically signed by:  Benito Mccreedy D.[facility] 10:16 AM 01/23/22

## 2022-01-23 ENCOUNTER — Ambulatory Visit: Payer: Federal, State, Local not specified - PPO | Admitting: Sports Medicine

## 2022-01-23 VITALS — Ht 60.0 in | Wt 229.0 lb

## 2022-01-23 DIAGNOSIS — M25572 Pain in left ankle and joints of left foot: Secondary | ICD-10-CM | POA: Diagnosis not present

## 2022-01-23 MED ORDER — MELOXICAM 15 MG PO TABS: 15.0000 mg | ORAL_TABLET | Freq: Every day | ORAL | 0 refills | Status: DC

## 2022-01-23 NOTE — Patient Instructions (Signed)
-   Start meloxicam 15 mg daily x2 weeks.  If still having pain after 2 weeks, complete 3rd-week of meloxicam. May use remaining meloxicam as needed once daily for pain control.  Do not to use additional NSAIDs while taking meloxicam.  May use Tylenol [phone] mg 2 to 3 times a day for breakthrough pain. Ankle HEP  Work note  3-4 week follow up

## 2022-02-13 ENCOUNTER — Ambulatory Visit: Payer: Federal, State, Local not specified - PPO | Admitting: Sports Medicine

## 2022-08-31 ENCOUNTER — Other Ambulatory Visit (HOSPITAL_COMMUNITY): Payer: Self-pay

## 2022-09-01 ENCOUNTER — Other Ambulatory Visit (HOSPITAL_COMMUNITY): Payer: Self-pay

## 2022-09-01 MED ORDER — MOUNJARO 10 MG/0.5ML ~~LOC~~ SOPN
10.0000 mg | PEN_INJECTOR | SUBCUTANEOUS | 0 refills | Status: AC
Start: 2022-09-01 — End: ?
  Filled 2022-09-01 – 2022-09-12 (×2): qty 2, 28d supply, fill #0

## 2022-09-12 ENCOUNTER — Other Ambulatory Visit (HOSPITAL_COMMUNITY): Payer: Self-pay

## 2022-10-02 ENCOUNTER — Inpatient Hospital Stay: Payer: Federal, State, Local not specified - PPO

## 2022-10-02 ENCOUNTER — Inpatient Hospital Stay
Payer: Federal, State, Local not specified - PPO | Attending: Hematology and Oncology | Admitting: Hematology and Oncology

## 2022-10-02 VITALS — BP 102/74 | HR 105 | Temp 98.1°F | Resp 17 | Wt 217.4 lb

## 2022-10-02 DIAGNOSIS — K219 Gastro-esophageal reflux disease without esophagitis: Secondary | ICD-10-CM | POA: Insufficient documentation

## 2022-10-02 DIAGNOSIS — I10 Essential (primary) hypertension: Secondary | ICD-10-CM | POA: Diagnosis not present

## 2022-10-02 DIAGNOSIS — Z79899 Other long term (current) drug therapy: Secondary | ICD-10-CM | POA: Insufficient documentation

## 2022-10-02 DIAGNOSIS — D7219 Other eosinophilia: Secondary | ICD-10-CM | POA: Diagnosis not present

## 2022-10-02 DIAGNOSIS — R7989 Other specified abnormal findings of blood chemistry: Secondary | ICD-10-CM | POA: Diagnosis present

## 2022-10-02 DIAGNOSIS — E669 Obesity, unspecified: Secondary | ICD-10-CM | POA: Insufficient documentation

## 2022-10-02 DIAGNOSIS — Z86718 Personal history of other venous thrombosis and embolism: Secondary | ICD-10-CM | POA: Insufficient documentation

## 2022-10-02 LAB — CBC WITH DIFFERENTIAL (CANCER CENTER ONLY)
Abs Immature Granulocytes: 0.03 10*3/uL (ref 0.00–0.07)
Basophils Absolute: 0 10*3/uL (ref 0.0–0.1)
Basophils Relative: 0 %
Eosinophils Absolute: 0.5 10*3/uL (ref 0.0–0.5)
Eosinophils Relative: 5 %
HCT: 38.9 % (ref 36.0–46.0)
Hemoglobin: 12.5 g/dL (ref 12.0–15.0)
Immature Granulocytes: 0 %
Lymphocytes Relative: 26 %
Lymphs Abs: 2.9 10*3/uL (ref 0.7–4.0)
MCH: 29.3 pg (ref 26.0–34.0)
MCHC: 32.1 g/dL (ref 30.0–36.0)
MCV: 91.1 fL (ref 80.0–100.0)
Monocytes Absolute: 1 10*3/uL (ref 0.1–1.0)
Monocytes Relative: 9 %
Neutro Abs: 6.4 10*3/uL (ref 1.7–7.7)
Neutrophils Relative %: 60 %
Platelet Count: 251 10*3/uL (ref 150–400)
RBC: 4.27 MIL/uL (ref 3.87–5.11)
RDW: 12.9 % (ref 11.5–15.5)
WBC Count: 10.8 10*3/uL — ABNORMAL HIGH (ref 4.0–10.5)
nRBC: 0 % (ref 0.0–0.2)

## 2022-10-02 LAB — CMP (CANCER CENTER ONLY)
ALT: 19 U/L (ref 0–44)
AST: 17 U/L (ref 15–41)
Albumin: 4 g/dL (ref 3.5–5.0)
Alkaline Phosphatase: 129 U/L — ABNORMAL HIGH (ref 38–126)
Anion gap: 7 (ref 5–15)
BUN: 22 mg/dL — ABNORMAL HIGH (ref 6–20)
CO2: 29 mmol/L (ref 22–32)
Calcium: 9.6 mg/dL (ref 8.9–10.3)
Chloride: 103 mmol/L (ref 98–111)
Creatinine: 1.25 mg/dL — ABNORMAL HIGH (ref 0.44–1.00)
GFR, Estimated: 51 mL/min — ABNORMAL LOW (ref >60)
Glucose, Bld: 182 mg/dL — ABNORMAL HIGH (ref 70–99)
Potassium: 3.8 mmol/L (ref 3.5–5.1)
Sodium: 139 mmol/L (ref 135–145)
Total Bilirubin: 0.2 mg/dL — ABNORMAL LOW (ref 0.3–1.2)
Total Protein: 7.4 g/dL (ref 6.5–8.1)

## 2022-10-02 LAB — LACTATE DEHYDROGENASE: LDH: 170 U/L (ref 98–192)

## 2022-10-02 LAB — C-REACTIVE PROTEIN: CRP: 0.9 mg/dL (ref <1.0)

## 2022-10-02 LAB — SEDIMENTATION RATE: Sed Rate: 44 mm/h — ABNORMAL HIGH (ref 0–22)

## 2022-10-03 NOTE — Progress Notes (Signed)
[facility] [phone]   [phone]  INITIAL CONSULT NOTE  Patient Care Team: System, Provider Not In as PCP - General  Hematological/Oncological History # Elevated Monocytes/Eosinophils 08/28/2022: Labs showed white blood cell 10.8, hemoglobin 12.9, MCV 87, platelets 291, with monocytes of 1.0 (nml 0.1-0.9) and eosinophils of 0.6 (nm 0.0-0.4). 10/03/2022: establish care with Dr. Leonides Schanz   CHIEF COMPLAINTS/PURPOSE OF CONSULTATION:  "Elevated Monocytes/Eosinophils "  HISTORY OF PRESENTING ILLNESS:  [name] 57 y.o. female with medical history significant for eczema, GERD, hypertension, and obesity who presents for evaluation of a mildly elevated monocytes and eosinophils.  On review of the previous records Ms. Chenevert had labs drawn with her primary care provider on 08/28/2022.  The labs showed white blood cell 10.8, hemoglobin 12.9, MCV 87, platelets 291, with monocytes of 1.0 (nml 0.1-0.9) and eosinophils of 0.6 (nm 0.0-0.4).  Due to concern for these findings the patient was referred to hematology for further evaluation and management.  On exam today Ms. Presley reports that she has no history of any inflammatory or rheumatological disorders.  She has had no recent runny nose, sore throat, or cough.  She denies any fevers, chills, sweats.  She reports she has not recently started any new medications.  She notes her energy is good and her appetite is strong.  She did start Mounjaro and has lost 30 pounds going from 248 pounds down to 217 pounds.  She reports she is never had any problems before with her blood such as iron deficiency anemia.  She reports that she is going through menopause "right now".  She does have a Nexplanon in place which has "expired".  On further discussion the patient reports that her father has hypertension and her mother has hypertension and diabetes.  She reports that her paternal grandfather died of a heart attack in her  maternal grandfather died of dementia.  Her paternal grandmother died of cirrhosis which "runs in the family".  She notes that she has 1 healthy child.  She never smoked and drinks about 2 alcoholic beverages per month.  She currently works in Presenter, broadcasting for [employer].  She notes that she does have some issues with allergies for which she takes Zyrtec, Claritin, and Flonase.  Otherwise she has been her baseline level of health and has no questions concerns or complaints today.  A full 10 point ROS is otherwise negative.  MEDICAL HISTORY:  Past Medical History:  Diagnosis Date   Cough    taking Sudafed as needed.Iophen C as needed also for cough.No fever   DIAB W/UNSPEC COMP TYPE II/UNSPEC TYPE UNCNTRL 08/24/2008   takes Metformin,CInnamon,and Glipizide daily   Eczema    Family history of adverse reaction to anesthesia    dad slow to wake up   GASTROESOPHAGEAL REFLUX, NO ESOPHAGITIS 03/18/2006   doesn't take any meds   History of blood clots 8+ yrs ago   right calf   HYPERTENSION, BENIGN SYSTEMIC 03/18/2006   takes Lisinopril-HCTZ daily   Joint pain    Joint swelling    OBESITY, NOS 03/18/2006   PONV (postoperative nausea and vomiting)    Seasonal allergies    takes Zyrtec and Claritin daily as well as Fluticasone    SURGICAL HISTORY: Past Surgical History:  Procedure Laterality Date   ABLATION     BREAST BIOPSY Left 10/30/2015   CHOLECYSTECTOMY     ORIF ANKLE FRACTURE Right 02/01/2015   Procedure: OPEN REDUCTION INTERNAL FIXATION RIGHT ANKLE  FRACTURE;  Surgeon: Kathryne Hitch, MD;  Location: [facility] OR;  Service: Orthopedics;  Laterality: Right;   TUBAL LIGATION  2004    SOCIAL HISTORY: Social History   Socioeconomic History   Marital status: Single    Spouse name: Not on file   Number of children: Not on file   Years of education: Not on file   Highest education level: Not on file  Occupational History   Not on file  Tobacco Use   Smoking status: Never    Smokeless tobacco: Never  Substance and Sexual Activity   Alcohol use: No   Drug use: No   Sexual activity: Yes    Birth control/protection: Surgical  Other Topics Concern   Not on file  Social History Narrative   Not on file   Social Determinants of Health   Financial Resource Strain: Not on file  Food Insecurity: Food Insecurity Present (10/02/2022)   Hunger Vital Sign    Worried About Running Out of Food in the Last Year: Never true    Ran Out of Food in the Last Year: Sometimes true  Transportation Needs: No Transportation Needs (10/02/2022)   PRAPARE - Administrator, Civil Service (Medical): No    Lack of Transportation (Non-Medical): No  Physical Activity: Not on file  Stress: Not on file  Social Connections: Not on file  Intimate Partner Violence: Not At Risk (10/02/2022)   Humiliation, Afraid, Rape, and Kick questionnaire    Fear of Current or Ex-Partner: No    Emotionally Abused: No    Physically Abused: No    Sexually Abused: No    FAMILY HISTORY: Family History  Problem Relation Age of Onset   Hypertension Mother    Diabetes Mother    Hypertension Father     ALLERGIES:  is allergic to trulicity [dulaglutide].  MEDICATIONS:  Current Outpatient Medications  Medication Sig Dispense Refill   blood glucose meter kit and supplies Dispense based on patient and insurance preference. Use up to four times daily as directed. (FOR ICD-10 E10.9, E11.9). 1 each 0   cetirizine (ZYRTEC) 10 MG tablet Take 1 tablet (10 mg total) by mouth daily. (Patient taking differently: Take 10 mg by mouth at bedtime. ) 30 tablet 11   fluticasone (FLONASE) 50 MCG/ACT nasal spray Place 2 sprays into both nostrils daily. 32 g 11   glucose blood (ONETOUCH VERIO) test strip 2x daily 100 each 12   JARDIANCE 25 MG TABS tablet Take 25 mg by mouth every morning.     lisinopril-hydrochlorothiazide (ZESTORETIC) 20-25 MG tablet TAKE 1 TABLET BY MOUTH DAILY 90 tablet 0   loratadine  (CLARITIN) 10 MG tablet Take 10 mg by mouth daily.     tirzepatide (MOUNJARO) 10 MG/0.5ML Pen Inject 10 mg into the skin once a week. 2 mL 0   triamcinolone ointment (KENALOG) 0.5 % APPLY EXTERNALLY TO THE AFFECTED AREA TWICE DAILY 30 g 0   vitamin C (ASCORBIC ACID) 500 MG tablet Take 500 mg by mouth daily.     No current facility-administered medications for this visit.    REVIEW OF SYSTEMS:   Constitutional: ( - ) fevers, ( - )  chills , ( - ) night sweats Eyes: ( - ) blurriness of vision, ( - ) double vision, ( - ) watery eyes Ears, nose, mouth, throat, and face: ( - ) mucositis, ( - ) sore throat Respiratory: ( - ) cough, ( - ) dyspnea, ( - ) wheezes Cardiovascular: ( - )  palpitation, ( - ) chest discomfort, ( - ) lower extremity swelling Gastrointestinal:  ( - ) nausea, ( - ) heartburn, ( - ) change in bowel habits Skin: ( - ) abnormal skin rashes Lymphatics: ( - ) new lymphadenopathy, ( - ) easy bruising Neurological: ( - ) numbness, ( - ) tingling, ( - ) new weaknesses Behavioral/Psych: ( - ) mood change, ( - ) new changes  All other systems were reviewed with the patient and are negative.  PHYSICAL EXAMINATION:  Vitals:   10/02/22 1339  BP: 102/74  Pulse: (!) 105  Resp: 17  Temp: 98.1 F (36.7 C)   Filed Weights   10/02/22 1339  Weight: 217 lb 6.4 oz (98.6 kg)    GENERAL: well appearing middle-aged African-American female in NAD  SKIN: skin color, texture, turgor are normal, no rashes or significant lesions EYES: conjunctiva are pink and non-injected, sclera clear LUNGS: clear to auscultation and percussion with normal breathing effort HEART: regular rate & rhythm and no murmurs and no lower extremity edema Musculoskeletal: no cyanosis of digits and no clubbing  PSYCH: alert & oriented x 3, fluent speech NEURO: no focal motor/sensory deficits  LABORATORY DATA:  I have reviewed the data as listed    Latest Ref Rng & Units 10/02/2022    2:39 PM 11/18/2018     9:16 AM 12/14/2016    8:50 AM  CBC  WBC 4.0 - 10.5 K/uL 10.8  12.6  10.7   Hemoglobin 12.0 - 15.0 g/dL 40.9  81.1  91.4   Hematocrit 36.0 - 46.0 % 38.9  35.7  36.4   Platelets 150 - 400 K/uL 251  308.0  259.0        Latest Ref Rng & Units 10/02/2022    2:39 PM 11/18/2018    9:16 AM 10/29/2017   11:11 AM  CMP  Glucose 70 - 99 mg/dL 782  956  213   BUN 6 - 20 mg/dL 22  19  16    Creatinine 0.44 - 1.00 mg/dL 0.86  5.78  4.69   Sodium 135 - 145 mmol/L 139  130  137   Potassium 3.5 - 5.1 mmol/L 3.8  4.4  4.0   Chloride 98 - 111 mmol/L 103  99  100   CO2 22 - 32 mmol/L 29  23  30    Calcium 8.9 - 10.3 mg/dL 9.6  9.5  62.9   Total Protein 6.5 - 8.1 g/dL 7.4  7.4  8.0   Total Bilirubin 0.3 - 1.2 mg/dL 0.2  0.3  0.2   Alkaline Phos 38 - 126 U/L 129  117  116   AST 15 - 41 U/L 17  26  21    ALT 0 - 44 U/L 19  33  35      ASSESSMENT & PLAN MAREYA RANK 57 y.o. female with medical history significant for eczema, GERD, hypertension, and obesity who presents for evaluation of a mildly elevated monocytes and eosinophils.  After review of the labs, review of the records, and discussion with the patient the patients findings are most consistent with elevated monocytes and eosinophils.  There are multiple possible etiologies for these elevations.  Of note these were mild elevations and do not likely represent bone marrow disorders or underlying hematological issues.  Inflammation can certainly elevate monocytes and eosinophils, particularly when secondary to allergies or infection.  Will order ESR and CRP in order to rule out an laboratory etiology.  The patient voiced  understanding of our findings and the plan moving forward.  # Elevated Monocytes/Eosinophils -- At this time findings are consistent with a mild elevation in monocytes and eosinophils.  These are both above the reference range on her last labs with her PCP. -- Today we will repeat CBC, CMP, LDH, as well as ordering inflammatory  markers with ESR and CRP. -- Will order antibodies for strongyloidies infection -- Findings are relatively modest and may represent response to baseline inflammation/allergies.  No clear indication this represents an underlying hematological disorder or myeloproliferative neoplasm. -- Return to clinic pending the results of the above studies.  Orders Placed This Encounter  Procedures   CBC with Differential (Cancer Center Only)    Standing Status:   Future    Number of Occurrences:   1    Standing Expiration Date:   10/02/2023   CMP (Cancer Center only)    Standing Status:   Future    Number of Occurrences:   1    Standing Expiration Date:   10/02/2023   Lactate dehydrogenase (LDH)    Standing Status:   Future    Number of Occurrences:   1    Standing Expiration Date:   10/02/2023   Sedimentation rate    Standing Status:   Future    Number of Occurrences:   1    Standing Expiration Date:   10/02/2023   C-reactive protein    Standing Status:   Future    Number of Occurrences:   1    Standing Expiration Date:   10/02/2023   Strongyloides, Ab, IgG    All questions were answered. The patient knows to call the clinic with any problems, questions or concerns.  A total of more than 45 minutes were spent on this encounter with face-to-face time and non-face-to-face time, including preparing to see the patient, ordering tests and/or medications, counseling the patient and coordination of care as outlined above.   Ulysees Barns, MD Department of Hematology/Oncology [facility] at [facility] Phone: [phone] Pager: [phone] Email: [email or web address]  10/03/2022 2:52 PM

## 2022-10-05 ENCOUNTER — Telehealth: Payer: Self-pay | Admitting: *Deleted

## 2022-10-05 LAB — STRONGYLOIDES, AB, IGG: Strongyloides, Ab, IgG: NEGATIVE

## 2022-10-05 NOTE — Telephone Encounter (Signed)
TCT patient regarding recent lab results.  No answer but was able to leave vm message for pt to return this call at her convenience to [phone]

## 2022-10-05 NOTE — Telephone Encounter (Signed)
-----   Message from Ulysees Barns IV sent at 10/03/2022  2:53 PM EDT ----- Please let [name] know that her labs showed her monocytes and eosinophils normalized.  They are at the higher end of normal but are normal.  She did have an elevation in her ESR which implies there may be some underlying inflammation.  This may be sporadic or due to an underlying undiagnosed rheumatological condition.  Please fax our clinic note to her primary care provider and make him aware that a rheumatological evaluation may be merited for her ESR.  There is no need for routine follow-up in our clinic. ----- Message ----- From: Interface, Lab In [place] Sent: 10/02/2022   2:54 PM EDT To: Jaci Standard, MD

## 2022-10-08 ENCOUNTER — Telehealth: Payer: Self-pay | Admitting: *Deleted

## 2022-10-08 NOTE — Telephone Encounter (Signed)
-----   Message from Ulysees Barns IV sent at 10/03/2022  2:53 PM EDT ----- Please let [name] know that her labs showed her monocytes and eosinophils normalized.  They are at the higher end of normal but are normal.  She did have an elevation in her ESR which implies there may be some underlying inflammation.  This may be sporadic or due to an underlying undiagnosed rheumatological condition.  Please fax our clinic note to her primary care provider and make him aware that a rheumatological evaluation may be merited for her ESR.  There is no need for routine follow-up in our clinic. ----- Message ----- From: Interface, Lab In [place] Sent: 10/02/2022   2:54 PM EDT To: Jaci Standard, MD

## 2022-10-08 NOTE — Telephone Encounter (Signed)
TCT patient regarding recent lab results. Spoke to her. Advised that that her labs showed her monocytes and eosinophils normalized. They are at the higher end of normal but are normal. She did have an elevation in her ESR which implies there may be some underlying inflammation. This may be sporadic or due to an underlying undiagnosed rheumatological condition. Please fax our clinic note to her primary care provider and make him aware that a rheumatological evaluation may be merited for her ESR. There is no need for routine follow-up in our clinic. Pt voiced understanding. Office note and recent labs fax'd to pt's PCP, Richardo Priest, NP

## 2022-10-14 ENCOUNTER — Other Ambulatory Visit (HOSPITAL_COMMUNITY): Payer: Self-pay

## 2022-10-14 MED ORDER — MOUNJARO 12.5 MG/0.5ML ~~LOC~~ SOAJ
12.5000 mg | SUBCUTANEOUS | 0 refills | Status: DC
Start: 2022-10-14 — End: 2022-11-20
  Filled 2022-10-14: qty 2, 28d supply, fill #0

## 2022-10-21 ENCOUNTER — Other Ambulatory Visit (HOSPITAL_COMMUNITY): Payer: Self-pay

## 2022-10-29 ENCOUNTER — Encounter: Payer: Self-pay | Admitting: Adult Medicine

## 2022-11-20 ENCOUNTER — Other Ambulatory Visit (HOSPITAL_COMMUNITY): Payer: Self-pay

## 2022-11-20 MED ORDER — MOUNJARO 12.5 MG/0.5ML ~~LOC~~ SOAJ
12.5000 mg | SUBCUTANEOUS | 0 refills | Status: DC
  Filled 2022-11-20 – 2022-12-02 (×2): qty 2, 28d supply, fill #0

## 2022-11-30 ENCOUNTER — Other Ambulatory Visit (HOSPITAL_COMMUNITY): Payer: Self-pay

## 2022-12-02 ENCOUNTER — Other Ambulatory Visit (HOSPITAL_COMMUNITY): Payer: Self-pay

## 2023-01-08 ENCOUNTER — Other Ambulatory Visit (HOSPITAL_COMMUNITY): Payer: Self-pay

## 2023-01-08 MED ORDER — MOUNJARO 12.5 MG/0.5ML ~~LOC~~ SOAJ
12.5000 mg | SUBCUTANEOUS | 0 refills | Status: DC
  Filled 2023-01-08: qty 2, 28d supply, fill #0

## 2023-03-03 ENCOUNTER — Other Ambulatory Visit (HOSPITAL_COMMUNITY): Payer: Self-pay

## 2023-03-03 MED ORDER — MOUNJARO 12.5 MG/0.5ML ~~LOC~~ SOAJ
SUBCUTANEOUS | 2 refills | Status: AC
  Filled 2023-03-03: qty 2, 28d supply, fill #0

## 2023-03-10 ENCOUNTER — Other Ambulatory Visit (HOSPITAL_COMMUNITY): Payer: Self-pay

## 2023-04-20 ENCOUNTER — Other Ambulatory Visit (HOSPITAL_COMMUNITY): Payer: Self-pay

## 2023-04-20 MED ORDER — MOUNJARO 12.5 MG/0.5ML ~~LOC~~ SOAJ
12.5000 mg | SUBCUTANEOUS | 0 refills | Status: AC
  Filled 2023-04-20: qty 2, 28d supply, fill #0

## 2023-06-10 ENCOUNTER — Other Ambulatory Visit (HOSPITAL_COMMUNITY): Payer: Self-pay

## 2023-06-10 MED ORDER — MOUNJARO 15 MG/0.5ML ~~LOC~~ SOAJ
15.0000 mg | SUBCUTANEOUS | 2 refills | Status: AC
  Filled 2023-06-10: qty 2, 28d supply, fill #0

## 2023-07-12 ENCOUNTER — Other Ambulatory Visit (HOSPITAL_COMMUNITY): Payer: Self-pay

## 2023-07-12 MED ORDER — MOUNJARO 15 MG/0.5ML ~~LOC~~ SOAJ
15.0000 mg | SUBCUTANEOUS | 3 refills | Status: AC
Start: 2023-07-12 — End: ?
  Filled 2023-07-12: qty 2, 28d supply, fill #0
  Filled 2023-08-18: qty 2, 28d supply, fill #1

## 2023-08-18 ENCOUNTER — Other Ambulatory Visit (HOSPITAL_COMMUNITY): Payer: Self-pay

## 2023-09-29 ENCOUNTER — Other Ambulatory Visit (HOSPITAL_COMMUNITY): Payer: Self-pay

## 2023-09-29 MED ORDER — MOUNJARO 15 MG/0.5ML ~~LOC~~ SOAJ
15.0000 mg | SUBCUTANEOUS | 3 refills | Status: AC
  Filled 2023-09-29 – 2023-10-13 (×2): qty 2, 28d supply, fill #0

## 2023-10-12 ENCOUNTER — Other Ambulatory Visit (HOSPITAL_COMMUNITY): Payer: Self-pay

## 2023-10-13 ENCOUNTER — Other Ambulatory Visit (HOSPITAL_COMMUNITY): Payer: Self-pay

## 2023-11-19 ENCOUNTER — Other Ambulatory Visit (HOSPITAL_COMMUNITY): Payer: Self-pay

## 2023-11-19 MED ORDER — MOUNJARO 15 MG/0.5ML ~~LOC~~ SOAJ
15.0000 mg | SUBCUTANEOUS | 3 refills | Status: AC
  Filled 2023-11-19 – 2023-12-01 (×2): qty 2, 28d supply, fill #0

## 2023-11-28 ENCOUNTER — Other Ambulatory Visit (HOSPITAL_COMMUNITY): Payer: Self-pay

## 2023-12-01 ENCOUNTER — Other Ambulatory Visit (HOSPITAL_COMMUNITY): Payer: Self-pay

## 2024-01-07 ENCOUNTER — Other Ambulatory Visit (HOSPITAL_COMMUNITY): Payer: Self-pay

## 2024-01-07 MED ORDER — MOUNJARO 15 MG/0.5ML ~~LOC~~ SOAJ
15.0000 mg | SUBCUTANEOUS | 4 refills | Status: AC
  Filled 2024-01-07 – 2024-01-19 (×2): qty 2, 28d supply, fill #0

## 2024-01-17 ENCOUNTER — Other Ambulatory Visit (HOSPITAL_COMMUNITY): Payer: Self-pay

## 2024-01-19 ENCOUNTER — Other Ambulatory Visit (HOSPITAL_COMMUNITY): Payer: Self-pay
